# Patient Record
Sex: Female | Born: 1985 | Race: White | Hispanic: No | Marital: Married | Smoking: Current every day smoker
Health system: Southern US, Community
[De-identification: ages and names within clinical notes are randomized; demographics above are authoritative.]

## PROBLEM LIST (undated history)

## (undated) ENCOUNTER — Inpatient Hospital Stay (HOSPITAL_COMMUNITY): Payer: Self-pay

## (undated) DIAGNOSIS — I10 Essential (primary) hypertension: Secondary | ICD-10-CM

## (undated) DIAGNOSIS — E079 Disorder of thyroid, unspecified: Secondary | ICD-10-CM

## (undated) DIAGNOSIS — O10919 Unspecified pre-existing hypertension complicating pregnancy, unspecified trimester: Secondary | ICD-10-CM

## (undated) DIAGNOSIS — B20 Human immunodeficiency virus [HIV] disease: Secondary | ICD-10-CM

## (undated) DIAGNOSIS — E039 Hypothyroidism, unspecified: Secondary | ICD-10-CM

## (undated) DIAGNOSIS — F419 Anxiety disorder, unspecified: Secondary | ICD-10-CM

## (undated) DIAGNOSIS — F1011 Alcohol abuse, in remission: Secondary | ICD-10-CM

## (undated) DIAGNOSIS — Z21 Asymptomatic human immunodeficiency virus [HIV] infection status: Secondary | ICD-10-CM

## (undated) DIAGNOSIS — O10912 Unspecified pre-existing hypertension complicating pregnancy, second trimester: Secondary | ICD-10-CM

## (undated) DIAGNOSIS — O899 Complication of anesthesia during the puerperium, unspecified: Secondary | ICD-10-CM

## (undated) DIAGNOSIS — A539 Syphilis, unspecified: Secondary | ICD-10-CM

## (undated) DIAGNOSIS — R112 Nausea with vomiting, unspecified: Secondary | ICD-10-CM

## (undated) DIAGNOSIS — E669 Obesity, unspecified: Secondary | ICD-10-CM

## (undated) DIAGNOSIS — O24419 Gestational diabetes mellitus in pregnancy, unspecified control: Secondary | ICD-10-CM

## (undated) DIAGNOSIS — T4145XA Adverse effect of unspecified anesthetic, initial encounter: Secondary | ICD-10-CM

## (undated) DIAGNOSIS — T8859XA Other complications of anesthesia, initial encounter: Secondary | ICD-10-CM

## (undated) DIAGNOSIS — A6000 Herpesviral infection of urogenital system, unspecified: Secondary | ICD-10-CM

## (undated) DIAGNOSIS — Z9889 Other specified postprocedural states: Secondary | ICD-10-CM

## (undated) HISTORY — DX: Anxiety disorder, unspecified: F41.9

## (undated) HISTORY — DX: Herpesviral infection of urogenital system, unspecified: A60.00

## (undated) HISTORY — DX: Essential (primary) hypertension: I10

## (undated) HISTORY — DX: Gestational diabetes mellitus in pregnancy, unspecified control: O24.419

## (undated) HISTORY — PX: TUBAL LIGATION: SHX77

## (undated) HISTORY — DX: Alcohol abuse, in remission: F10.11

## (undated) HISTORY — DX: Obesity, unspecified: E66.9

## (undated) HISTORY — DX: Human immunodeficiency virus (HIV) disease: B20

## (undated) HISTORY — DX: Unspecified pre-existing hypertension complicating pregnancy, unspecified trimester: O10.919

## (undated) HISTORY — DX: Disorder of thyroid, unspecified: E07.9

## (undated) HISTORY — DX: Unspecified pre-existing hypertension complicating pregnancy, second trimester: O10.912

## (undated) HISTORY — DX: Asymptomatic human immunodeficiency virus (hiv) infection status: Z21

## (undated) HISTORY — DX: Syphilis, unspecified: A53.9

## (undated) HISTORY — PX: WISDOM TOOTH EXTRACTION: SHX21

---
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
## 2005-04-06 ENCOUNTER — Emergency Department (HOSPITAL_COMMUNITY): Admission: EM | Admit: 2005-04-06 | Discharge: 2005-04-07 | Payer: Self-pay | Admitting: Emergency Medicine

## 2005-10-22 ENCOUNTER — Encounter (INDEPENDENT_AMBULATORY_CARE_PROVIDER_SITE_OTHER): Payer: Self-pay | Admitting: *Deleted

## 2005-10-22 LAB — CONVERTED CEMR LAB

## 2005-10-23 ENCOUNTER — Inpatient Hospital Stay (HOSPITAL_COMMUNITY): Admission: AD | Admit: 2005-10-23 | Discharge: 2005-10-23 | Payer: Self-pay | Admitting: *Deleted

## 2005-11-06 DIAGNOSIS — B20 Human immunodeficiency virus [HIV] disease: Secondary | ICD-10-CM | POA: Insufficient documentation

## 2005-11-07 ENCOUNTER — Ambulatory Visit (HOSPITAL_COMMUNITY): Admission: RE | Admit: 2005-11-07 | Discharge: 2005-11-07 | Payer: Self-pay | Admitting: *Deleted

## 2005-11-23 ENCOUNTER — Ambulatory Visit: Payer: Self-pay | Admitting: Infectious Diseases

## 2005-11-23 ENCOUNTER — Encounter (INDEPENDENT_AMBULATORY_CARE_PROVIDER_SITE_OTHER): Payer: Self-pay | Admitting: *Deleted

## 2005-11-23 LAB — CONVERTED CEMR LAB
CD4 T Cell Abs: 390
HIV 1 RNA Quant: 2010 copies/mL

## 2005-11-29 ENCOUNTER — Ambulatory Visit: Payer: Self-pay | Admitting: Family Medicine

## 2005-11-30 ENCOUNTER — Ambulatory Visit: Payer: Self-pay | Admitting: *Deleted

## 2005-12-05 ENCOUNTER — Ambulatory Visit: Payer: Self-pay | Admitting: Infectious Diseases

## 2005-12-05 ENCOUNTER — Encounter (INDEPENDENT_AMBULATORY_CARE_PROVIDER_SITE_OTHER): Payer: Self-pay | Admitting: *Deleted

## 2005-12-13 ENCOUNTER — Ambulatory Visit: Payer: Self-pay | Admitting: Family Medicine

## 2005-12-18 ENCOUNTER — Ambulatory Visit: Payer: Self-pay | Admitting: *Deleted

## 2005-12-19 ENCOUNTER — Ambulatory Visit: Payer: Self-pay | Admitting: Family Medicine

## 2005-12-19 ENCOUNTER — Ambulatory Visit (HOSPITAL_COMMUNITY): Admission: RE | Admit: 2005-12-19 | Discharge: 2005-12-19 | Payer: Self-pay | Admitting: *Deleted

## 2005-12-27 ENCOUNTER — Ambulatory Visit: Payer: Self-pay | Admitting: Gynecology

## 2006-01-01 ENCOUNTER — Inpatient Hospital Stay (HOSPITAL_COMMUNITY): Admission: AD | Admit: 2006-01-01 | Discharge: 2006-01-01 | Payer: Self-pay | Admitting: Family Medicine

## 2006-01-03 ENCOUNTER — Ambulatory Visit: Payer: Self-pay | Admitting: Family Medicine

## 2006-01-03 ENCOUNTER — Inpatient Hospital Stay (HOSPITAL_COMMUNITY): Admission: AD | Admit: 2006-01-03 | Discharge: 2006-01-09 | Payer: Self-pay | Admitting: *Deleted

## 2006-01-03 ENCOUNTER — Ambulatory Visit: Payer: Self-pay | Admitting: *Deleted

## 2006-01-04 ENCOUNTER — Ambulatory Visit: Payer: Self-pay | Admitting: Infectious Diseases

## 2006-01-14 ENCOUNTER — Ambulatory Visit: Payer: Self-pay | Admitting: Family Medicine

## 2006-01-17 ENCOUNTER — Inpatient Hospital Stay (HOSPITAL_COMMUNITY): Admission: AD | Admit: 2006-01-17 | Discharge: 2006-01-17 | Payer: Self-pay | Admitting: Obstetrics & Gynecology

## 2006-01-17 ENCOUNTER — Ambulatory Visit: Payer: Self-pay | Admitting: Obstetrics and Gynecology

## 2006-01-21 ENCOUNTER — Ambulatory Visit (HOSPITAL_COMMUNITY): Admission: RE | Admit: 2006-01-21 | Discharge: 2006-01-21 | Payer: Self-pay | Admitting: *Deleted

## 2006-01-21 ENCOUNTER — Ambulatory Visit: Payer: Self-pay | Admitting: Family Medicine

## 2006-01-24 ENCOUNTER — Ambulatory Visit: Payer: Self-pay | Admitting: Family Medicine

## 2006-01-28 ENCOUNTER — Ambulatory Visit: Payer: Self-pay | Admitting: Family Medicine

## 2006-01-28 ENCOUNTER — Ambulatory Visit (HOSPITAL_COMMUNITY): Admission: RE | Admit: 2006-01-28 | Discharge: 2006-01-28 | Payer: Self-pay | Admitting: Obstetrics & Gynecology

## 2006-01-31 ENCOUNTER — Ambulatory Visit: Payer: Self-pay | Admitting: Obstetrics & Gynecology

## 2006-02-04 ENCOUNTER — Ambulatory Visit: Payer: Self-pay | Admitting: Obstetrics & Gynecology

## 2006-02-04 ENCOUNTER — Ambulatory Visit (HOSPITAL_COMMUNITY): Admission: RE | Admit: 2006-02-04 | Discharge: 2006-02-04 | Payer: Self-pay | Admitting: *Deleted

## 2006-02-05 ENCOUNTER — Ambulatory Visit: Payer: Self-pay | Admitting: Obstetrics and Gynecology

## 2006-02-07 ENCOUNTER — Ambulatory Visit: Payer: Self-pay | Admitting: Obstetrics & Gynecology

## 2006-02-14 ENCOUNTER — Ambulatory Visit: Payer: Self-pay | Admitting: Obstetrics & Gynecology

## 2006-02-18 ENCOUNTER — Inpatient Hospital Stay (HOSPITAL_COMMUNITY): Admission: AD | Admit: 2006-02-18 | Discharge: 2006-02-21 | Payer: Self-pay | Admitting: Obstetrics & Gynecology

## 2006-02-18 ENCOUNTER — Ambulatory Visit: Payer: Self-pay | Admitting: Gynecology

## 2006-02-19 ENCOUNTER — Encounter (INDEPENDENT_AMBULATORY_CARE_PROVIDER_SITE_OTHER): Payer: Self-pay | Admitting: Specialist

## 2006-02-25 ENCOUNTER — Ambulatory Visit: Payer: Self-pay | Admitting: Family Medicine

## 2006-02-28 ENCOUNTER — Ambulatory Visit: Payer: Self-pay | Admitting: Family Medicine

## 2006-03-03 ENCOUNTER — Inpatient Hospital Stay (HOSPITAL_COMMUNITY): Admission: AD | Admit: 2006-03-03 | Discharge: 2006-03-03 | Payer: Self-pay | Admitting: Obstetrics and Gynecology

## 2006-04-01 ENCOUNTER — Ambulatory Visit: Payer: Self-pay | Admitting: Family Medicine

## 2006-05-15 ENCOUNTER — Ambulatory Visit: Payer: Self-pay | Admitting: Sports Medicine

## 2006-05-22 ENCOUNTER — Ambulatory Visit: Payer: Self-pay | Admitting: Family Medicine

## 2006-06-05 ENCOUNTER — Ambulatory Visit: Payer: Self-pay | Admitting: Infectious Diseases

## 2006-06-05 ENCOUNTER — Encounter (INDEPENDENT_AMBULATORY_CARE_PROVIDER_SITE_OTHER): Payer: Self-pay | Admitting: *Deleted

## 2006-06-05 ENCOUNTER — Encounter: Admission: RE | Admit: 2006-06-05 | Discharge: 2006-06-05 | Payer: Self-pay | Admitting: Infectious Diseases

## 2006-11-12 DIAGNOSIS — O98519 Other viral diseases complicating pregnancy, unspecified trimester: Secondary | ICD-10-CM

## 2006-11-12 DIAGNOSIS — R87615 Unsatisfactory cytologic smear of cervix: Secondary | ICD-10-CM

## 2006-11-12 DIAGNOSIS — J329 Chronic sinusitis, unspecified: Secondary | ICD-10-CM | POA: Insufficient documentation

## 2006-11-21 ENCOUNTER — Ambulatory Visit: Payer: Self-pay | Admitting: Family Medicine

## 2006-12-06 DIAGNOSIS — I10 Essential (primary) hypertension: Secondary | ICD-10-CM

## 2006-12-06 DIAGNOSIS — E039 Hypothyroidism, unspecified: Secondary | ICD-10-CM | POA: Insufficient documentation

## 2006-12-16 ENCOUNTER — Encounter (INDEPENDENT_AMBULATORY_CARE_PROVIDER_SITE_OTHER): Payer: Self-pay | Admitting: *Deleted

## 2006-12-16 LAB — CONVERTED CEMR LAB

## 2006-12-20 ENCOUNTER — Encounter (INDEPENDENT_AMBULATORY_CARE_PROVIDER_SITE_OTHER): Payer: Self-pay | Admitting: *Deleted

## 2006-12-29 ENCOUNTER — Encounter (INDEPENDENT_AMBULATORY_CARE_PROVIDER_SITE_OTHER): Payer: Self-pay | Admitting: *Deleted

## 2006-12-30 ENCOUNTER — Telehealth (INDEPENDENT_AMBULATORY_CARE_PROVIDER_SITE_OTHER): Payer: Self-pay | Admitting: Infectious Diseases

## 2007-01-13 ENCOUNTER — Telehealth (INDEPENDENT_AMBULATORY_CARE_PROVIDER_SITE_OTHER): Payer: Self-pay | Admitting: *Deleted

## 2007-03-05 ENCOUNTER — Encounter: Admission: RE | Admit: 2007-03-05 | Discharge: 2007-03-05 | Payer: Self-pay | Admitting: Infectious Diseases

## 2007-03-05 ENCOUNTER — Ambulatory Visit: Payer: Self-pay | Admitting: Infectious Diseases

## 2007-03-05 LAB — CONVERTED CEMR LAB
ALT: 19 units/L (ref 0–35)
Alkaline Phosphatase: 68 units/L (ref 39–117)
BUN: 8 mg/dL (ref 6–23)
Creatinine, Ser: 0.67 mg/dL (ref 0.40–1.20)
HCT: 42.2 % (ref 36.0–46.0)
HIV 1 RNA Quant: <50 copies/mL (ref <50)
HIV-1 RNA Quant, Log: <1.7 (ref <1.70)
Hemoglobin: 13.5 g/dL (ref 12.0–15.0)
MCHC: 32 g/dL (ref 30.0–36.0)
MCV: 92.7 fL (ref 78.0–100.0)
RBC: 4.55 M/uL (ref 3.87–5.11)
TSH: 1.516 microintl units/mL (ref 0.350–5.50)
Total Protein: 7.5 g/dL (ref 6.0–8.3)
VLDL: 44 mg/dL — ABNORMAL HIGH (ref 0–40)
WBC: 5.9 10*3/uL (ref 4.0–10.5)

## 2007-03-10 ENCOUNTER — Telehealth: Payer: Self-pay | Admitting: *Deleted

## 2007-10-20 ENCOUNTER — Encounter: Payer: Self-pay | Admitting: Infectious Diseases

## 2007-11-24 ENCOUNTER — Encounter (INDEPENDENT_AMBULATORY_CARE_PROVIDER_SITE_OTHER): Payer: Self-pay | Admitting: *Deleted

## 2007-11-25 ENCOUNTER — Encounter: Admission: RE | Admit: 2007-11-25 | Discharge: 2007-11-25 | Payer: Self-pay | Admitting: Infectious Diseases

## 2007-11-25 ENCOUNTER — Ambulatory Visit: Payer: Self-pay | Admitting: Infectious Diseases

## 2007-11-25 LAB — CONVERTED CEMR LAB
Albumin: 4 g/dL (ref 3.5–5.2)
Alkaline Phosphatase: 65 units/L (ref 39–117)
CO2: 24 meq/L (ref 19–32)
Chloride: 105 meq/L (ref 96–112)
Cholesterol: 175 mg/dL (ref 0–200)
Glucose, Bld: 105 mg/dL — ABNORMAL HIGH (ref 70–99)
HDL: 37 mg/dL — ABNORMAL LOW (ref >39)
Hemoglobin: 13.4 g/dL (ref 12.0–15.0)
MCHC: 33.3 g/dL (ref 30.0–36.0)
Platelets: 247 10*3/uL (ref 150–400)
Potassium: 4.1 meq/L (ref 3.5–5.3)
RBC: 4.51 M/uL (ref 3.87–5.11)
RDW: 13.9 % (ref 11.5–15.5)
Total Bilirubin: 0.4 mg/dL (ref 0.3–1.2)
Total CHOL/HDL Ratio: 4.7
VLDL: 60 mg/dL — ABNORMAL HIGH (ref 0–40)
WBC: 5.8 10*3/uL (ref 4.0–10.5)

## 2007-11-26 ENCOUNTER — Encounter (INDEPENDENT_AMBULATORY_CARE_PROVIDER_SITE_OTHER): Payer: Self-pay | Admitting: *Deleted

## 2008-01-16 ENCOUNTER — Encounter: Payer: Self-pay | Admitting: Infectious Diseases

## 2008-01-16 ENCOUNTER — Ambulatory Visit: Payer: Self-pay | Admitting: Infectious Diseases

## 2008-01-23 ENCOUNTER — Encounter: Payer: Self-pay | Admitting: Infectious Diseases

## 2008-01-25 ENCOUNTER — Emergency Department (HOSPITAL_COMMUNITY): Admission: EM | Admit: 2008-01-25 | Discharge: 2008-01-25 | Payer: Self-pay | Admitting: Family Medicine

## 2008-02-04 ENCOUNTER — Ambulatory Visit: Payer: Self-pay | Admitting: Infectious Diseases

## 2008-02-04 ENCOUNTER — Encounter: Admission: RE | Admit: 2008-02-04 | Discharge: 2008-02-04 | Payer: Self-pay | Admitting: Infectious Diseases

## 2008-02-04 LAB — CONVERTED CEMR LAB
ALT: 16 units/L (ref 0–35)
AST: 16 units/L (ref 0–37)
Basophils Absolute: 0 10*3/uL (ref 0.0–0.1)
Basophils Relative: 1 % (ref 0–1)
CO2: 27 meq/L (ref 19–32)
Calcium: 8.9 mg/dL (ref 8.4–10.5)
Chloride: 103 meq/L (ref 96–112)
Creatinine, Ser: 0.67 mg/dL (ref 0.40–1.20)
Eosinophils Absolute: 0.2 10*3/uL (ref 0.0–0.7)
Eosinophils Relative: 3 % (ref 0–5)
HCT: 39.9 % (ref 36.0–46.0)
Hemoglobin: 12.8 g/dL (ref 12.0–15.0)
MCHC: 32.1 g/dL (ref 30.0–36.0)
MCV: 88.3 fL (ref 78.0–100.0)
Potassium: 4.6 meq/L (ref 3.5–5.3)
RBC: 4.52 M/uL (ref 3.87–5.11)
RDW: 14.4 % (ref 11.5–15.5)
Sodium: 143 meq/L (ref 135–145)
Total Protein: 7.3 g/dL (ref 6.0–8.3)
WBC: 6.5 10*3/uL (ref 4.0–10.5)

## 2008-02-24 ENCOUNTER — Encounter: Admission: RE | Admit: 2008-02-24 | Discharge: 2008-02-24 | Payer: Self-pay | Admitting: Infectious Diseases

## 2008-02-24 ENCOUNTER — Ambulatory Visit: Payer: Self-pay | Admitting: Infectious Diseases

## 2008-02-24 LAB — CONVERTED CEMR LAB
Albumin: 4 g/dL (ref 3.5–5.2)
Alkaline Phosphatase: 56 units/L (ref 39–117)
BUN: 10 mg/dL (ref 6–23)
CO2: 26 meq/L (ref 19–32)
Glucose, Bld: 101 mg/dL — ABNORMAL HIGH (ref 70–99)
HIV 1 RNA Quant: 144 copies/mL — ABNORMAL HIGH (ref <50)
HIV-1 antibody: POSITIVE — AB
HIV-2 Ab: UNDETERMINED — AB
HIV: REACTIVE
Hemoglobin: 13.1 g/dL (ref 12.0–15.0)
MCHC: 32.3 g/dL (ref 30.0–36.0)
MCV: 86.9 fL (ref 78.0–100.0)
Potassium: 4.2 meq/L (ref 3.5–5.3)
RBC: 4.67 M/uL (ref 3.87–5.11)
Sodium: 142 meq/L (ref 135–145)
Total Protein: 7.2 g/dL (ref 6.0–8.3)
WBC: 6.1 10*3/uL (ref 4.0–10.5)

## 2008-03-02 ENCOUNTER — Ambulatory Visit: Payer: Self-pay | Admitting: Infectious Diseases

## 2008-03-19 ENCOUNTER — Emergency Department (HOSPITAL_COMMUNITY): Admission: EM | Admit: 2008-03-19 | Discharge: 2008-03-19 | Payer: Self-pay | Admitting: Emergency Medicine

## 2008-04-19 ENCOUNTER — Emergency Department (HOSPITAL_COMMUNITY): Admission: EM | Admit: 2008-04-19 | Discharge: 2008-04-19 | Payer: Self-pay | Admitting: Emergency Medicine

## 2008-06-07 ENCOUNTER — Ambulatory Visit: Payer: Self-pay | Admitting: Infectious Diseases

## 2008-06-07 ENCOUNTER — Encounter: Admission: RE | Admit: 2008-06-07 | Discharge: 2008-06-07 | Payer: Self-pay | Admitting: Infectious Diseases

## 2008-06-07 LAB — CONVERTED CEMR LAB
ALT: 24 units/L (ref 0–35)
AST: 20 units/L (ref 0–37)
Basophils Absolute: 0 10*3/uL (ref 0.0–0.1)
Basophils Relative: 1 % (ref 0–1)
CO2: 25 meq/L (ref 19–32)
Calcium: 8.6 mg/dL (ref 8.4–10.5)
Chloride: 102 meq/L (ref 96–112)
Creatinine, Ser: 0.72 mg/dL (ref 0.40–1.20)
HIV 1 RNA Quant: 1130 copies/mL — ABNORMAL HIGH (ref <50)
Hemoglobin: 13.5 g/dL (ref 12.0–15.0)
Lymphocytes Relative: 38 % (ref 12–46)
MCHC: 33.4 g/dL (ref 30.0–36.0)
Neutro Abs: 3 10*3/uL (ref 1.7–7.7)
Neutrophils Relative %: 51 % (ref 43–77)
Platelets: 185 10*3/uL (ref 150–400)
Potassium: 4.3 meq/L (ref 3.5–5.3)
RDW: 15.5 % (ref 11.5–15.5)
Sodium: 140 meq/L (ref 135–145)
Total Protein: 7.2 g/dL (ref 6.0–8.3)

## 2009-01-19 ENCOUNTER — Encounter: Payer: Self-pay | Admitting: Infectious Diseases

## 2009-03-23 ENCOUNTER — Emergency Department (HOSPITAL_COMMUNITY): Admission: EM | Admit: 2009-03-23 | Discharge: 2009-03-23 | Payer: Self-pay | Admitting: *Deleted

## 2009-04-21 ENCOUNTER — Ambulatory Visit: Payer: Self-pay | Admitting: Infectious Diseases

## 2009-04-21 LAB — CONVERTED CEMR LAB
AST: 22 units/L (ref 0–37)
Albumin: 3.9 g/dL (ref 3.5–5.2)
Alkaline Phosphatase: 50 units/L (ref 39–117)
BUN: 7 mg/dL (ref 6–23)
Calcium: 8.2 mg/dL — ABNORMAL LOW (ref 8.4–10.5)
Creatinine, Ser: 0.66 mg/dL (ref 0.40–1.20)
Eosinophils Relative: 2 % (ref 0–5)
Glucose, Bld: 109 mg/dL — ABNORMAL HIGH (ref 70–99)
HCT: 38.5 % (ref 36.0–46.0)
HIV 1 RNA Quant: 908 copies/mL — ABNORMAL HIGH (ref <48)
HIV-1 RNA Quant, Log: 2.96 — ABNORMAL HIGH (ref <1.68)
Hemoglobin: 12.6 g/dL (ref 12.0–15.0)
Lymphocytes Relative: 27 % (ref 12–46)
MCHC: 32.7 g/dL (ref 30.0–36.0)
Monocytes Absolute: 0.3 10*3/uL (ref 0.1–1.0)
Monocytes Relative: 7 % (ref 3–12)
Neutro Abs: 2.9 10*3/uL (ref 1.7–7.7)
Potassium: 3.9 meq/L (ref 3.5–5.3)
RBC: 4.51 M/uL (ref 3.87–5.11)

## 2010-03-27 ENCOUNTER — Inpatient Hospital Stay (HOSPITAL_COMMUNITY): Admission: AD | Admit: 2010-03-27 | Discharge: 2010-03-27 | Payer: Self-pay | Admitting: Family Medicine

## 2010-03-30 ENCOUNTER — Ambulatory Visit: Payer: Self-pay | Admitting: Infectious Diseases

## 2010-03-30 ENCOUNTER — Encounter (INDEPENDENT_AMBULATORY_CARE_PROVIDER_SITE_OTHER): Payer: Self-pay | Admitting: Licensed Clinical Social Worker

## 2010-04-17 ENCOUNTER — Ambulatory Visit: Payer: Self-pay | Admitting: Infectious Diseases

## 2010-04-17 LAB — CONVERTED CEMR LAB
Nitrite: NEGATIVE
pH: 5

## 2010-04-19 ENCOUNTER — Ambulatory Visit: Payer: Self-pay | Admitting: Obstetrics and Gynecology

## 2010-04-19 ENCOUNTER — Encounter (INDEPENDENT_AMBULATORY_CARE_PROVIDER_SITE_OTHER): Payer: Self-pay | Admitting: *Deleted

## 2010-04-19 ENCOUNTER — Ambulatory Visit: Payer: Self-pay | Admitting: Infectious Diseases

## 2010-04-19 LAB — CONVERTED CEMR LAB
Basophils Absolute: 0 10*3/uL (ref 0.0–0.1)
Basophils Relative: 0 % (ref 0–1)
Clue Cells Wet Prep HPF POC: NONE SEEN
Eosinophils Absolute: 0.1 10*3/uL (ref 0.0–0.7)
Free T4: 1.09 ng/dL (ref 0.80–1.80)
GC Probe Amp, Urine: NEGATIVE
MCHC: 31.7 g/dL (ref 30.0–36.0)
MCV: 88.4 fL (ref 78.0–100.0)
Neutro Abs: 4.2 10*3/uL (ref 1.7–7.7)
Neutrophils Relative %: 68 % (ref 43–77)
Pap Smear: NEGATIVE
Platelets: 194 10*3/uL (ref 150–400)
T3, Free: 2.6 pg/mL (ref 2.3–4.2)
TSH: 2.592 microintl units/mL (ref 0.350–4.500)
Trich, Wet Prep: NONE SEEN
Yeast Wet Prep HPF POC: NONE SEEN

## 2010-04-20 ENCOUNTER — Ambulatory Visit (HOSPITAL_COMMUNITY): Admission: RE | Admit: 2010-04-20 | Discharge: 2010-04-20 | Payer: Self-pay | Admitting: Obstetrics & Gynecology

## 2010-04-26 ENCOUNTER — Encounter (INDEPENDENT_AMBULATORY_CARE_PROVIDER_SITE_OTHER): Payer: Self-pay | Admitting: *Deleted

## 2010-05-04 ENCOUNTER — Ambulatory Visit: Payer: Self-pay | Admitting: Family Medicine

## 2010-05-18 ENCOUNTER — Ambulatory Visit: Payer: Self-pay | Admitting: Obstetrics and Gynecology

## 2010-05-18 ENCOUNTER — Encounter (INDEPENDENT_AMBULATORY_CARE_PROVIDER_SITE_OTHER): Payer: Self-pay | Admitting: Family Medicine

## 2010-05-18 LAB — CONVERTED CEMR LAB
MCV: 87.9 fL (ref 78.0–100.0)
Platelets: 218 10*3/uL (ref 150–400)
WBC: 7.5 10*3/uL (ref 4.0–10.5)

## 2010-05-20 ENCOUNTER — Inpatient Hospital Stay (HOSPITAL_COMMUNITY): Admission: AD | Admit: 2010-05-20 | Discharge: 2010-05-20 | Payer: Self-pay | Admitting: Obstetrics & Gynecology

## 2010-05-20 ENCOUNTER — Ambulatory Visit: Payer: Self-pay | Admitting: Nurse Practitioner

## 2010-05-25 ENCOUNTER — Ambulatory Visit: Payer: Self-pay | Admitting: Obstetrics and Gynecology

## 2010-05-25 ENCOUNTER — Ambulatory Visit (HOSPITAL_COMMUNITY): Admission: RE | Admit: 2010-05-25 | Discharge: 2010-05-25 | Payer: Self-pay | Admitting: Obstetrics & Gynecology

## 2010-05-31 ENCOUNTER — Encounter: Payer: Self-pay | Admitting: Family Medicine

## 2010-05-31 ENCOUNTER — Encounter: Payer: Self-pay | Admitting: Obstetrics and Gynecology

## 2010-05-31 LAB — CONVERTED CEMR LAB
AST: 11 units/L (ref 0–37)
Albumin: 3.2 g/dL — ABNORMAL LOW (ref 3.5–5.2)
BUN: 6 mg/dL (ref 6–23)
Calcium: 8.8 mg/dL (ref 8.4–10.5)
Chloride: 104 meq/L (ref 96–112)
Creatinine, Urine: 104.2 mg/dL
Hemoglobin: 10.9 g/dL — ABNORMAL LOW (ref 12.0–15.0)
Potassium: 3.9 meq/L (ref 3.5–5.3)
RBC: 3.86 M/uL — ABNORMAL LOW (ref 3.87–5.11)
RDW: 15.1 % (ref 11.5–15.5)
WBC: 5.3 10*3/uL (ref 4.0–10.5)

## 2010-06-08 ENCOUNTER — Ambulatory Visit: Payer: Self-pay | Admitting: Obstetrics and Gynecology

## 2010-06-09 ENCOUNTER — Telehealth (INDEPENDENT_AMBULATORY_CARE_PROVIDER_SITE_OTHER): Payer: Self-pay | Admitting: *Deleted

## 2010-06-12 ENCOUNTER — Encounter: Payer: Self-pay | Admitting: Infectious Diseases

## 2010-06-16 ENCOUNTER — Ambulatory Visit (HOSPITAL_COMMUNITY): Admission: RE | Admit: 2010-06-16 | Discharge: 2010-06-16 | Payer: Self-pay | Admitting: Family Medicine

## 2010-07-31 ENCOUNTER — Ambulatory Visit: Payer: Self-pay | Admitting: Obstetrics & Gynecology

## 2010-07-31 ENCOUNTER — Inpatient Hospital Stay (HOSPITAL_COMMUNITY): Admission: RE | Admit: 2010-07-31 | Discharge: 2010-08-03 | Payer: Self-pay | Admitting: Obstetrics & Gynecology

## 2010-10-04 ENCOUNTER — Encounter: Payer: Self-pay | Admitting: Family Medicine

## 2010-10-04 ENCOUNTER — Ambulatory Visit: Payer: Self-pay | Admitting: Obstetrics and Gynecology

## 2010-10-04 LAB — CONVERTED CEMR LAB
HCT: 37.1 % (ref 36.0–46.0)
Hemoglobin: 12 g/dL (ref 12.0–15.0)
MCHC: 32.3 g/dL (ref 30.0–36.0)
MCV: 84.5 fL (ref 78.0–100.0)
RBC: 4.39 M/uL (ref 3.87–5.11)
RDW: 15.1 % (ref 11.5–15.5)

## 2010-10-18 ENCOUNTER — Ambulatory Visit: Payer: Self-pay | Admitting: Obstetrics & Gynecology

## 2010-11-12 ENCOUNTER — Encounter: Payer: Self-pay | Admitting: *Deleted

## 2010-11-13 ENCOUNTER — Encounter (INDEPENDENT_AMBULATORY_CARE_PROVIDER_SITE_OTHER): Payer: Self-pay | Admitting: *Deleted

## 2010-11-17 ENCOUNTER — Encounter (INDEPENDENT_AMBULATORY_CARE_PROVIDER_SITE_OTHER): Payer: Self-pay | Admitting: *Deleted

## 2010-11-19 LAB — CONVERTED CEMR LAB
Albumin: 3.3 g/dL — ABNORMAL LOW (ref 3.5–5.2)
Alkaline Phosphatase: 45 units/L (ref 39–117)
CO2: 20 meq/L (ref 19–32)
Calcium: 8.5 mg/dL (ref 8.4–10.5)
Chloride: 104 meq/L (ref 96–112)
Glucose, Bld: 86 mg/dL (ref 70–99)
HCT: 34.9 % — ABNORMAL LOW (ref 36.0–46.0)
HIV 1 RNA Quant: 881 copies/mL — ABNORMAL HIGH (ref <48)
Hemoglobin: 12 g/dL (ref 12.0–15.0)
Lymphocytes Relative: 22 % (ref 12–46)
Lymphs Abs: 1.3 10*3/uL (ref 0.7–4.0)
Monocytes Absolute: 0.4 10*3/uL (ref 0.1–1.0)
Monocytes Relative: 7 % (ref 3–12)
Neutro Abs: 4.2 10*3/uL (ref 1.7–7.7)
Potassium: 4.3 meq/L (ref 3.5–5.3)
RBC: 4.09 M/uL (ref 3.87–5.11)
Sodium: 135 meq/L (ref 135–145)
Total Protein: 6.7 g/dL (ref 6.0–8.3)

## 2010-11-23 NOTE — Assessment & Plan Note (Signed)
Summary: 125  mg Rochepin injection/dde   Vitals Entered By: Jennet Maduro RN (April 19, 2010 3:46 PM)  Medical History Prior Medications: PRENATAL VITAMINS 0.8 MG TABS (PRENATAL MULTIVIT-MIN-FE-FA) Take 1 tablet by mouth once a day KALETRA 200-50 MG TABS (LOPINAVIR-RITONAVIR) 2 tab by mouth two times a day COMBIVIR 150-300 MG TABS (LAMIVUDINE-ZIDOVUDINE) Take 1 tablet by mouth two times a day Current Allergies: ! STEROIDS ! SULFA Medication Administration  Injection # 1:    Medication: Rocephin  250mg     Diagnosis: CONTACT WITH OR EXPOSURE TO VENEREAL DISEASES (ICD-V01.6)    Route: IM    Site: RUOQ gluteus    Exp Date: 09/22/2011    Lot #: [id number]    Mfr: sandoz    Comments: 125 mg of Rocephin per Dr. Ninetta Lights    Patient tolerated injection without complications    Given by: Jennet Maduro RN (April 19, 2010 3:49 PM)  Orders Added: 1)  Rocephin  250mg  [J0696] 2)  Admin of Therapeutic Inj  intramuscular or subcutaneous [96372]   Medication Administration  Injection # 1:    Medication: Rocephin  250mg     Diagnosis: CONTACT WITH OR EXPOSURE TO VENEREAL DISEASES (ICD-V01.6)    Route: IM    Site: RUOQ gluteus    Exp Date: 09/22/2011    Lot #: [id number]    Mfr: sandoz    Comments: 125 mg of Rocephin per Dr. Ninetta Lights    Patient tolerated injection without complications    Given by: Jennet Maduro RN (April 19, 2010 3:49 PM)  Orders Added: 1)  Rocephin  250mg  [J0696] 2)  Admin of Therapeutic Inj  intramuscular or subcutaneous [[id number]]

## 2010-11-23 NOTE — Miscellaneous (Signed)
  Clinical Lists Changes  Observations: Added new observation of #CHILDDEL: 1  (11/13/2010 14:59) Added new observation of DELIV TYPE: Cesarean  (11/13/2010 14:59)

## 2010-11-23 NOTE — Miscellaneous (Signed)
  Clinical Lists Changes 

## 2010-11-23 NOTE — Miscellaneous (Signed)
Summary: PAP Smear results 04/19/2010 [facility]  Clinical Lists Changes  Observations: Added new observation of PAP SMEAR: NEGATIVE (04/19/2010 9:44) Added new observation of LAST PAP DAT: 04/19/2010 (04/19/2010 9:44)

## 2010-11-23 NOTE — Miscellaneous (Signed)
Summary: Orders Update  Clinical Lists Changes  Orders: Added new Test order of T-CBC w/Diff [phone]) - Signed Added new Test order of T-CD4SP [facility]) (CD4SP) - Signed Added new Test order of T-Comprehensive Metabolic Panel [phone]) - Signed Added new Test order of T-HIV Viral Load [phone]) - Signed Added new Test order of T-Urinalysis ([id number]) - Signed Added new Test order of T-RPR (Syphilis) [phone]) - Signed Added new Test order of T-Chlamydia  Probe, urine [phone]) - Signed Added new Test order of T-GC Probe, urine [phone]) - Signed

## 2010-11-23 NOTE — Miscellaneous (Signed)
Summary: [facility]  [facility]   Imported By: Florinda Marker 06/12/2010 14:49:15  _____________________________________________________________________  External Attachment:    Type:   Image     Comment:   External Document

## 2010-11-23 NOTE — Progress Notes (Signed)
Summary: [facility] requesting OV notes and labs throughout Preg.  Phone Note From Other Clinic   Caller: [facility] Reason for Call: Need Referral Information Summary of Call: Requesting OV notes and Labs during pt. pregnancy.  Previous OV notes and Labs faxed.  To continue throughout pregancy.  Fax # [phone].  Jennet Maduro RN  June 09, 2010 9:00 AM

## 2010-11-23 NOTE — Assessment & Plan Note (Signed)
Summary: F/U [MKJ]   CC:  follow-up visit.  History of Present Illness: 25 yo F with morbid obesity and HIV+. She was dx 1-07 when she was pregnant. She delivered a HIV- infant. Was treated with combivir and kaletra around delivery, since was d/c 2-09.  She has been out of care for several years.   Labs done 02-04-08  CD4 620 VL 76 Current labs CD4 330 and VL 881 (03-30-10). Today states she is pregnant. SHe believes she is 24-25 weeks. feeling ok, lost job 2 weeks ago. has been of ART for a long time. wt down 25# since last visit. has GYN apt 04-19-10. her current sex partner had syphillis. she has gotten IM PEN. She is planning on getting married to him.   Preventive Screening-Counseling & Management  Alcohol-Tobacco     Alcohol drinks/day: 0     Smoking Status: current     Packs/Day: <0.25     Year Started: 2004  Caffeine-Diet-Exercise     Caffeine use/day: sodas occassionally     Does Patient Exercise: no     Type of exercise: walking/active around house  Safety-Violence-Falls     Seat Belt Use: yes   Updated Prior Medication List: No Medications Current Allergies (reviewed today): ! STEROIDS ! SULFA Past History:  Past Medical History: HIV+ gestational diabetes syphillis exposure 2011 hypertension  Social History: Lives with son Eileen [name] (born in '07)  and mother (Else plans on moving out soon).  Smokes 5 cigs/day.  Does not exercise or follow healthy diet. has quit drinking.   Vital Signs:  Patient profile:   25 year old female Height:      70 inches (177.80 cm) Weight:      432.12 pounds (196.42 kg) BMI:     62.23 Temp:     98.3 degrees F (36.83 degrees C) oral Pulse rate:   96 / minute BP sitting:   160 / 97  (right arm)  Vitals Entered By: Baxter Hire) (April 17, 2010 2:45 PM) CC: follow-up visit Pain Assessment Patient in pain? no      Nutritional Status BMI of > 30 = obese Nutritional Status Detail appetite is okay per patient  Does  patient need assistance? Functional Status Self care Ambulation Normal   Physical Exam  General:  overweight-appearing.  grossly obese Eyes:  pupils equal, pupils round, and pupils reactive to light.   Mouth:  pharynx pink and moist and no exudates.   Neck:  no masses.   Lungs:  normal respiratory effort and normal breath sounds.   Heart:  normal rate, regular rhythm, and no murmur.   Abdomen:  soft, non-tender, and normal bowel sounds.          Medication Adherence: 04/17/2010   Adherence to medications reviewed with patient. Counseling to provide adequate adherence provided   Prevention For Positives: 04/17/2010   Safe sex practices discussed with patient. Condoms offered.                             Impression & Recommendations:  Problem # 1:  HIV INFECTION (ICD-042)  will restart her on ART. she has progressed to the point that she will need to be on ART even when she is not pregnant. she is offered condoms. await her GC/chlamydia test. check genotype today. return to clinic 14month  Orders: T-HIV Genotype ([id number]) Est. Patient Level V (78469)  Problem # 2:  MORBID OBESITY (ICD-278.01) this  may be her most damaging illness. will work on wt loss strategies when she is not pregnant. encourage her to exercise when pregnant.   Problem # 3:  HYPERTENSION (ICD-401.9) she is off rx. will ask high risk OB to start meds. she is NOT on ACE-I The following medications were removed from the medication list:    Zestril 10 Mg Tabs (Lisinopril) .Marland Kitchen... Take 1 tablet by mouth once a day  Problem # 4:  HYPOTHYROIDISM (ICD-244.9) she is currently off meds.  The following medications were removed from the medication list:    Synthroid 50 Mcg Tabs (Levothyroxine sodium) .Marland Kitchen... Take 1 tablet by mouth once a day  Problem # 5:  INTRAUTERINE PREGNANCY (ICD-V22.2) she will be seen in high risk ob on 04-19-10.   Problem # 6:  ABFND PAP SMEAR, UNSATISFACTORY (ICD-795.08) will have  f/u PAP at high risk.  Medications Added to Medication List This Visit: 1)  Prenatal Vitamins 0.8 Mg Tabs (Prenatal multivit-min-fe-fa) .... Take 1 tablet by mouth once a day 2)  Kaletra 200-50 Mg Tabs (Lopinavir-ritonavir) .... 2 tab by mouth two times a day 3)  Combivir 150-300 Mg Tabs (Lamivudine-zidovudine) .... Take 1 tablet by mouth two times a day Prescriptions: KALETRA 200-50 MG TABS (LOPINAVIR-RITONAVIR) 2 tab by mouth two times a day  #120 x 3   Entered and Authorized by:   Johny Sax MD   Signed by:   Johny Sax MD on 04/17/2010   Method used:   Print then Give to Patient   RxID:   [id number] COMBIVIR 150-300 MG TABS (LAMIVUDINE-ZIDOVUDINE) Take 1 tablet by mouth two times a day  #60 x 3   Entered and Authorized by:   Johny Sax MD   Signed by:   Johny Sax MD on 04/17/2010   Method used:   Print then Give to Patient   RxID:   [id number] PRENATAL VITAMINS 0.8 MG TABS (PRENATAL MULTIVIT-MIN-FE-FA) Take 1 tablet by mouth once a day  #30 x 3   Entered and Authorized by:   Johny Sax MD   Signed by:   Johny Sax MD on 04/17/2010   Method used:   Print then Give to Patient   RxID:   [id number]

## 2010-11-23 NOTE — Miscellaneous (Signed)
Summary: Orders Update  Clinical Lists Changes  Orders: Added new Test order of T-Urinalysis Dipstick only (13086VH) - Signed Observations: Added new observation of WBC DIPSTK U: trace (04/17/2010 14:57) Added new observation of UROBILINOGEN: 0.2  (04/17/2010 14:57) Added new observation of PH URINE: 5.0  (04/17/2010 14:57) Added new observation of SPEC GR URIN: >=1.030  (04/17/2010 14:57) Added new observation of BILIRUBIN UR: small  (04/17/2010 14:57) Added new observation of APPEARANCE U: Cloudy  (04/17/2010 14:57) Added new observation of NITRITE URN: negative  (04/17/2010 14:57) Added new observation of PROTEIN, URN: 30  (04/17/2010 14:57) Added new observation of BLOOD UR DIP: negative  (04/17/2010 14:57) Added new observation of KETONES URN: trace (5)  (04/17/2010 14:57) Added new observation of GLUCOSE, URN: negative  (04/17/2010 14:57) Added new observation of UA COLOR: orange  (04/17/2010 14:57)      Laboratory Results   Urine Tests  Date/Time Received: Mariea Clonts  April 17, 2010 3:19 PM   Date/Time Reported: Mariea Clonts  April 17, 2010 3:19 PM   Routine Urinalysis   Color: orange Appearance: Cloudy Glucose: negative   (Normal Range: Negative) Bilirubin: small   (Normal Range: Negative) Ketone: trace (5)   (Normal Range: Negative) Spec. Gravity: >=1.030   (Normal Range: 1.003-1.035) Blood: negative   (Normal Range: Negative) pH: 5.0   (Normal Range: 5.0-8.0) Protein: 30   (Normal Range: Negative) Urobilinogen: 0.2   (Normal Range: 0-1) Nitrite: negative   (Normal Range: Negative) Leukocyte Esterace: trace   (Normal Range: Negative)

## 2010-12-12 ENCOUNTER — Encounter (INDEPENDENT_AMBULATORY_CARE_PROVIDER_SITE_OTHER): Payer: Self-pay | Admitting: Licensed Clinical Social Worker

## 2010-12-19 NOTE — Miscellaneous (Signed)
  Clinical Lists Changes  Observations: Added new observation of FLU VAX: Historical (06/22/2010 10:20)        Immunization History:  Influenza Immunization History:    Influenza:  historical (06/22/2010)

## 2011-01-01 ENCOUNTER — Other Ambulatory Visit: Payer: Self-pay

## 2011-01-02 ENCOUNTER — Other Ambulatory Visit: Payer: Self-pay

## 2011-01-02 ENCOUNTER — Encounter: Payer: Self-pay | Admitting: Infectious Diseases

## 2011-01-04 LAB — CBC
HCT: 33.2 % — ABNORMAL LOW (ref 36.0–46.0)
Hemoglobin: 11.4 g/dL — ABNORMAL LOW (ref 12.0–15.0)
Hemoglobin: 9.6 g/dL — ABNORMAL LOW (ref 12.0–15.0)
MCH: 29.9 pg (ref 26.0–34.0)
MCH: 30 pg (ref 26.0–34.0)
MCHC: 34.5 g/dL (ref 30.0–36.0)
MCV: 86.7 fL (ref 78.0–100.0)
MCV: 86.8 fL (ref 78.0–100.0)
Platelets: 168 10*3/uL (ref 150–400)
RBC: 3.21 MIL/uL — ABNORMAL LOW (ref 3.87–5.11)
RDW: 16.9 % — ABNORMAL HIGH (ref 11.5–15.5)

## 2011-01-04 LAB — SURGICAL PCR SCREEN: MRSA, PCR: NEGATIVE

## 2011-01-05 LAB — POCT URINALYSIS DIPSTICK
Hgb urine dipstick: NEGATIVE
Hgb urine dipstick: NEGATIVE
Nitrite: NEGATIVE
Protein, ur: NEGATIVE mg/dL
Specific Gravity, Urine: 1.015 (ref 1.005–1.030)
Urobilinogen, UA: 0.2 mg/dL (ref 0.0–1.0)
Urobilinogen, UA: 0.2 mg/dL (ref 0.0–1.0)
pH: 5.5 (ref 5.0–8.0)

## 2011-01-06 LAB — POCT URINALYSIS DIP (DEVICE)
Nitrite: NEGATIVE
Protein, ur: 30 mg/dL — AB
pH: 5.5 (ref 5.0–8.0)

## 2011-01-06 LAB — WET PREP, GENITAL

## 2011-01-06 LAB — GC/CHLAMYDIA PROBE AMP, GENITAL
Chlamydia, DNA Probe: NEGATIVE
GC Probe Amp, Genital: NEGATIVE

## 2011-01-06 LAB — CBC
MCHC: 34.3 g/dL (ref 30.0–36.0)
RDW: 14.4 % (ref 11.5–15.5)

## 2011-01-07 LAB — POCT URINALYSIS DIP (DEVICE)
Glucose, UA: NEGATIVE mg/dL
Hgb urine dipstick: NEGATIVE
Ketones, ur: NEGATIVE mg/dL
Protein, ur: NEGATIVE mg/dL
Specific Gravity, Urine: >=1.03 (ref 1.005–1.030)
Specific Gravity, Urine: >=1.03 (ref 1.005–1.030)
Urobilinogen, UA: 0.2 mg/dL (ref 0.0–1.0)
pH: 5.5 (ref 5.0–8.0)

## 2011-01-08 LAB — T-HELPER CELL (CD4) - (RCID CLINIC ONLY): CD4 % Helper T Cell: 28 % — ABNORMAL LOW (ref 33–55)

## 2011-01-15 ENCOUNTER — Ambulatory Visit: Payer: Self-pay | Admitting: Infectious Diseases

## 2011-01-16 ENCOUNTER — Ambulatory Visit: Payer: Self-pay | Admitting: Infectious Diseases

## 2011-01-28 LAB — T-HELPER CELL (CD4) - (RCID CLINIC ONLY)
CD4 % Helper T Cell: 31 % — ABNORMAL LOW (ref 33–55)
CD4 T Cell Abs: 390 uL — ABNORMAL LOW (ref 400–2700)

## 2011-01-29 LAB — POCT PREGNANCY, URINE: Preg Test, Ur: NEGATIVE

## 2011-03-09 NOTE — Op Note (Signed)
Eileen [name], Eileen [name] NO.:  [id number]   MEDICAL RECORD NO.:  [id number]          PATIENT TYPE:  INP   LOCATION:  9374                          FACILITY:  [facility]   PHYSICIAN:  Ginger Carne, MD  DATE OF BIRTH:  1986-09-29   DATE OF PROCEDURE:  02/18/2006  DATE OF DISCHARGE:                                 OPERATIVE REPORT   PREOPERATIVE DIAGNOSIS:  Failure to progress, first stage of labor.   POSTOPERATIVE DIAGNOSES:  1.  Failure to progress, first stage of labor.  2.  Term viable delivery of female infant.   PROCEDURE:  Primary low transverse cesarean section.   SURGEON:  Ginger Carne, M.D.   ASSISTANT:  Alanson Puls, M.D.   ESTIMATED BLOOD LOSS:  500 mL.   COMPLICATIONS:  None immediate.   ANESTHESIA:  Epidural top-off.   SPECIMEN:  Cord blood, cord pH.   DRAINS:  Jackson-Pratt subcutaneously,   OPERATIVE FINDINGS:  Term infant female delivered on February 18, 2006, Apgar per  delivery room record, weight 8 pounds 11 ounces, cord pH 7.15.  The uterus,  tubes and ovaries showed normal decidual changes of pregnancy.  Amniotic  fluid was clear.  Three-vessel cord, central insertion, complete placenta.  The fetus was in the posterior deflexed vertex presentation with significant  molding.   OPERATIVE PROCEDURE:  The patient prepped and draped in the usual fashion  and placed in the supine position on a Berry bed.  After adequate epidural  analgesia and a Foley catheter placed prior to the procedure, a vertical  infraumbilical incision was made and the abdomen opened.  The lower uterine  segment incised transversely after developing the bladder flap.  Baby  delivered, cord clamped and cut and infant given to the pediatrics staff  after bulb suctioning.  Placenta removed manually.  Uterus inspected.  Closure of the uterine musculature in one layers of 0 Vicryl running  interlocking suture.  Bleeding points hemostatically checked.  Blood clots  removed.   Closure of the parietal peritoneum and fascia with #1 PDS suture  from either end to midline.  A Jackson-Pratt drain was then  placed subcutaneously and the 0 Prolene was used with shods to approximate  the subcutaneous layer and skin and skin staples for the skin.  Instrument  and sponge count and pad count correct.  The patient tolerated the procedure  well, returned to the post anesthesia recovery room in excellent condition.      Ginger Carne, MD  Electronically Signed     SHB/MEDQ  D:  02/19/2006  T:  02/19/2006  Job:  [id number]

## 2011-03-09 NOTE — Discharge Summary (Signed)
Eileen [name], Eileen [name]              ACCOUNT NO.:  [id number]   MEDICAL RECORD NO.:  [id number]          PATIENT TYPE:  INP   LOCATION:  9155                          [facility]   PHYSICIAN:  Angie B. Merlene Morse, MD  DATE OF BIRTH:  [birth year]   DATE OF ADMISSION:  01/03/2006  DATE OF DISCHARGE:  01/09/2006                                 DISCHARGE SUMMARY   ADMITTING ATTENDING:  Dr. [name].   DISCHARGING ATTENDING:  Dr. [name].   ADMITTING DIAGNOSES:  1.  A 32-week intrauterine pregnancy.  2.  Cellulitis.  3.  Human immunodeficiency virus.  4.  Gestational diabetes, diet controlled.  5.  Chronic hypertension.   DISCHARGE DIAGNOSES:  1.  A 32-week 6-day intrauterine pregnancy.  2.  Cellulitis.  3.  Abscess on mons pubis.  4.  Human immunodeficiency virus.  5.  Gestational diabetes A2.  6.  Low free T4.  7.  Chronic hypertension.   CONSULTS:  ID.   ADMITTING HISTORY AND PHYSICAL:  The patient is a 25 year old G1 at 26  weeks, who was seen in the clinic on the day of admission with a boil on her  mons pubis, which was surrounding cellulitis that was worsening despite  being on Keflex.   OBSTETRIC HISTORY:  She is a G1 diet controlled diabetic.   GYNECOLOGIC HISTORY:  ASCUS.   MEDICAL HISTORY:  1.  Obesity.  2.  HIV diagnosed this pregnancy.   SURGICAL HISTORY:  None.   SOCIAL HISTORY:  Smoking, cocaine use x1 early in pregnancy.   PRENATAL LABORATORY:  O positive, antibody negative, hemoglobin 11.8,  platelets 244, rubella immune, hepatitis B negative, syphilis nonreactive,  HIV positive, GC and Chlamydia negative.   PHYSICAL EXAMINATION:  VITAL SIGNS:  Temp 97.8, pulse 94 and blood pressure  141/84.  GENERAL:  A well-developed, obese female in no acute distress.  HEART:  Normal S1 and S2.  No murmurs, gallops or rubs.  CHEST:  Clear to auscultation bilaterally.  ABDOMEN:  Obese.  PELVIC:  She had a warm, erythematous, indurated mass on her area on her  mons pubis.   HOSPITAL COURSE:  The patient was admitted and she was started on IV  vancomycin for what was felt to be a cellulitis.  Infectious Disease was  consulted.  She was restarted on her home HIV medications.  HIV medicines  were restarted by ID.  Abscess spontaneously opened on March 17.  A culture  was done that grew strep viridans cornea bacteria and coag negative  Staphylococcus.  As the preliminary Gram stain showed gram-negative rods,  Zosyn was added to her antibiotic coverage.  The patient remained stable.  Wound care saw her and her wound and was packed 3 times a day with Iodoform  gauze.  The erythema and induration were decreasing at the time of discharge  and the patient's pain was much more tolerable.  The patient was  transitioned to p.o. antibiotic of Augmentin.  The patient did have what  appeared to be red man syndrome with vancomycin and was premedicated with  Benadryl prior to her remaining  doses of vancomycin, and the vancomycin was  given at a slower rate.   For her gestational diabetes, the patient had some elevated blood sugars and  was started on glyburide during this admission.  She was increased to a dose  of 5 mg twice a day.  As the patient still had elevated blood sugars, it was  felt that she would benefit from NPH.  The patient was instructed on how to  give herself NPH and is to use 10 units of NPH every evening and to continue  recording her blood sugars and follow her diet.  She will see the diabetic  educator on Monday at her high risk clinic appointment.  The patient was  found to have a low free T4 and for this reason was started on 50 mcg of  Synthroid.   Chronic hypertension.  The patient's blood pressure was elevated throughout  her stay.  Her blood pressures usually were in the 140s/90s range.   DISCHARGE INSTRUCTIONS:  The patient was discharged to home.  She was to  follow up in high risk clinic on Monday at 10:30, where she will see  the  diabetic educator.  Home health was arranged to see the patient on a daily  basis for wound check and packing.  The patient's mother was taught how to  change the packing as well, to do it an additional 2 times a day.  The  patient was to follow up with ID as previously scheduled.   MEDICATIONS:  1.  Augmentin 875, one p.o. b.i.d., #13.  2.  Percocet 5, one p.o. q.4-6h. p.r.n., #12.  3.  Synthroid 50 mcg, 1 p.o. daily.  4.  Glyburide 5 mg, 1 p.o. b.i.d.  5.  NPH 10 units q.h.s. subcutaneous.           ______________________________  Eileen [name] Merlene Morse, MD    ABC/MEDQ  D:  01/09/2006  T:  01/10/2006  Job:  [id number]   cc:   [facility]  [place], [place]

## 2011-03-09 NOTE — Discharge Summary (Signed)
NAMEADELITA, HONE NO.:  [id number]   MEDICAL RECORD NO.:  [id number]          PATIENT TYPE:  INP   LOCATION:  9303                          [facility]ILITY:  [facility]   PHYSICIAN:  Lesly Dukes, M.D. DATE OF BIRTH:  1986-03-17   DATE OF ADMISSION:  02/18/2006  DATE OF DISCHARGE:  02/21/2006                                 DISCHARGE SUMMARY   DISCHARGE DIAGNOSES:  1.  Primary lower transverse cesarean section secondary to failure to      progress.  2.  Gestational diabetes.  3.  Human immunodeficiency virus positive.  4.  Hypertension.  5.  Hypothyroidism.   DISCHARGE MEDICATIONS:  1.  Glyburide 5 mg p.o. b.i.d.  2.  Hydrochlorothiazide 25 mg p.o. daily.  3.  Synthroid 50 mcg p.o. daily.  4.  Combivir 150/300 1 tablet p.o. b.i.d.  5.  Kaletra 200/50 2 tablets p.o. q.8h.   LABORATORY DATA:  Hemoglobin 9.9, hematocrit 28.8, blood type O positive.  RPR nonreactive.  Rubella immuned.  GBS unknown.   HOSPITAL COURSE:  Ms. Gwenda is an obese 480 pound female who is HIV  positive and had a viral load less than 400 and was delivered by cesarean  section due to failure to progress pass the first stage of labor.  No  complication from the cesarean section and she delivered an 8 pound 11 ounce  female.  Mother and infant were stable.  Mom was monitored in the ICU after  delivery secondary to respiratory distress and oxygen desaturation  postoperatively.  High saturations were maintained with nonrebreather.  Eventually, the patient was transitioned to three liters of oxygen through  nasal cannula and was continued for about 48 hours and she was easily weaned  off oxygen. The patient did have high blood pressures throughout antenatal  and postnatal course.  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  The pressures were consistently in the 140s to  150s over 80s to 90s.  Her  blood sugars were well controlled initially on  Novolin and the sliding scale insulin.  We stopped the insulin on  postoperative day two and kept her on glyburide.  She will continue this at  discharge.  Her blood sugars remained under good control on this oral  medication alone.   The patient was maintained on the HIV medication the Kaletra and Combivir  that she was on during her pregnancy.  The plan was confirmed by her ID  doctor who will see her in two months for follow up.  For her  hypothyroidism, she was continued on Synthroid 50 mcg daily. The mom desired  oral contraceptives at the time of discharge and was bottle feeding her  baby.   On the day of discharge, the patient was ambulating without difficulty.  Her  pain was well controlled and she was stable on room air.   DISCHARGE INSTRUCTIONS:  1.  The patient is to take all medications as prescribed.  2.  She is to follow up at the  ID Clinic with Dr. Tinnie Gens C. Hatcher in two      months.  3.  She is to follow up at the [facility] on Monday, Feb 25, 2006      for removal of her staples and JP drain.  4.  Follow up will be arranged at the [facility] for the      patient.      Altamese Cabal, M.D.    ______________________________  Lesly Dukes, M.D.    KS/MEDQ  D:  02/21/2006  T:  02/22/2006  Job:  [id number]

## 2011-04-24 ENCOUNTER — Inpatient Hospital Stay (INDEPENDENT_AMBULATORY_CARE_PROVIDER_SITE_OTHER)
Admission: RE | Admit: 2011-04-24 | Discharge: 2011-04-24 | Disposition: A | Discharge status: Home / Self Care | Payer: Self-pay | Source: Ambulatory Visit

## 2011-04-24 ENCOUNTER — Ambulatory Visit (INDEPENDENT_AMBULATORY_CARE_PROVIDER_SITE_OTHER): Payer: Self-pay

## 2011-04-24 ENCOUNTER — Other Ambulatory Visit: Payer: Self-pay | Admitting: Licensed Clinical Social Worker

## 2011-04-24 ENCOUNTER — Other Ambulatory Visit (INDEPENDENT_AMBULATORY_CARE_PROVIDER_SITE_OTHER): Payer: Medicaid Other

## 2011-04-24 ENCOUNTER — Observation Stay (HOSPITAL_COMMUNITY)
Admission: EM | Admit: 2011-04-24 | Discharge: 2011-04-25 | Disposition: A | Discharge status: Home / Self Care | Payer: Medicaid Other | Attending: Emergency Medicine | Admitting: Emergency Medicine

## 2011-04-24 DIAGNOSIS — B20 Human immunodeficiency virus [HIV] disease: Secondary | ICD-10-CM

## 2011-04-24 DIAGNOSIS — T148XXA Other injury of unspecified body region, initial encounter: Secondary | ICD-10-CM

## 2011-04-24 DIAGNOSIS — Y998 Other external cause status: Secondary | ICD-10-CM | POA: Insufficient documentation

## 2011-04-24 DIAGNOSIS — S92919A Unspecified fracture of unspecified toe(s), initial encounter for closed fracture: Secondary | ICD-10-CM

## 2011-04-24 DIAGNOSIS — Y92009 Unspecified place in unspecified non-institutional (private) residence as the place of occurrence of the external cause: Secondary | ICD-10-CM | POA: Insufficient documentation

## 2011-04-24 DIAGNOSIS — IMO0002 Reserved for concepts with insufficient information to code with codable children: Secondary | ICD-10-CM | POA: Insufficient documentation

## 2011-04-24 DIAGNOSIS — S92919B Unspecified fracture of unspecified toe(s), initial encounter for open fracture: Principal | ICD-10-CM | POA: Insufficient documentation

## 2011-04-24 LAB — CBC
HCT: 36.1 % (ref 36.0–46.0)
Hemoglobin: 12.1 g/dL (ref 12.0–15.0)
MCV: 82.4 fL (ref 78.0–100.0)
RDW: 14 % (ref 11.5–15.5)
WBC: 4.9 10*3/uL (ref 4.0–10.5)

## 2011-04-24 LAB — DIFFERENTIAL
Basophils Absolute: 0 10*3/uL (ref 0.0–0.1)
Eosinophils Relative: 2 % (ref 0–5)
Lymphocytes Relative: 35 % (ref 12–46)
Lymphs Abs: 1.7 10*3/uL (ref 0.7–4.0)
Monocytes Absolute: 0.3 10*3/uL (ref 0.1–1.0)
Neutro Abs: 2.8 10*3/uL (ref 1.7–7.7)

## 2011-04-24 LAB — POCT I-STAT, CHEM 8
Chloride: 106 mEq/L (ref 96–112)
Creatinine, Ser: 0.9 mg/dL (ref 0.50–1.10)
Glucose, Bld: 93 mg/dL (ref 70–99)
HCT: 38 % (ref 36.0–46.0)
Potassium: 4.1 mEq/L (ref 3.5–5.1)
Sodium: 141 mEq/L (ref 135–145)

## 2011-04-25 LAB — CBC WITH DIFFERENTIAL/PLATELET
Basophils Absolute: 0 10*3/uL (ref 0.0–0.1)
Basophils Relative: 1 % (ref 0–1)
Hemoglobin: 12.6 g/dL (ref 12.0–15.0)
MCHC: 33.7 g/dL (ref 30.0–36.0)
Neutro Abs: 2.6 10*3/uL (ref 1.7–7.7)
Neutrophils Relative %: 62 % (ref 43–77)
Platelets: 168 10*3/uL (ref 150–400)
RDW: 14.4 % (ref 11.5–15.5)

## 2011-04-25 LAB — COMPREHENSIVE METABOLIC PANEL
AST: 18 U/L (ref 0–37)
Albumin: 3.9 g/dL (ref 3.5–5.2)
Alkaline Phosphatase: 47 U/L (ref 39–117)
Potassium: 3.8 mEq/L (ref 3.5–5.3)
Sodium: 138 mEq/L (ref 135–145)
Total Bilirubin: 0.4 mg/dL (ref 0.3–1.2)
Total Protein: 7.2 g/dL (ref 6.0–8.3)

## 2011-05-05 NOTE — H&P (Signed)
Eileen [name], Eileen NO.:  [id number]  MEDICAL RECORD NO.:  [id number]  LOCATION:                               FACILITY:  [facility]  PHYSICIAN:  Alvy Beal, MD    DATE OF BIRTH:  [birth year]  DATE OF ADMISSION:  04/24/2011 DATE OF DISCHARGE:                             HISTORY & PHYSICAL   DIAGNOSIS:  Open right fifth toe fracture.  HISTORY:  This is a very pleasant 25 year old woman who accidentally struck her toe on her child's toy at approximately 6:00 p.m. and noticed a lot of bleeding and pain.  She ultimately presented to the urgent care center and then was sent to the ER for further evaluation and treatment.  In the ER, she was noted to have the laceration in the fifth web space and a comminuted fracture of the fifth toe with a small intra-articular component to it.  As a result of the laceration, I was contacted to further evaluate and treat the patient.  PAST MEDICAL HISTORY: 1. Morbid obesity. 2. HIV positive without any active infection.  She is allergic to PREDNISONE as well as SULFA drugs.  She is on no current medications.  No significant surgical history.  No history of hypertension, diabetes, cardiac or pulmonary disease.  CLINICAL EXAMINATION:  The patient is a pleasant woman, appears her stated age in no acute distress.  She is alert and oriented x3.  She has no shortness of breath or chest pain.  The abdomen is soft and nontender.  She has no hip, knee, or ankle pain with joint range of motion.  The compartments are soft and nontender.  A 2+ dorsalis pedis and posterior tibialis pulses.  Capillary refill in the digits are less than 2 seconds.  She has a laceration in the fifth web space, no exposed bone, it is somewhat superficial, it does not probe to bone.  At this point in time, I provided a digital block with lidocaine and under sterile conditions irrigated the wound with 1 liter of normal saline.  At that point, I explored  the wound, I did not probe deep to bone.  At this point, I elected to close the wound with four 4-0 Prolene sutures.  I then placed a Xeroform dressing and buddy taped the fifth and fourth toe together and then wrapped the foot.  I then placed her in a Cam boot for protection.  The patient was given an IV dose of Ancef which will be repeated in 8 hours.  PLAN:  The patient after receiving the second IV dose of Ancef can be discharged home with oral Keflex 500 mg p.o. q.i.d.  Weight bearing as tolerated with a Cam boot with or without crutches if needed and will follow up with me in the office as an outpatient this Friday for reevaluation of the wound.  If she develops fevers, chills, drainage, or significant bleeding, she knows to return earlier for reevaluation.  If she develops evidence of infection, then we will address that.  I will also discuss the fracture care itself with my partner Dr. Lestine Box to determine whether or not any further fixation is required.  Otherwise, we will  treat the fracture conservatively with Cam boot.  All this was explained to the patient, she is in agreement with the plan.  She will be admitted to the CDU here in the ER for overnight observation and receive her second dose in the morning and will be discharged to home.     Alvy Beal, MD   ______________________________ Alvy Beal, MD    DDB/MEDQ  D:  04/24/2011  T:  04/25/2011  Job:  [id number]  Electronically Signed by Venita Lick MD on 05/05/2011 08:14:28 AM

## 2011-05-09 ENCOUNTER — Encounter: Payer: Self-pay | Admitting: Infectious Diseases

## 2011-05-09 ENCOUNTER — Telehealth: Payer: Self-pay | Admitting: *Deleted

## 2011-05-09 ENCOUNTER — Ambulatory Visit (INDEPENDENT_AMBULATORY_CARE_PROVIDER_SITE_OTHER): Payer: Self-pay | Admitting: Infectious Diseases

## 2011-05-09 DIAGNOSIS — B20 Human immunodeficiency virus [HIV] disease: Secondary | ICD-10-CM

## 2011-05-09 DIAGNOSIS — R87615 Unsatisfactory cytologic smear of cervix: Secondary | ICD-10-CM

## 2011-05-09 DIAGNOSIS — Z113 Encounter for screening for infections with a predominantly sexual mode of transmission: Secondary | ICD-10-CM

## 2011-05-09 DIAGNOSIS — Z79899 Other long term (current) drug therapy: Secondary | ICD-10-CM

## 2011-05-09 MED ORDER — EMTRICITAB-RILPIVIR-TENOFOV DF 200-25-300 MG PO TABS: 1.0000 | ORAL_TABLET | ORAL | Status: DC

## 2011-05-09 NOTE — Telephone Encounter (Signed)
She has an appt at [facility] on 06/14/11 at 4pm. I LM for her to call back so I can tell her this. To be there 15 minutes early, bring co-pay, insurance card & picture ID. Bring all meds she is taking. Will tell her this when she calls back

## 2011-05-09 NOTE — Assessment & Plan Note (Signed)
Encouraged to watch diet and exercise.  

## 2011-05-09 NOTE — Assessment & Plan Note (Signed)
Will refer her for repeat PAP. She also would like injectable contraception (not depo- made her bleed daily for 6 months).

## 2011-05-09 NOTE — Assessment & Plan Note (Signed)
Would like to start a qd medication. WIll give her rx for complera. Will see her back in 6 weeks to assess her tolerance/adherence.

## 2011-05-09 NOTE — Progress Notes (Signed)
  Subjective:    Patient ID: Eileen [name], female    DOB: 1985-12-05, 24 y.o.   MRN: [id number]  HPI 25 yo F with morbid obesity and HIV+. She was dx 1-07 when she was pregnant. She delivered a HIV- infant. Was treated with combivir and kaletra. She was again pregnant 2011 and had c-section 08-03-10. All tests negative.  She was seen in ED 04-24-11 with fracture of right 5th toe and laceration there as well. She underwent suturing, kept overnight. She has been wearing her boot sparingly (hurts worse with wearing).  CD4 360 VL 2750 (04-24-11).   ,Review of Systems  Respiratory: Negative for cough and shortness of breath.   Cardiovascular: Negative for chest pain.  Gastrointestinal: Negative for diarrhea and constipation.  Genitourinary: Negative for dysuria.  Neurological: Negative for headaches.       Objective:   Physical Exam  Constitutional: She appears well-developed and well-nourished.    Eyes: EOM are normal. Pupils are equal, round, and reactive to light.  Cardiovascular: Normal rate, regular rhythm and normal heart sounds.   Pulmonary/Chest: Effort normal and breath sounds normal.  Abdominal: Soft. Bowel sounds are normal. There is no tenderness.  Lymphadenopathy:    She has no cervical adenopathy.          Assessment & Plan:

## 2011-06-14 ENCOUNTER — Ambulatory Visit: Payer: Self-pay | Admitting: Physician Assistant

## 2011-06-14 NOTE — Discharge Summary (Signed)
  NAMECRISTALLE, ROHM NO.:  [id number]  MEDICAL RECORD NO.:  [id number]  LOCATION:                                 FACILITY:  PHYSICIAN:  Alvy Beal, MD    DATE OF BIRTH:  [birth year]  DATE OF ADMISSION:  04/24/2011 DATE OF DISCHARGE:  04/25/2011                              DISCHARGE SUMMARY   DIAGNOSIS:  Open right fifth toe fracture.  BRIEF HISTORY:  Eileen [name] is a very pleasant 25 year old woman who accidentally struck her toe on her child's toy and noticed a lot of bleeding and pain.  She presented to an urgent care center and was sent to the emergency room for further evaluation and treatment.  In the [facility] emergency room, she was noted to have a laceration of the fifth webspace and a comminuted fracture of the fifth toe with a small interarticular component to it.  As a result of the laceration, Dr. Shon Baton was contacted for further evaluation and treatment for the patient. ALLERGIES:  Include, PREDNISONE and SULFA.  CURRENT MEDICATIONS:  None.  PAST MEDICAL HISTORY:  Includes, morbid obesity, HIV positive without any active infection.  There is no history of hypertension, diabetes, heart or lung disease.  PROCEDURE:  I and D of the wound in the emergency room.  Irrigated with 12 mL of normal saline.  The wound was closed with 4-0 Prolene.  Following the procedure, the patient was given 1 gram of IV Ancef which was repeated in 8 hours and Percocet for pain.  DISCHARGE PLAN:  She was discharged to home with instructions to be weightbearing as tolerated with a Cam boot and to follow up in our office as an outpatient this Friday for reevaluation of the wound.  She was instructed that if she developed fever, chills, drainage, or significant bleeding, she would return earlier for reevaluation.  DISCHARGE MEDICATIONS: 1. Keflex 500 mg one p.o. q.i.d. x10 days. 2. Percocet p.r.n. for pain.  CONDITION AT THE TIME OF DISCHARGE:  Considered  stable.  All the patient's questions were encouraged, addressed, and answered. She understands the instructions at this time.  She will follow up in our office on Friday for further wound check and subsequent discussion of her expected recovery course.    ______________________________ Norval Gable, PA   ______________________________ Alvy Beal, MD    DK/MEDQ  D:  06/06/2011  T:  06/06/2011  Job:  [id number]  Electronically Signed by Norval Gable PA on 06/11/2011 12:48:42 PM Electronically Signed by Venita Lick MD on 06/14/2011 07:28:56 AM

## 2011-06-20 ENCOUNTER — Ambulatory Visit: Payer: Self-pay | Admitting: Infectious Diseases

## 2011-07-11 ENCOUNTER — Other Ambulatory Visit: Payer: Self-pay | Admitting: *Deleted

## 2011-07-11 DIAGNOSIS — B20 Human immunodeficiency virus [HIV] disease: Secondary | ICD-10-CM

## 2011-07-11 MED ORDER — EMTRICITAB-RILPIVIR-TENOFOV DF 200-25-300 MG PO TABS: 1.0000 | ORAL_TABLET | ORAL | Status: DC

## 2011-07-13 LAB — T-HELPER CELL (CD4) - (RCID CLINIC ONLY): CD4 % Helper T Cell: 30 — ABNORMAL LOW

## 2011-07-17 LAB — POCT RAPID STREP A: Streptococcus, Group A Screen (Direct): NEGATIVE

## 2011-07-18 LAB — T-HELPER CELL (CD4) - (RCID CLINIC ONLY): CD4 T Cell Abs: 630

## 2011-08-08 ENCOUNTER — Other Ambulatory Visit: Payer: Self-pay

## 2011-08-08 ENCOUNTER — Other Ambulatory Visit: Payer: Medicaid Other

## 2011-08-08 DIAGNOSIS — B20 Human immunodeficiency virus [HIV] disease: Secondary | ICD-10-CM

## 2011-08-16 ENCOUNTER — Other Ambulatory Visit: Payer: Self-pay | Admitting: Infectious Diseases

## 2011-08-16 ENCOUNTER — Other Ambulatory Visit (INDEPENDENT_AMBULATORY_CARE_PROVIDER_SITE_OTHER): Payer: Medicaid Other

## 2011-08-16 DIAGNOSIS — B20 Human immunodeficiency virus [HIV] disease: Secondary | ICD-10-CM

## 2011-08-17 LAB — CBC WITH DIFFERENTIAL/PLATELET
HCT: 38.5 % (ref 36.0–46.0)
Hemoglobin: 12.4 g/dL (ref 12.0–15.0)
Lymphs Abs: 1.4 10*3/uL (ref 0.7–4.0)
MCH: 27.1 pg (ref 26.0–34.0)
MCHC: 32.2 g/dL (ref 30.0–36.0)
Monocytes Absolute: 0.3 10*3/uL (ref 0.1–1.0)
Monocytes Relative: 5 % (ref 3–12)
Neutro Abs: 4 10*3/uL (ref 1.7–7.7)
Neutrophils Relative %: 69 % (ref 43–77)
RBC: 4.58 MIL/uL (ref 3.87–5.11)

## 2011-08-17 LAB — COMPLETE METABOLIC PANEL WITH GFR
Albumin: 3.7 g/dL (ref 3.5–5.2)
BUN: 11 mg/dL (ref 6–23)
CO2: 24 mEq/L (ref 19–32)
Calcium: 8.9 mg/dL (ref 8.4–10.5)
GFR, Est African American: >90 mL/min (ref >90)
GFR, Est Non African American: 89 mL/min — ABNORMAL LOW (ref >90)
Glucose, Bld: 97 mg/dL (ref 70–99)
Potassium: 4.3 mEq/L (ref 3.5–5.3)
Sodium: 139 mEq/L (ref 135–145)
Total Protein: 7.2 g/dL (ref 6.0–8.3)

## 2011-08-20 LAB — HIV-1 RNA QUANT-NO REFLEX-BLD

## 2011-08-22 ENCOUNTER — Ambulatory Visit: Payer: Medicaid Other | Admitting: Infectious Diseases

## 2011-09-12 ENCOUNTER — Ambulatory Visit: Payer: Medicaid Other | Admitting: Infectious Diseases

## 2011-10-25 ENCOUNTER — Ambulatory Visit (INDEPENDENT_AMBULATORY_CARE_PROVIDER_SITE_OTHER): Payer: Medicaid Other | Admitting: Infectious Diseases

## 2011-10-25 ENCOUNTER — Encounter: Payer: Self-pay | Admitting: Infectious Diseases

## 2011-10-25 DIAGNOSIS — R87615 Unsatisfactory cytologic smear of cervix: Secondary | ICD-10-CM

## 2011-10-25 DIAGNOSIS — Z79899 Other long term (current) drug therapy: Secondary | ICD-10-CM

## 2011-10-25 DIAGNOSIS — Z23 Encounter for immunization: Secondary | ICD-10-CM

## 2011-10-25 DIAGNOSIS — Z113 Encounter for screening for infections with a predominantly sexual mode of transmission: Secondary | ICD-10-CM

## 2011-10-25 DIAGNOSIS — B20 Human immunodeficiency virus [HIV] disease: Secondary | ICD-10-CM

## 2011-10-25 DIAGNOSIS — I10 Essential (primary) hypertension: Secondary | ICD-10-CM

## 2011-10-25 NOTE — Assessment & Plan Note (Addendum)
Gets flu and PNVX, she will arrange PAP herself. She appears to be taking. Her ART. Will check her labs today. See her back in 4-5 months.

## 2011-10-25 NOTE — Assessment & Plan Note (Signed)
Counseled to watch diet, exercise.

## 2011-10-25 NOTE — Assessment & Plan Note (Signed)
Hopefully can improve with diet and exercise. Most likely need rx at next visit.

## 2011-10-25 NOTE — Progress Notes (Signed)
Addended by: Wendall Mola A on: 10/25/2011 02:28 PM   Modules accepted: Orders

## 2011-10-25 NOTE — Progress Notes (Signed)
  Subjective:    Patient ID: Eileen [name], female    DOB: [birth year], 26 y.o.   MRN: [id number]  HPI 26 yo F with morbid obesity and HIV+. She was dx 1-07 when she was pregnant. She delivered a HIV- infant. Was treated with combivir and kaletra. She was again pregnant 2011 and had c-section 08-03-10. All tests negative.  She was seen in f/u July 2012 and wanted to start QDay regimen. Was put on complera.   CD4 500 VL ? (08-16-11). No problems with ART. Thinks she needs refill.  Has been having back with pain with prolonged sitting. Mostly muscle cramping.  Gained 18# since last visit.     Review of Systems  Constitutional: Negative for appetite change and unexpected weight change.  Respiratory: Negative for cough and shortness of breath.   Gastrointestinal: Negative for diarrhea and constipation.  Genitourinary: Negative for dysuria.  Musculoskeletal: Positive for back pain.       Objective:   Physical Exam  Constitutional: She appears well-developed and well-nourished.  HENT:  Mouth/Throat: No oropharyngeal exudate.  Eyes: EOM are normal. Pupils are equal, round, and reactive to light.  Neck: Neck supple.  Cardiovascular: Normal rate, regular rhythm and normal heart sounds.   Pulmonary/Chest: Effort normal and breath sounds normal. No respiratory distress. She has no wheezes.  Abdominal: Soft. Bowel sounds are normal. There is no tenderness. There is no rebound.  Lymphadenopathy:    She has no cervical adenopathy.  Psychiatric: She has a normal mood and affect.          Assessment & Plan:

## 2011-10-25 NOTE — Assessment & Plan Note (Signed)
Last pap 2011. She will reschedule.

## 2011-10-26 LAB — CBC
MCH: 27.5 pg (ref 26.0–34.0)
MCHC: 32.3 g/dL (ref 30.0–36.0)
MCV: 85.1 fL (ref 78.0–100.0)
Platelets: 154 10*3/uL (ref 150–400)
RBC: 4.29 MIL/uL (ref 3.87–5.11)

## 2011-10-26 LAB — COMPREHENSIVE METABOLIC PANEL
ALT: 36 U/L — ABNORMAL HIGH (ref 0–35)
BUN: 5 mg/dL — ABNORMAL LOW (ref 6–23)
CO2: 25 mEq/L (ref 19–32)
Calcium: 8.1 mg/dL — ABNORMAL LOW (ref 8.4–10.5)
Chloride: 105 mEq/L (ref 96–112)
Creat: 0.58 mg/dL (ref 0.50–1.10)
Glucose, Bld: 79 mg/dL (ref 70–99)

## 2011-10-26 LAB — T-HELPER CELL (CD4) - (RCID CLINIC ONLY): CD4 T Cell Abs: 380 uL — ABNORMAL LOW (ref 400–2700)

## 2011-10-26 LAB — LIPID PANEL
Cholesterol: 128 mg/dL (ref 0–200)
HDL: 27 mg/dL — ABNORMAL LOW (ref >39)
Total CHOL/HDL Ratio: 4.7 Ratio
Triglycerides: 219 mg/dL — ABNORMAL HIGH (ref <150)

## 2011-11-02 LAB — HIV-1 GENOTYPR PLUS

## 2011-12-15 ENCOUNTER — Inpatient Hospital Stay (HOSPITAL_COMMUNITY)
Admission: AD | Admit: 2011-12-15 | Discharge: 2011-12-15 | Disposition: A | Discharge status: Home / Self Care | Payer: Medicaid Other | Source: Ambulatory Visit | Attending: Obstetrics and Gynecology | Admitting: Obstetrics and Gynecology

## 2011-12-15 ENCOUNTER — Encounter (HOSPITAL_COMMUNITY): Payer: Self-pay

## 2011-12-15 ENCOUNTER — Inpatient Hospital Stay (HOSPITAL_COMMUNITY): Payer: Medicaid Other

## 2011-12-15 DIAGNOSIS — O034 Incomplete spontaneous abortion without complication: Secondary | ICD-10-CM

## 2011-12-15 DIAGNOSIS — R109 Unspecified abdominal pain: Secondary | ICD-10-CM | POA: Insufficient documentation

## 2011-12-15 DIAGNOSIS — O209 Hemorrhage in early pregnancy, unspecified: Secondary | ICD-10-CM | POA: Insufficient documentation

## 2011-12-15 DIAGNOSIS — IMO0002 Reserved for concepts with insufficient information to code with codable children: Secondary | ICD-10-CM

## 2011-12-15 HISTORY — DX: Other complications of anesthesia, initial encounter: T88.59XA

## 2011-12-15 HISTORY — DX: Asymptomatic human immunodeficiency virus (hiv) infection status: Z21

## 2011-12-15 HISTORY — DX: Complication of anesthesia during the puerperium, unspecified: O89.9

## 2011-12-15 HISTORY — DX: Human immunodeficiency virus (HIV) disease: B20

## 2011-12-15 HISTORY — DX: Adverse effect of unspecified anesthetic, initial encounter: T41.45XA

## 2011-12-15 LAB — URINALYSIS, ROUTINE W REFLEX MICROSCOPIC
Bilirubin Urine: NEGATIVE
Ketones, ur: NEGATIVE mg/dL
Nitrite: NEGATIVE
Specific Gravity, Urine: 1.02 (ref 1.005–1.030)
Urobilinogen, UA: 0.2 mg/dL (ref 0.0–1.0)

## 2011-12-15 LAB — DIFFERENTIAL
Basophils Absolute: 0 10*3/uL (ref 0.0–0.1)
Basophils Relative: 1 % (ref 0–1)
Eosinophils Absolute: 0.1 10*3/uL (ref 0.0–0.7)
Eosinophils Relative: 2 % (ref 0–5)

## 2011-12-15 LAB — CBC
MCH: 27.6 pg (ref 26.0–34.0)
MCV: 84.4 fL (ref 78.0–100.0)
Platelets: 138 10*3/uL — ABNORMAL LOW (ref 150–400)
RDW: 14.5 % (ref 11.5–15.5)
WBC: 4.4 10*3/uL (ref 4.0–10.5)

## 2011-12-15 LAB — ABO/RH: ABO/RH(D): O POS

## 2011-12-15 NOTE — Discharge Instructions (Signed)
KEEP YOUR APPOINTMENT WITH [facility]. RETURN HERE AS NEEDED.

## 2011-12-15 NOTE — ED Notes (Signed)
Patient was given comfort care packet with comfort pillow, patient is planning to follow up with [facility] on 12/25/11.

## 2011-12-15 NOTE — ED Provider Notes (Signed)
History     CSN: [id number]  Arrival date & time 12/15/11  1319   None     Chief Complaint  Patient presents with  . Abdominal Cramping   HPI Eileen [name] is a 26 y.o. female @ [redacted]w[redacted]d gestation who presents to [facility] for vaginal bleeding. The bleeding started a few days ago and then stopped. Yesterday increased and today bleeding and cramping is worse. Has not started prenatal care. The history was provided by the patient.   Past Medical History  Diagnosis Date  . HIV infection   . Hypertension   . Thyroid disease   . Genital HSV   . Syphilis     was treated  . Obesity   . HIV (human immunodeficiency virus infection)   . Anesthesia complication-postpartum     stopped breathing after spinal with c/s, in ICU  . Complication of anesthesia     Past Surgical History  Procedure Date  . Cesarean section   . Wisdom tooth extraction     Family History  Problem Relation Age of Onset  . Anesthesia problems Neg Hx   . Hypotension Neg Hx   . Malignant hyperthermia Neg Hx   . Pseudochol deficiency Neg Hx     History  Substance Use Topics  . Smoking status: Current Everyday Smoker -- 0.2 packs/day    Types: Cigarettes  . Smokeless tobacco: Never Used  . Alcohol Use: No    OB History    Grav Para Term Preterm Abortions TAB SAB Ect Mult Living   3 2 2       2       Review of Systems  Constitutional: Negative for fever, chills and activity change.  HENT: Negative.   Eyes: Negative.   Respiratory: Negative for cough and wheezing.   Cardiovascular: Negative.   Gastrointestinal: Positive for nausea and abdominal pain. Negative for diarrhea and constipation.  Genitourinary: Positive for frequency and vaginal bleeding. Negative for dysuria, urgency, vaginal discharge, vaginal pain and dyspareunia.  Neurological: Negative for dizziness and headaches.  Psychiatric/Behavioral: Positive for agitation. Negative for confusion. The patient is not nervous/anxious.     Allergies    Sulfonamide derivatives and Other  Home Medications  No current outpatient prescriptions on file.  BP 136/77  Pulse 100  Temp(Src) 97.5 F (36.4 C) (Oral)  Ht 5\' 10"  (1.778 m)  Wt 404 lb 3.2 oz (183.344 kg)  BMI 58.00 kg/m2  LMP 09/12/2011  Physical Exam  Nursing note and vitals reviewed. Constitutional: She is oriented to person, place, and time. No distress.       Extremely obese female.  HENT:  Head: Normocephalic.  Eyes: EOM are normal.  Neck: Neck supple.  Cardiovascular: Normal rate.   Pulmonary/Chest: Effort normal.  Abdominal: Soft. There is no tenderness.  Genitourinary:       External genitalia without lesions. Small amount of blood in the vaginal vault. Cervix closed, no CMT, no adnexal tenderness. Unable to palpate uterus due to patient habitus.  Musculoskeletal: Normal range of motion.  Neurological: She is alert and oriented to person, place, and time. No cranial nerve deficit.  Skin: Skin is warm and dry.  Psychiatric: She has a normal mood and affect. Her behavior is normal. Judgment and thought content normal.   Results for orders placed during the hospital encounter of 12/15/11 (from the past 24 hour(s))  URINALYSIS, ROUTINE W REFLEX MICROSCOPIC     Status: Abnormal   Collection Time   12/15/11  1:30 PM  Component Value Range   Color, Urine YELLOW  YELLOW    APPearance HAZY (*) CLEAR    Specific Gravity, Urine 1.020  1.005 - 1.030    pH 5.5  5.0 - 8.0    Glucose, UA NEGATIVE  NEGATIVE (mg/dL)   Hgb urine dipstick LARGE (*) NEGATIVE    Bilirubin Urine NEGATIVE  NEGATIVE    Ketones, ur NEGATIVE  NEGATIVE (mg/dL)   Protein, ur NEGATIVE  NEGATIVE (mg/dL)   Urobilinogen, UA 0.2  0.0 - 1.0 (mg/dL)   Nitrite NEGATIVE  NEGATIVE    Leukocytes, UA NEGATIVE  NEGATIVE   URINE MICROSCOPIC-ADD ON     Status: Abnormal   Collection Time   12/15/11  1:30 PM      Component Value Range   Squamous Epithelial / LPF FEW (*) RARE    WBC, UA 0-2  <3 (WBC/hpf)    RBC / HPF 7-10  <3 (RBC/hpf)   Bacteria, UA FEW (*) RARE    Urine-Other RARE YEAST    POCT PREGNANCY, URINE     Status: Abnormal   Collection Time   12/15/11  1:42 PM      Component Value Range   Preg Test, Ur POSITIVE (*) NEGATIVE   CBC     Status: Abnormal   Collection Time   12/15/11  2:05 PM      Component Value Range   WBC 4.4  4.0 - 10.5 (K/uL)   RBC 4.17  3.87 - 5.11 (MIL/uL)   Hemoglobin 11.5 (*) 12.0 - 15.0 (g/dL)   HCT 27.0 (*) 62.3 - 46.0 (%)   MCV 84.4  78.0 - 100.0 (fL)   MCH 27.6  26.0 - 34.0 (pg)   MCHC 32.7  30.0 - 36.0 (g/dL)   RDW 76.2  83.1 - 51.7 (%)   Platelets 138 (*) 150 - 400 (K/uL)  DIFFERENTIAL     Status: Normal   Collection Time   12/15/11  2:05 PM      Component Value Range   Neutrophils Relative 62  43 - 77 (%)   Neutro Abs 2.7  1.7 - 7.7 (K/uL)   Lymphocytes Relative 28  12 - 46 (%)   Lymphs Abs 1.2  0.7 - 4.0 (K/uL)   Monocytes Relative 8  3 - 12 (%)   Monocytes Absolute 0.4  0.1 - 1.0 (K/uL)   Eosinophils Relative 2  0 - 5 (%)   Eosinophils Absolute 0.1  0.0 - 0.7 (K/uL)   Basophils Relative 1  0 - 1 (%)   Basophils Absolute 0.0  0.0 - 0.1 (K/uL)  ABO/RH     Status: Normal   Collection Time   12/15/11  2:05 PM      Component Value Range   ABO/RH(D) O POS     US Ob Comp Less 14 Wks  12/15/2011  *RADIOLOGY REPORT*  Clinical Data: 27 year old pregnant female with bleeding and pelvic pain.  OBSTETRIC <14 WK [place] AND TRANSVAGINAL OB US  Technique:  Both transabdominal and transvaginal ultrasound examinations were performed for complete evaluation of the gestation as well as the maternal uterus, adnexal regions, and pelvic cul-de-sac.  Transvaginal technique was performed to assess early pregnancy.  Comparison:  None  Intrauterine gestational sac:  An elongated gestational sac is identified extending into the lower uterine segment. Yolk sac: Not visualized Embryo: Visualized Cardiac Activity: None identified  CRL: 18   mm  8   w  3   d  [place] EDC:  07/25/2012  Maternal uterus/adnexae: The right ovary is not visualized. The left ovary is unremarkable. There is no evidence of solid adnexal mass or free fluid.  IMPRESSION: Irregular intrauterine gestational sac with fetal pole - extending into the lower uterine segment.  Crown-rump length corresponds to a gestational age of [redacted] weeks 3 days, but no fetal cardiac activity is identified, compatible with fetal demise.  Original Report Authenticated By: Rosendo Gros, M.D.   US Ob Transvaginal  12/15/2011  *RADIOLOGY REPORT*  Clinical Data: 26 year old pregnant female with bleeding and pelvic pain.  OBSTETRIC <14 WK [place] AND TRANSVAGINAL OB US  Technique:  Both transabdominal and transvaginal ultrasound examinations were performed for complete evaluation of the gestation as well as the maternal uterus, adnexal regions, and pelvic cul-de-sac.  Transvaginal technique was performed to assess early pregnancy.  Comparison:  None  Intrauterine gestational sac:  An elongated gestational sac is identified extending into the lower uterine segment. Yolk sac: Not visualized Embryo: Visualized Cardiac Activity: None identified  CRL: 18   mm  8   w  3   d          [place] EDC: 07/25/2012  Maternal uterus/adnexae: The right ovary is not visualized. The left ovary is unremarkable. There is no evidence of solid adnexal mass or free fluid.  IMPRESSION: Irregular intrauterine gestational sac with fetal pole - extending into the lower uterine segment.  Crown-rump length corresponds to a gestational age of [redacted] weeks 3 days, but no fetal cardiac activity is identified, compatible with fetal demise.  Original Report Authenticated By: Rosendo Gros, M.D.    ED Course: discussed with Dr. Jolayne Panther and will offer patient expectant management or Cytotec.  Procedures  Patient request expectant management.  Assessment: IUFD @ 8.[redacted] weeks gestation  Plan:  Patient has appointment with [facility] 12/25/11 and will keep that appointment   Return here as  needed   Comfort pack given to patient MDM          Kerrie Buffalo, NP 12/15/11 1540

## 2011-12-15 NOTE — Progress Notes (Signed)
Thursday night had bleeding like a period dark brown in color, stopped then yesterday bleeding more (3 pads), today having cramping like contraction pain G3P2 LMP 09/13/11, less bleeding today

## 2011-12-17 NOTE — ED Provider Notes (Signed)
Agree with above note.  Eileen [name] 12/17/2011 12:00 PM

## 2012-01-15 ENCOUNTER — Other Ambulatory Visit: Payer: Self-pay | Admitting: *Deleted

## 2012-01-15 DIAGNOSIS — B20 Human immunodeficiency virus [HIV] disease: Secondary | ICD-10-CM

## 2012-02-27 ENCOUNTER — Encounter: Payer: Self-pay | Admitting: *Deleted

## 2012-02-27 ENCOUNTER — Other Ambulatory Visit: Payer: Medicaid Other

## 2012-02-27 DIAGNOSIS — B20 Human immunodeficiency virus [HIV] disease: Secondary | ICD-10-CM

## 2012-02-27 NOTE — Patient Instructions (Signed)
RN spoke w/ pt at Center today.  Pregnancy ended in fetal demise 12/15/11.  Pt has attempted to f/u post-hospitalization but appts have been delayed by OB office, [facility].  RN encouraged pt to try again for f/u appt.  Pt stated that she would try.

## 2012-02-28 LAB — CBC WITH DIFFERENTIAL/PLATELET
Basophils Absolute: 0 10*3/uL (ref 0.0–0.1)
Basophils Relative: 0 % (ref 0–1)
Hemoglobin: 10.9 g/dL — ABNORMAL LOW (ref 12.0–15.0)
Lymphocytes Relative: 28 % (ref 12–46)
MCHC: 30.6 g/dL (ref 30.0–36.0)
Neutro Abs: 2.3 10*3/uL (ref 1.7–7.7)
Neutrophils Relative %: 62 % (ref 43–77)
RDW: 15.4 % (ref 11.5–15.5)
WBC: 3.8 10*3/uL — ABNORMAL LOW (ref 4.0–10.5)

## 2012-02-28 LAB — COMPLETE METABOLIC PANEL WITH GFR
Calcium: 8.5 mg/dL (ref 8.4–10.5)
GFR, Est African American: >89 mL/min
GFR, Est Non African American: >89 mL/min
Potassium: 4 mEq/L (ref 3.5–5.3)
Sodium: 141 mEq/L (ref 135–145)

## 2012-02-28 LAB — T-HELPER CELL (CD4) - (RCID CLINIC ONLY)
CD4 % Helper T Cell: 34 % (ref 33–55)
CD4 T Cell Abs: 350 uL — ABNORMAL LOW (ref 400–2700)

## 2012-02-29 LAB — HIV-1 RNA QUANT-NO REFLEX-BLD
HIV 1 RNA Quant: <20 copies/mL (ref <20)
HIV-1 RNA Quant, Log: <1.3 {Log} (ref <1.30)

## 2012-03-11 ENCOUNTER — Telehealth: Payer: Self-pay | Admitting: *Deleted

## 2012-03-11 NOTE — Telephone Encounter (Signed)
Called patient and reminded him of appt tomorrow with Dr. Hatcher. Eileen [name]  

## 2012-03-12 ENCOUNTER — Ambulatory Visit: Payer: Medicaid Other | Admitting: Infectious Diseases

## 2012-03-12 ENCOUNTER — Telehealth: Payer: Self-pay | Admitting: *Deleted

## 2012-03-12 NOTE — Telephone Encounter (Signed)
Called patient and left voice mail to call and reschedule her appointment.  She no showed today. Wendall Mola CMA

## 2012-05-26 ENCOUNTER — Other Ambulatory Visit: Payer: Self-pay | Admitting: *Deleted

## 2012-05-26 DIAGNOSIS — B20 Human immunodeficiency virus [HIV] disease: Secondary | ICD-10-CM

## 2012-05-26 MED ORDER — EMTRICITAB-RILPIVIR-TENOFOV DF 200-25-300 MG PO TABS: 1.0000 | ORAL_TABLET | Freq: Every day | ORAL | Status: DC

## 2012-05-26 NOTE — Telephone Encounter (Signed)
ADAP Rx

## 2012-05-26 NOTE — Telephone Encounter (Signed)
ADAP 

## 2012-06-13 ENCOUNTER — Telehealth: Payer: Self-pay | Admitting: *Deleted

## 2012-06-13 NOTE — Telephone Encounter (Signed)
Called patient to try and reschedule her for a return visit and she advised she will not be back until after the baby is born she is to tired.

## 2012-06-17 ENCOUNTER — Encounter: Payer: Self-pay | Admitting: *Deleted

## 2012-07-08 ENCOUNTER — Other Ambulatory Visit: Payer: Self-pay | Admitting: Infectious Diseases

## 2012-07-08 DIAGNOSIS — B20 Human immunodeficiency virus [HIV] disease: Secondary | ICD-10-CM

## 2012-07-10 ENCOUNTER — Other Ambulatory Visit (INDEPENDENT_AMBULATORY_CARE_PROVIDER_SITE_OTHER): Payer: Medicaid Other

## 2012-07-10 DIAGNOSIS — B20 Human immunodeficiency virus [HIV] disease: Secondary | ICD-10-CM

## 2012-07-10 LAB — COMPREHENSIVE METABOLIC PANEL
Alkaline Phosphatase: 50 U/L (ref 39–117)
CO2: 29 mEq/L (ref 19–32)
Creat: 0.81 mg/dL (ref 0.50–1.10)
Glucose, Bld: 79 mg/dL (ref 70–99)
Sodium: 138 mEq/L (ref 135–145)
Total Bilirubin: 0.3 mg/dL (ref 0.3–1.2)
Total Protein: 6.6 g/dL (ref 6.0–8.3)

## 2012-07-10 LAB — CBC WITH DIFFERENTIAL/PLATELET
Basophils Relative: 1 % (ref 0–1)
Eosinophils Absolute: 0.1 10*3/uL (ref 0.0–0.7)
Eosinophils Relative: 1 % (ref 0–5)
Lymphs Abs: 1.7 10*3/uL (ref 0.7–4.0)
MCH: 26.3 pg (ref 26.0–34.0)
MCHC: 34.1 g/dL (ref 30.0–36.0)
MCV: 77.2 fL — ABNORMAL LOW (ref 78.0–100.0)
Monocytes Relative: 6 % (ref 3–12)
Neutrophils Relative %: 59 % (ref 43–77)
Platelets: 149 10*3/uL — ABNORMAL LOW (ref 150–400)

## 2012-07-11 LAB — T-HELPER CELL (CD4) - (RCID CLINIC ONLY)
CD4 % Helper T Cell: 30 % — ABNORMAL LOW (ref 33–55)
CD4 T Cell Abs: 530 uL (ref 400–2700)

## 2012-07-11 LAB — HIV-1 RNA QUANT-NO REFLEX-BLD: HIV 1 RNA Quant: 2574 copies/mL — ABNORMAL HIGH (ref <20)

## 2012-07-18 ENCOUNTER — Encounter: Payer: Self-pay | Admitting: *Deleted

## 2012-07-24 ENCOUNTER — Encounter: Payer: Self-pay | Admitting: *Deleted

## 2012-07-24 ENCOUNTER — Telehealth: Payer: Self-pay | Admitting: *Deleted

## 2012-07-24 ENCOUNTER — Ambulatory Visit: Payer: Medicaid Other | Admitting: Infectious Diseases

## 2012-07-24 NOTE — Telephone Encounter (Signed)
Called patient to try and reschedule her for missed appt and her phone is not taking phone calls at this time. Patient has missed several appts will send her a letter and give her information to [employer].

## 2012-07-31 ENCOUNTER — Encounter: Payer: Self-pay | Admitting: *Deleted

## 2012-07-31 NOTE — Progress Notes (Signed)
After sending patient profile to [facility] for case management was notified that the patient is open with [facility] and her case manager is Namon Cirri. Was given an updated number [phone] which I added to the patient profile. Will follow up with Delice Bison to see if she can get the patient to come to clinic for a visit soon.

## 2012-08-01 ENCOUNTER — Ambulatory Visit: Payer: Self-pay | Admitting: Infectious Diseases

## 2012-08-01 ENCOUNTER — Telehealth: Payer: Self-pay | Admitting: *Deleted

## 2012-08-01 NOTE — Telephone Encounter (Signed)
Called and left patient a voice mail to call the clinic to reschedule, she no showed today. Wendall Mola

## 2012-09-03 ENCOUNTER — Ambulatory Visit (INDEPENDENT_AMBULATORY_CARE_PROVIDER_SITE_OTHER): Payer: Self-pay | Admitting: Infectious Diseases

## 2012-09-03 ENCOUNTER — Encounter: Payer: Self-pay | Admitting: Infectious Diseases

## 2012-09-03 ENCOUNTER — Telehealth: Payer: Self-pay | Admitting: *Deleted

## 2012-09-03 ENCOUNTER — Other Ambulatory Visit: Payer: Self-pay | Admitting: Infectious Diseases

## 2012-09-03 VITALS — BP 146/83 | HR 69 | Temp 97.7°F | Ht 70.0 in | Wt 367.0 lb

## 2012-09-03 DIAGNOSIS — B20 Human immunodeficiency virus [HIV] disease: Secondary | ICD-10-CM

## 2012-09-03 DIAGNOSIS — Z23 Encounter for immunization: Secondary | ICD-10-CM

## 2012-09-03 DIAGNOSIS — I10 Essential (primary) hypertension: Secondary | ICD-10-CM

## 2012-09-03 DIAGNOSIS — R87615 Unsatisfactory cytologic smear of cervix: Secondary | ICD-10-CM

## 2012-09-03 NOTE — Progress Notes (Signed)
  Subjective:    Patient ID: Eileen [name], female    DOB: September 15, 1986, 26 y.o.   MRN: [id number]  HPI 26 yo F with morbid obesity and HIV+. She was dx 1-07 when she was pregnant. She delivered a HIV- infant. Was treated with combivir and kaletra. She was again pregnant 2011 and had c-section 08-03-10.   She was seen in f/u July 2012 and wanted to start QDay regimen. Was put on complera. Has lost 45# since last visit, non-intentional, has not noticed. No problems with ART. Has  Missed 2  Months of complera but replaced with klt/cbv. Not taking everyday.  26 yo at home, doing very well.   HIV 1 RNA Quant (copies/mL)  Date Value  07/10/2012 2574*  02/27/2012 <20   10/25/2011 4371*     CD4 T Cell Abs (cmm)  Date Value  07/10/2012 530   02/27/2012 350*  10/25/2011 380*      Review of Systems  Constitutional: Positive for unexpected weight change. Negative for appetite change.  Gastrointestinal: Negative for diarrhea and constipation.  Genitourinary: Negative for difficulty urinating and menstrual problem.       Objective:   Physical Exam  Constitutional: She appears well-developed and well-nourished.  Eyes: EOM are normal. Pupils are equal, round, and reactive to light.  Neck: Neck supple.  Cardiovascular: Normal rate, regular rhythm and normal heart sounds.   Pulmonary/Chest: Effort normal and breath sounds normal.  Abdominal: Soft. Bowel sounds are normal. There is no tenderness.  Lymphadenopathy:    She has no cervical adenopathy.          Assessment & Plan:

## 2012-09-03 NOTE — Assessment & Plan Note (Signed)
Encouraged her to watch her wt, encouraged her current wt loss.

## 2012-09-03 NOTE — Assessment & Plan Note (Signed)
Clinically doing well, her CD4 has come up. Unfortunately, her VL has risen as well. Will check her genotype today. See her back in 6 months if naive, if significant mutations will see back sooner. She gets flu today. Offered/refused condoms. Husband is reportedly (-), he doesn't want to use condoms.

## 2012-09-03 NOTE — Assessment & Plan Note (Signed)
Will have her seen at GYN.  

## 2012-09-03 NOTE — Assessment & Plan Note (Signed)
Cont to monitor, is mildly up.

## 2012-09-03 NOTE — Telephone Encounter (Signed)
Message left re:  PAP smear appt @ [facility] Fri., Nov. 22 @ 3:30 pm.  Asked pt to call [facility] if she needs to change appt.  Phone number left.

## 2012-09-04 LAB — HIV-1 RNA ULTRAQUANT REFLEX TO GENTYP+: HIV-1 RNA Quant, Log: 3.48 {Log} — ABNORMAL HIGH (ref <1.30)

## 2012-09-11 LAB — HIV-1 GENOTYPR PLUS

## 2012-09-12 ENCOUNTER — Ambulatory Visit: Payer: Self-pay

## 2012-10-06 ENCOUNTER — Ambulatory Visit: Payer: Self-pay

## 2012-10-06 ENCOUNTER — Telehealth: Payer: Self-pay | Admitting: *Deleted

## 2012-10-06 NOTE — Telephone Encounter (Signed)
Left message requesting pt call for new PAP smear appt.  Also, needs to schedule f/u lab and MD appts.

## 2012-11-24 ENCOUNTER — Telehealth: Payer: Self-pay | Admitting: *Deleted

## 2012-11-24 NOTE — Telephone Encounter (Signed)
Message left requesting pt call [facility] for MD, Lab and PAP smear appts.

## 2013-02-23 IMAGING — CR DG FOOT COMPLETE 3+V*R*
3 series · 3 of 3 positions shown · non-contrast
Comparison: None.

CLINICAL DATA: Status post fall.  Small toe injury.

RIGHT FOOT COMPLETE - 3+ VIEW

[view not recorded (1 of 3)]
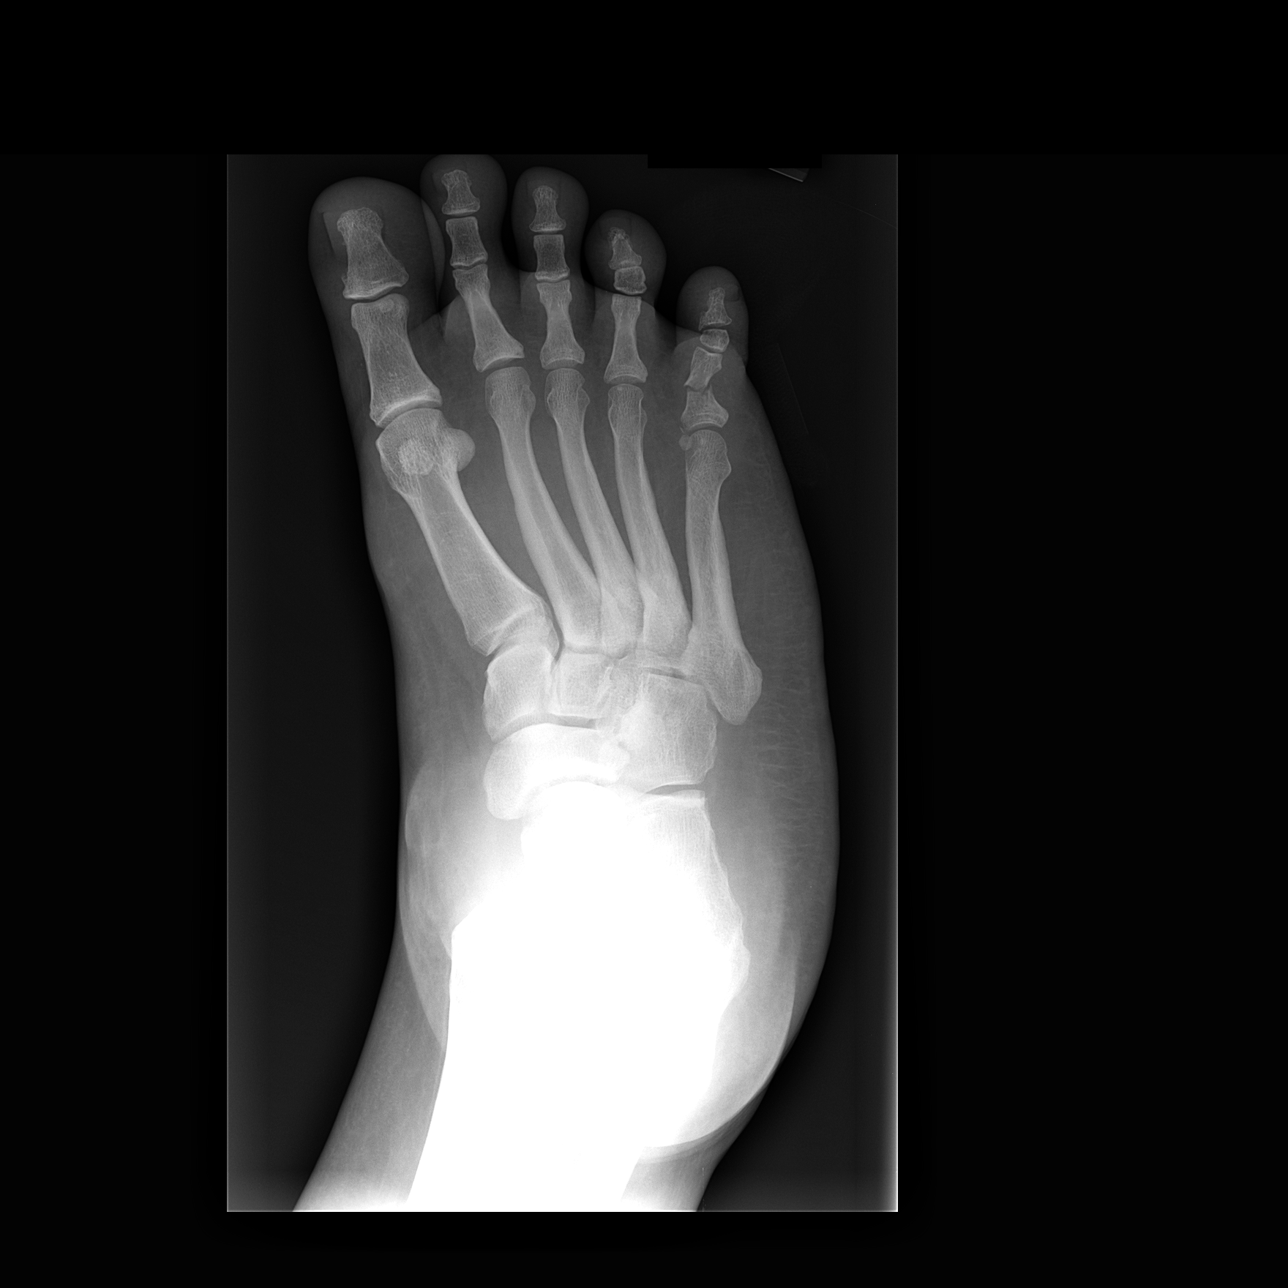

[view not recorded (2 of 3)]
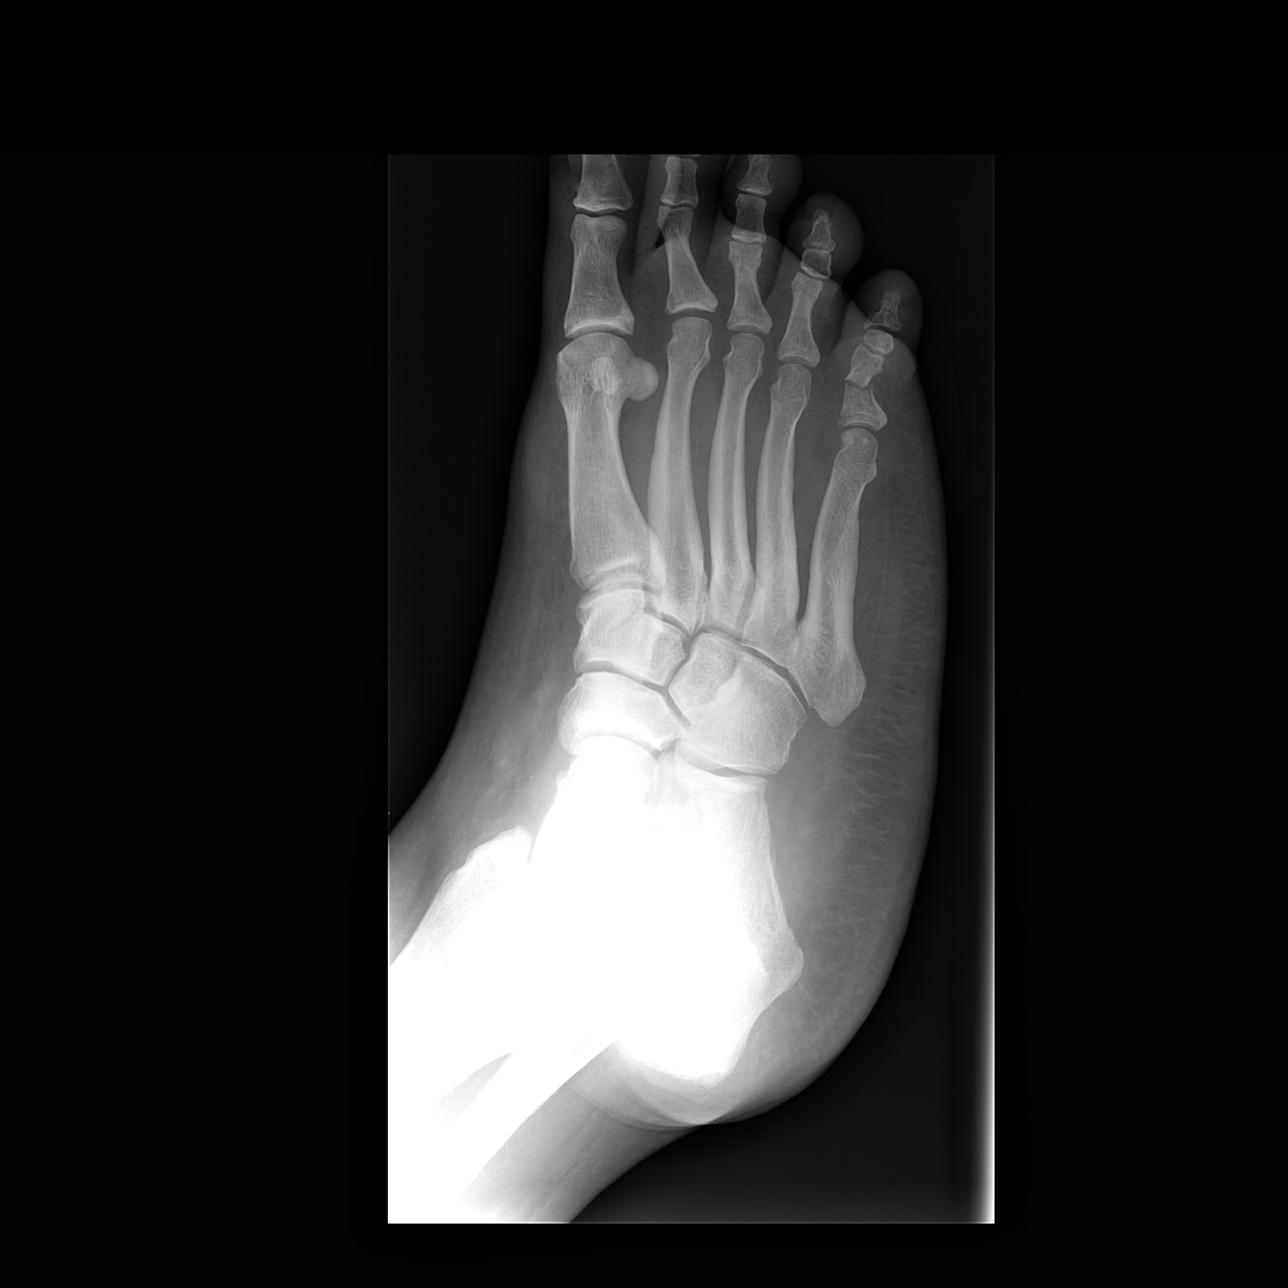

[view not recorded (3 of 3)]
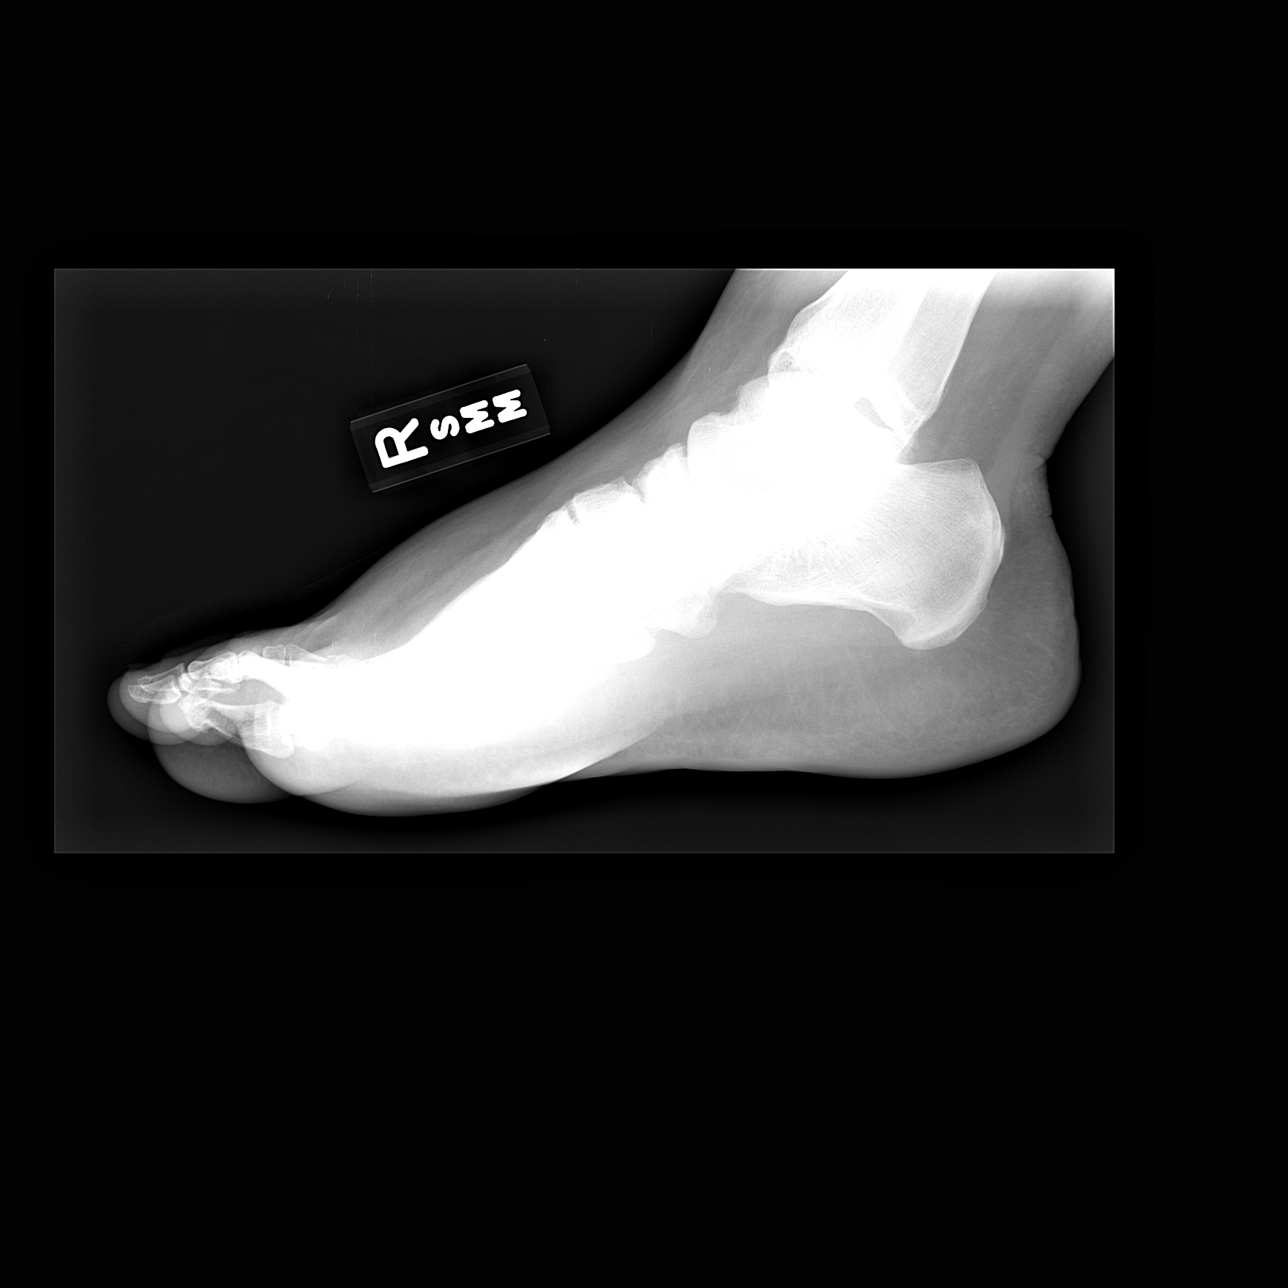

[3 of 3 positions shown; findings below may reference images not displayed]

FINDINGS: There is a transverse fracture through the mid diaphysis
of the fifth proximal phalanx.  This is mildly displaced and mildly
angulated.  In addition, there is a probable small avulsion
fracture involving the medial base of the proximal phalanx.  No
other fractures are demonstrated.  There is no dislocation. There
is forefoot soft tissue swelling.
IMPRESSION: Fractures of the proximal phalanx of the right small toe as
described.

## 2013-03-06 ENCOUNTER — Encounter (HOSPITAL_COMMUNITY): Payer: Self-pay

## 2013-03-06 ENCOUNTER — Inpatient Hospital Stay (HOSPITAL_COMMUNITY)
Admission: AD | Admit: 2013-03-06 | Discharge: 2013-03-06 | Disposition: A | Discharge status: Home / Self Care | Payer: Self-pay | Source: Ambulatory Visit | Attending: Obstetrics and Gynecology | Admitting: Obstetrics and Gynecology

## 2013-03-06 DIAGNOSIS — E669 Obesity, unspecified: Secondary | ICD-10-CM | POA: Insufficient documentation

## 2013-03-06 DIAGNOSIS — Z21 Asymptomatic human immunodeficiency virus [HIV] infection status: Secondary | ICD-10-CM | POA: Insufficient documentation

## 2013-03-06 DIAGNOSIS — O10019 Pre-existing essential hypertension complicating pregnancy, unspecified trimester: Secondary | ICD-10-CM | POA: Insufficient documentation

## 2013-03-06 DIAGNOSIS — Z789 Other specified health status: Secondary | ICD-10-CM

## 2013-03-06 DIAGNOSIS — I10 Essential (primary) hypertension: Secondary | ICD-10-CM

## 2013-03-06 DIAGNOSIS — IMO0002 Reserved for concepts with insufficient information to code with codable children: Secondary | ICD-10-CM

## 2013-03-06 DIAGNOSIS — O98519 Other viral diseases complicating pregnancy, unspecified trimester: Secondary | ICD-10-CM | POA: Insufficient documentation

## 2013-03-06 DIAGNOSIS — O99210 Obesity complicating pregnancy, unspecified trimester: Secondary | ICD-10-CM | POA: Insufficient documentation

## 2013-03-06 DIAGNOSIS — B20 Human immunodeficiency virus [HIV] disease: Secondary | ICD-10-CM

## 2013-03-06 DIAGNOSIS — O10912 Unspecified pre-existing hypertension complicating pregnancy, second trimester: Secondary | ICD-10-CM

## 2013-03-06 MED ORDER — CONCEPT OB 130-92.4-1 MG PO CAPS: 1.0000 | ORAL_CAPSULE | Freq: Every day | ORAL | Status: DC

## 2013-03-06 MED ORDER — LABETALOL HCL 100 MG PO TABS: 200.0000 mg | ORAL_TABLET | Freq: Two times a day (BID) | ORAL | Status: DC

## 2013-03-06 NOTE — MAU Note (Signed)
Had first pregnancy test the end of February. LMP 10/16/12, then had hectic schedule and can't remember if had a period in January.  States has history of HTN but doesn't take meds for it and BP is always elevated when she's nervous.

## 2013-03-06 NOTE — MAU Provider Note (Signed)
Chief Complaint: Possible Pregnancy  First Provider Initiated Contact with Patient 03/07/13 0229    SUBJECTIVE HPI: Eileen [name] is a 27 y.o. [id number] at [redacted]w[redacted]d by uncertain LMP 10/13/12 who presents to MAU requesting pregnancy verification letter and needing to start prenatal care. Positive home UPT in late February. Has history of hypertension and HIV and has been a patient at high risk clinic in the past. States she would like to go there this pregnancy as well. Patient is not currently taking medication for hypertension she states she's taken HCTZ for in the past but doesn't remember if she has been on any other medications. Denies headache, dizziness, epigastric pain, chest pain, shortness of breath, vaginal bleeding, leaking fluid. Unsure if she has experienced quickening.   Established patient at and [facility] infectious disease clinic, but hasn't been in several months do to issues with Medicaid. Had old prescription for Kaletra and Combivir which she restarted with pregnancy.  Past Medical History  Diagnosis Date  . HIV infection   . Hypertension   . Thyroid disease   . Genital HSV   . Syphilis     was treated  . Obesity   . HIV (human immunodeficiency virus infection)   . Anesthesia complication-postpartum     stopped breathing after spinal with c/s, in ICU  . Complication of anesthesia    OB History   Grav Para Term Preterm Abortions TAB SAB Ect Mult Living   4 2 2  1  1   2      # Outc Date GA Lbr Len/2nd Wgt Sex Del Anes PTL Lv   1 TRM      LTCS   Yes   2 TRM      LTCS   Yes   3 SAB            4 CUR              Past Surgical History  Procedure Laterality Date  . Cesarean section    . Wisdom tooth extraction     History   Social History  . Marital Status: Single    Spouse Name: N/A    Number of Children: N/A  . Years of Education: N/A   Occupational History  . Not on file.   Social History Main Topics  . Smoking status: Current Every Day Smoker --  0.25 packs/day    Types: Cigarettes  . Smokeless tobacco: Never Used  . Alcohol Use: No  . Drug Use: No  . Sexually Active: Yes -- Female partner(s)     Comment: pt. declined condoms. pt married 08-2011. Husband is (-), refuses to use condoms.    Other Topics Concern  . Not on file   Social History Narrative  . No narrative on file   No current facility-administered medications on file prior to encounter.   Current Outpatient Prescriptions on File Prior to Encounter  Medication Sig Dispense Refill  . Emtricitab-Rilpivir-Tenofovir (COMPLERA) 200-25-300 MG TABS Take 1 tablet by mouth daily.  30 tablet  5   Allergies  Allergen Reactions  . Sulfonamide Derivatives Anaphylaxis  . Other     Steroids, unknown reaction    ROS: Pertinent items in HPI  OBJECTIVE Blood pressure 153/89, pulse 87, temperature 98.6 F (37 C), temperature source Oral, resp. rate 18, height 5\' 10"  (1.778 m), weight 179.897 kg (396 lb 9.6 oz), last menstrual period 10/16/2012, SpO2 99.00%. Patient Vitals for the past 24 hrs:  BP Temp Temp src  Pulse Resp SpO2 Height Weight  03/06/13 0306 153/89 mmHg - - 87 - - - -  03/06/13 0253 162/98 mmHg 98.6 F (37 C) Oral 89 18 99 % 5\' 10"  (1.778 m) 179.897 kg (396 lb 9.6 oz)   GENERAL: Well-developed, morbidly obese female in no acute distress.  HEENT: Normocephalic HEART: normal rate RESP: normal effort ABDOMEN: Soft, obese, non-tender. Well-healed vertical scar below umbilicus and well-healed low transverse scar. Fundus difficult to palpate with certainty, but approximately at the umbilicus. EXTREMITIES: Nontender, no edema NEURO: Alert and oriented SPECULUM EXAM: Deferred FHTs 172 by Doppler.  LAB RESULTS Results for orders placed during the hospital encounter of 03/06/13 (from the past 24 hour(s))  POCT PREGNANCY, URINE     Status: Abnormal   Collection Time    03/06/13  2:49 AM      Result Value Range   Preg Test, Ur POSITIVE (*) NEGATIVE     IMAGING No results found.  MAU COURSE  ASSESSMENT 1. Pregnancy, status unknown   2. Hypertension in pregnancy, pre-existing, antepartum, second trimester   3. HYPERTENSION   4. Morbid obesity   5. HIV INFECTION     PLAN Discharge home in stable condition. Appointment with infectious disease clinic ASAP and take medications consistently throughout pregnancy to reduce risk of transmission of HIV to baby. Pregnancy verification letter given.     Follow-up Information   Follow up with [facility]. (will call to schedule appointment)    Contact information:   [address] [place] [address] [phone]      Follow up with [facility] [place] ULTR[facility]OUND. (will call to schedule anatomy ultrasound)    Contact information:   [address] [place] [address] [phone]      Follow up with [facility] [place] MATERNITY ADMISSIONS. (As needed in emergencies)    Contact information:   [address] [place] [address] [phone]       Medication List    TAKE these medications       CONCEPT OB 130-92.4-1 MG Caps  Take 1 tablet by mouth daily.     Emtricitab-Rilpivir-Tenofovir 200-25-300 MG Tabs  Commonly known as:  COMPLERA  Take 1 tablet by mouth daily.     labetalol 100 MG tablet  Commonly known as:  NORMODYNE  Take 2 tablets (200 mg total) by mouth 2 (two) times daily.       Victoria, CNM 03/06/2013  3:45 AM

## 2013-03-06 NOTE — MAU Note (Signed)
Patient here for pregnancy verification. Had positive pregnancy test at home. Denies any complaints at this time. Denies abdominal pain or vaginal bleeding.

## 2013-03-09 NOTE — MAU Provider Note (Signed)
Attestation of Attending Supervision of Advanced Practitioner (CNM/NP): Evaluation and management procedures were performed by the Advanced Practitioner under my supervision and collaboration.  I have reviewed the Advanced Practitioner's note and chart, and I agree with the management and plan.  Kaelah Hayashi 03/09/2013 9:19 AM   

## 2013-03-18 ENCOUNTER — Encounter (HOSPITAL_COMMUNITY): Payer: Self-pay

## 2013-03-18 ENCOUNTER — Ambulatory Visit (HOSPITAL_COMMUNITY)
Admission: RE | Admit: 2013-03-18 | Discharge: 2013-03-18 | Disposition: A | Discharge status: Home / Self Care | Payer: Self-pay | Source: Ambulatory Visit | Attending: Advanced Practice Midwife | Admitting: Advanced Practice Midwife

## 2013-03-18 ENCOUNTER — Ambulatory Visit (HOSPITAL_COMMUNITY): Admission: RE | Admit: 2013-03-18 | Payer: Self-pay | Source: Ambulatory Visit

## 2013-03-18 VITALS — BP 147/79 | HR 83 | Wt 396.0 lb

## 2013-03-18 DIAGNOSIS — O10912 Unspecified pre-existing hypertension complicating pregnancy, second trimester: Secondary | ICD-10-CM

## 2013-03-18 DIAGNOSIS — Z1389 Encounter for screening for other disorder: Secondary | ICD-10-CM | POA: Insufficient documentation

## 2013-03-18 DIAGNOSIS — O358XX0 Maternal care for other (suspected) fetal abnormality and damage, not applicable or unspecified: Secondary | ICD-10-CM | POA: Insufficient documentation

## 2013-03-18 DIAGNOSIS — O09299 Supervision of pregnancy with other poor reproductive or obstetric history, unspecified trimester: Secondary | ICD-10-CM | POA: Insufficient documentation

## 2013-03-18 DIAGNOSIS — A6000 Herpesviral infection of urogenital system, unspecified: Secondary | ICD-10-CM | POA: Insufficient documentation

## 2013-03-18 DIAGNOSIS — Z363 Encounter for antenatal screening for malformations: Secondary | ICD-10-CM | POA: Insufficient documentation

## 2013-03-18 DIAGNOSIS — O10019 Pre-existing essential hypertension complicating pregnancy, unspecified trimester: Secondary | ICD-10-CM | POA: Insufficient documentation

## 2013-03-18 DIAGNOSIS — O34219 Maternal care for unspecified type scar from previous cesarean delivery: Secondary | ICD-10-CM | POA: Insufficient documentation

## 2013-03-18 DIAGNOSIS — E669 Obesity, unspecified: Secondary | ICD-10-CM | POA: Insufficient documentation

## 2013-03-18 DIAGNOSIS — I10 Essential (primary) hypertension: Secondary | ICD-10-CM

## 2013-03-18 DIAGNOSIS — O98519 Other viral diseases complicating pregnancy, unspecified trimester: Secondary | ICD-10-CM | POA: Insufficient documentation

## 2013-03-18 DIAGNOSIS — O99330 Smoking (tobacco) complicating pregnancy, unspecified trimester: Secondary | ICD-10-CM | POA: Insufficient documentation

## 2013-03-18 DIAGNOSIS — B20 Human immunodeficiency virus [HIV] disease: Secondary | ICD-10-CM

## 2013-03-18 DIAGNOSIS — Z21 Asymptomatic human immunodeficiency virus [HIV] infection status: Secondary | ICD-10-CM | POA: Insufficient documentation

## 2013-03-18 NOTE — Progress Notes (Signed)
[facility] ultrasound  Indication: 27 yr old [id number] at [redacted]w[redacted]d by unsure LMP with chronic hypertension, morbid obesity, and HIV for fetal anatomic survey.  Findings: 1. Single intrauterine pregnancy. 2. Fetal biometry is consistent with [redacted]w[redacted]d. 3. Anterior placenta without evidence of previa. 4. Normal amniotic fluid volume. 5. Normal transabdominal cervical length. 6. The anatomic survey is extremely limited by maternal body habitus. No abnormalities are seen.  Recommendations: 1. Unsure dating: - based on today's ultrasound recommend using an estimated due date of 07/14/13 given patient is not completely sure of LMP - recommend fetal growth in 4 weeks 2. Limited anatomy survey: - discussed with patient that we may not be able to clear anatomy given body habitus; patient understands limitations of ultrasound in detecting anomalies 3. HIV: - no consult requested - not on medications currently because cannot afford them - recommend refer to social work for aid in obtaining medications - followed by ID 4. Chronic hypertension: - no consult requested - recommend fetal growth every 4 weeks - recommend close BP control - recommend antenatal testing starting at [redacted] weeks gestation - recommend delivery by estimated due date but not prior to 38 weeks in the absence of other complications - recommend close surveillance for the development of signs/symptoms of preeclampsia 5. Morbid obesity: - recommend fetal growth and testing as above 6. Follow up in 4 weeks  Eulis Foster, MD

## 2013-03-19 ENCOUNTER — Encounter (HOSPITAL_COMMUNITY): Payer: Self-pay | Admitting: Advanced Practice Midwife

## 2013-03-19 ENCOUNTER — Encounter: Payer: Self-pay | Admitting: Advanced Practice Midwife

## 2013-03-19 DIAGNOSIS — O09899 Supervision of other high risk pregnancies, unspecified trimester: Secondary | ICD-10-CM | POA: Insufficient documentation

## 2013-03-19 DIAGNOSIS — O98719 Human immunodeficiency virus [HIV] disease complicating pregnancy, unspecified trimester: Secondary | ICD-10-CM | POA: Insufficient documentation

## 2013-03-19 DIAGNOSIS — Z3490 Encounter for supervision of normal pregnancy, unspecified, unspecified trimester: Secondary | ICD-10-CM | POA: Insufficient documentation

## 2013-03-26 ENCOUNTER — Encounter: Payer: Self-pay | Admitting: Obstetrics and Gynecology

## 2013-03-26 ENCOUNTER — Ambulatory Visit (INDEPENDENT_AMBULATORY_CARE_PROVIDER_SITE_OTHER): Payer: Medicaid Other | Admitting: Obstetrics and Gynecology

## 2013-03-26 ENCOUNTER — Other Ambulatory Visit (HOSPITAL_COMMUNITY)
Admission: RE | Admit: 2013-03-26 | Discharge: 2013-03-26 | Disposition: A | Discharge status: Home / Self Care | Payer: Medicaid Other | Source: Ambulatory Visit | Attending: Obstetrics and Gynecology | Admitting: Obstetrics and Gynecology

## 2013-03-26 VITALS — BP 156/87 | Temp 97.5°F | Wt >= 6400 oz

## 2013-03-26 DIAGNOSIS — O09899 Supervision of other high risk pregnancies, unspecified trimester: Secondary | ICD-10-CM

## 2013-03-26 DIAGNOSIS — O98519 Other viral diseases complicating pregnancy, unspecified trimester: Secondary | ICD-10-CM

## 2013-03-26 DIAGNOSIS — Z113 Encounter for screening for infections with a predominantly sexual mode of transmission: Secondary | ICD-10-CM | POA: Insufficient documentation

## 2013-03-26 DIAGNOSIS — I10 Essential (primary) hypertension: Secondary | ICD-10-CM

## 2013-03-26 DIAGNOSIS — B20 Human immunodeficiency virus [HIV] disease: Secondary | ICD-10-CM

## 2013-03-26 DIAGNOSIS — Z01419 Encounter for gynecological examination (general) (routine) without abnormal findings: Secondary | ICD-10-CM | POA: Insufficient documentation

## 2013-03-26 DIAGNOSIS — Z302 Encounter for sterilization: Secondary | ICD-10-CM | POA: Insufficient documentation

## 2013-03-26 DIAGNOSIS — O34219 Maternal care for unspecified type scar from previous cesarean delivery: Secondary | ICD-10-CM | POA: Insufficient documentation

## 2013-03-26 DIAGNOSIS — O10019 Pre-existing essential hypertension complicating pregnancy, unspecified trimester: Secondary | ICD-10-CM

## 2013-03-26 DIAGNOSIS — O09892 Supervision of other high risk pregnancies, second trimester: Secondary | ICD-10-CM

## 2013-03-26 DIAGNOSIS — O10912 Unspecified pre-existing hypertension complicating pregnancy, second trimester: Secondary | ICD-10-CM

## 2013-03-26 DIAGNOSIS — O98712 Human immunodeficiency virus [HIV] disease complicating pregnancy, second trimester: Secondary | ICD-10-CM

## 2013-03-26 DIAGNOSIS — O0930 Supervision of pregnancy with insufficient antenatal care, unspecified trimester: Secondary | ICD-10-CM

## 2013-03-26 DIAGNOSIS — O0932 Supervision of pregnancy with insufficient antenatal care, second trimester: Secondary | ICD-10-CM

## 2013-03-26 DIAGNOSIS — E669 Obesity, unspecified: Secondary | ICD-10-CM

## 2013-03-26 DIAGNOSIS — O99212 Obesity complicating pregnancy, second trimester: Secondary | ICD-10-CM

## 2013-03-26 DIAGNOSIS — Z1151 Encounter for screening for human papillomavirus (HPV): Secondary | ICD-10-CM | POA: Insufficient documentation

## 2013-03-26 LAB — POCT URINALYSIS DIP (DEVICE)
Hgb urine dipstick: NEGATIVE
Nitrite: NEGATIVE
Protein, ur: NEGATIVE mg/dL
Specific Gravity, Urine: 1.025 (ref 1.005–1.030)
Urobilinogen, UA: 0.2 mg/dL (ref 0.0–1.0)
pH: 6 (ref 5.0–8.0)

## 2013-03-26 LAB — GLUCOSE TOLERANCE, 1 HOUR (50G) W/O FASTING: Glucose, 1 Hour GTT: 99 mg/dL (ref 70–140)

## 2013-03-26 MED ORDER — LABETALOL HCL 200 MG PO TABS: 400.0000 mg | ORAL_TABLET | Freq: Two times a day (BID) | ORAL | Status: DC

## 2013-03-26 NOTE — Progress Notes (Signed)
Pulse- 79   Pt c/o right arm going to sleep "goes numb and hurts so bad" New ob packet given

## 2013-03-26 NOTE — Progress Notes (Signed)
F/U MFM ultrasound on 6/25. Labetalol increased to 400 mg BID  Subjective:    Eileen [name] is a [id number] [redacted]w[redacted]d being seen today for her first obstetrical visit.  Her obstetrical history is significant for obesity and CHTN, HIV, and late onset of prenatal care. Patient does intend to breast feed. Pregnancy history fully reviewed.  Patient reports no complaints.  Filed Vitals:   03/26/13 1052  BP: 156/87  Temp: 97.5 F (36.4 C)  Weight: 403 lb 12.8 oz (183.162 kg)    HISTORY: OB History   Grav Para Term Preterm Abortions TAB SAB Ect Mult Living   4 2 2  1  1   2      # Outc Date GA Lbr Len/2nd Wgt Sex Del Anes PTL Lv   1 TRM 4/07 [redacted]w[redacted]d  7lb11oz(3.487kg) M CS Spinal  Yes   Comments: GDM, HTN   2 TRM 10/11 [redacted]w[redacted]d   F LTCS Spinal  Yes   3 SAB 2/13           4 CUR              Past Medical History  Diagnosis Date  . HIV infection   . Hypertension   . Thyroid disease   . Genital herpes   . Syphilis     was treated  . Obesity   . HIV (human immunodeficiency virus infection)   . Anesthesia complication-postpartum     stopped breathing after spinal with c/s, in ICU  . Complication of anesthesia   . Gestational diabetes    Past Surgical History  Procedure Laterality Date  . Cesarean section    . Wisdom tooth extraction     Family History  Problem Relation Age of Onset  . Anesthesia problems Neg Hx   . Hypotension Neg Hx   . Malignant hyperthermia Neg Hx   . Pseudochol deficiency Neg Hx   . Diabetes Maternal Grandmother      Exam    Uterus:     Pelvic Exam:    Perineum: Normal Perineum   Vulva: normal   Vagina:  normal mucosa, normal discharge   pH:    Cervix: closed and long   Adnexa: not evaluated   Bony Pelvis: android  System: Breast:  normal appearance, no masses or tenderness   Skin: normal coloration and turgor, no rashes    Neurologic: oriented, no focal deficits   Extremities: normal strength, tone, and muscle mass   HEENT extra ocular  movement intact   Mouth/Teeth mucous membranes moist, pharynx normal without lesions   Neck supple and no masses   Cardiovascular: regular rate and rhythm   Respiratory:  chest clear, no wheezing, crepitations, rhonchi, normal symmetric air entry   Abdomen: obese   Urinary:       Assessment:    Pregnancy: [id number] Patient Active Problem List   Diagnosis Date Noted  . Previous cesarean section complicating pregnancy 03/26/2013    Priority: Medium  . Obesity complicating pregnancy in second trimester 03/26/2013    Priority: Medium  . Preexisting hypertension complicating pregnancy in second trimester, antepartum 03/26/2013    Priority: Medium  . Insufficient prenatal care in second trimester 03/26/2013    Priority: Medium  . Supervision of other high-risk pregnancy 03/19/2013    Priority: Medium  . Maternal HIV infection during pregnancy 03/19/2013    Priority: Medium  . HYPERTENSION 12/06/2006    Priority: Medium  . Morbid obesity 11/12/2006    Priority: Medium  . HIV  INFECTION 11/06/2005    Priority: Medium  . Pregnancy with uncertain dates 03/19/2013  . HYPOTHYROIDISM 12/06/2006  . SINUSITIS, CHRONIC 11/12/2006  . ABFND PAP SMEAR, UNSATISFACTORY 11/12/2006        Plan:     Initial labs drawn. Prenatal vitamins. Problem list reviewed and updated. Genetic Screening discussed : too late.  Ultrasound discussed; fetal survey: results reviewed. Will increase labetalol to 400 BID Will obtain baseline labs F/U with ID  Follow up in 2 weeks. 50% of 30 min visit spent on counseling and coordination of care.     Tarrin Menn 03/26/2013

## 2013-03-26 NOTE — Progress Notes (Signed)
Nutrition Note:  1st visit HIV +; hypertension Eats 4-5 small meals/day.  Drinks 2 percent milk, water, diet soda.  Wt. Gain less than expected.   Discussed importance of limiting sodium intake especially through processed foods; Discussed 11-20# wt gain. Certified for [facility] today. F/U as needed. Willette Alma, MPH, RD, LDN

## 2013-03-26 NOTE — Patient Instructions (Signed)
Pregnancy - Second Trimester The second trimester of pregnancy (3 to 6 months) is a period of rapid growth for you and your baby. At the end of the sixth month, your baby is about 9 inches long and weighs 1 1/2 pounds. You will begin to feel the baby move between 18 and 20 weeks of the pregnancy. This is called quickening. Weight gain is faster. A clear fluid (colostrum) may leak out of your breasts. You may feel small contractions of the womb (uterus). This is known as false labor or Braxton-Hicks contractions. This is like a practice for labor when the baby is ready to be born. Usually, the problems with morning sickness have usually passed by the end of your first trimester. Some women develop small dark blotches (called cholasma, mask of pregnancy) on their face that usually goes away after the baby is born. Exposure to the sun makes the blotches worse. Acne may also develop in some pregnant women and pregnant women who have acne, may find that it goes away. PRENATAL EXAMS  Blood work may continue to be done during prenatal exams. These tests are done to check on your health and the probable health of your baby. Blood work is used to follow your blood levels (hemoglobin). Anemia (low hemoglobin) is common during pregnancy. Iron and vitamins are given to help prevent this. You will also be checked for diabetes between 24 and 28 weeks of the pregnancy. Some of the previous blood tests may be repeated.  The size of the uterus is measured during each visit. This is to make sure that the baby is continuing to grow properly according to the dates of the pregnancy.  Your blood pressure is checked every prenatal visit. This is to make sure you are not getting toxemia.  Your urine is checked to make sure you do not have an infection, diabetes or protein in the urine.  Your weight is checked often to make sure gains are happening at the suggested rate. This is to ensure that both you and your baby are  growing normally.  Sometimes, an ultrasound is performed to confirm the proper growth and development of the baby. This is a test which bounces harmless sound waves off the baby so your caregiver can more accurately determine due dates. Sometimes, a test is done on the amniotic fluid surrounding the baby. This test is called an amniocentesis. The amniotic fluid is obtained by sticking a needle into the belly (abdomen). This is done to check the chromosomes in instances where there is a concern about possible genetic problems with the baby. It is also sometimes done near the end of pregnancy if an early delivery is required. In this case, it is done to help make sure the baby's lungs are mature enough for the baby to live outside of the womb. CHANGES OCCURING IN THE SECOND TRIMESTER OF PREGNANCY Your body goes through many changes during pregnancy. They vary from person to person. Talk to your caregiver about changes you notice that you are concerned about.  During the second trimester, you will likely have an increase in your appetite. It is normal to have cravings for certain foods. This varies from person to person and pregnancy to pregnancy.  Your lower abdomen will begin to bulge.  You may have to urinate more often because the uterus and baby are pressing on your bladder. It is also common to get more bladder infections during pregnancy. You can help this by drinking lots of fluids   and emptying your bladder before and after intercourse.  You may begin to get stretch marks on your hips, abdomen, and breasts. These are normal changes in the body during pregnancy. There are no exercises or medicines to take that prevent this change.  You may begin to develop swollen and bulging veins (varicose veins) in your legs. Wearing support hose, elevating your feet for 15 minutes, 3 to 4 times a day and limiting salt in your diet helps lessen the problem.  Heartburn may develop as the uterus grows and  pushes up against the stomach. Antacids recommended by your caregiver helps with this problem. Also, eating smaller meals 4 to 5 times a day helps.  Constipation can be treated with a stool softener or adding bulk to your diet. Drinking lots of fluids, and eating vegetables, fruits, and whole grains are helpful.  Exercising is also helpful. If you have been very active up until your pregnancy, most of these activities can be continued during your pregnancy. If you have been less active, it is helpful to start an exercise program such as walking.  Hemorrhoids may develop at the end of the second trimester. Warm sitz baths and hemorrhoid cream recommended by your caregiver helps hemorrhoid problems.  Backaches may develop during this time of your pregnancy. Avoid heavy lifting, wear low heal shoes, and practice good posture to help with backache problems.  Some pregnant women develop tingling and numbness of their hand and fingers because of swelling and tightening of ligaments in the wrist (carpel tunnel syndrome). This goes away after the baby is born.  As your breasts enlarge, you may have to get a bigger bra. Get a comfortable, cotton, support bra. Do not get a nursing bra until the last month of the pregnancy if you will be nursing the baby.  You may get a dark line from your belly button to the pubic area called the linea nigra.  You may develop rosy cheeks because of increase blood flow to the face.  You may develop spider looking lines of the face, neck, arms, and chest. These go away after the baby is born. HOME CARE INSTRUCTIONS   It is extremely important to avoid all smoking, herbs, alcohol, and unprescribed drugs during your pregnancy. These chemicals affect the formation and growth of the baby. Avoid these chemicals throughout the pregnancy to ensure the delivery of a healthy infant.  Most of your home care instructions are the same as suggested for the first trimester of your  pregnancy. Keep your caregiver's appointments. Follow your caregiver's instructions regarding medicine use, exercise, and diet.  During pregnancy, you are providing food for you and your baby. Continue to eat regular, well-balanced meals. Choose foods such as meat, fish, milk and other low fat dairy products, vegetables, fruits, and whole-grain breads and cereals. Your caregiver will tell you of the ideal weight gain.  A physical sexual relationship may be continued up until near the end of pregnancy if there are no other problems. Problems could include early (premature) leaking of amniotic fluid from the membranes, vaginal bleeding, abdominal pain, or other medical or pregnancy problems.  Exercise regularly if there are no restrictions. Check with your caregiver if you are unsure of the safety of some of your exercises. The greatest weight gain will occur in the last 2 trimesters of pregnancy. Exercise will help you:  Control your weight.  Get you in shape for labor and delivery.  Lose weight after you have the baby.  Wear   a good support or jogging bra for breast tenderness during pregnancy. This may help if worn during sleep. Pads or tissues may be used in the bra if you are leaking colostrum.  Do not use hot tubs, steam rooms or saunas throughout the pregnancy.  Wear your seat belt at all times when driving. This protects you and your baby if you are in an accident.  Avoid raw meat, uncooked cheese, cat litter boxes, and soil used by cats. These carry germs that can cause birth defects in the baby.  The second trimester is also a good time to visit your dentist for your dental health if this has not been done yet. Getting your teeth cleaned is okay. Use a soft toothbrush. Brush gently during pregnancy.  It is easier to leak urine during pregnancy. Tightening up and strengthening the pelvic muscles will help with this problem. Practice stopping your urination while you are going to the  bathroom. These are the same muscles you need to strengthen. It is also the muscles you would use as if you were trying to stop from passing gas. You can practice tightening these muscles up 10 times a set and repeating this about 3 times per day. Once you know what muscles to tighten up, do not perform these exercises during urination. It is more likely to contribute to an infection by backing up the urine.  Ask for help if you have financial, counseling, or nutritional needs during pregnancy. Your caregiver will be able to offer counseling for these needs as well as refer you for other special needs.  Your skin may become oily. If so, wash your face with mild soap, use non-greasy moisturizer and oil or cream based makeup. MEDICINES AND DRUG USE IN PREGNANCY  Take prenatal vitamins as directed. The vitamin should contain 1 milligram of folic acid. Keep all vitamins out of reach of children. Only a couple vitamins or tablets containing iron may be fatal to a baby or young child when ingested.  Avoid use of all medicines, including herbs, over-the-counter medicines, not prescribed or suggested by your caregiver. Only take over-the-counter or prescription medicines for pain, discomfort, or fever as directed by your caregiver. Do not use aspirin.  Let your caregiver also know about herbs you may be using.  Alcohol is related to a number of birth defects. This includes fetal alcohol syndrome. All alcohol, in any form, should be avoided completely. Smoking will cause low birth rate and premature babies.  Street or illegal drugs are very harmful to the baby. They are absolutely forbidden. A baby born to an addicted mother will be addicted at birth. The baby will go through the same withdrawal an adult does. SEEK MEDICAL CARE IF:  You have any concerns or worries during your pregnancy. It is better to call with your questions if you feel they cannot wait, rather than worry about them. SEEK IMMEDIATE  MEDICAL CARE IF:   An unexplained oral temperature above 102 F (38.9 C) develops, or as your caregiver suggests.  You have leaking of fluid from the vagina (birth canal). If leaking membranes are suspected, take your temperature and tell your caregiver of this when you call.  There is vaginal spotting, bleeding, or passing clots. Tell your caregiver of the amount and how many pads are used. Light spotting in pregnancy is common, especially following intercourse.  You develop a bad smelling vaginal discharge with a change in the color from clear to white.  You continue to feel   sick to your stomach (nauseated) and have no relief from remedies suggested. You vomit blood or coffee ground-like materials.  You lose more than 2 pounds of weight or gain more than 2 pounds of weight over 1 week, or as suggested by your caregiver.  You notice swelling of your face, hands, feet, or legs.  You get exposed to [place] measles and have never had them.  You are exposed to fifth disease or chickenpox.  You develop belly (abdominal) pain. Round ligament discomfort is a common non-cancerous (benign) cause of abdominal pain in pregnancy. Your caregiver still must evaluate you.  You develop a bad headache that does not go away.  You develop fever, diarrhea, pain with urination, or shortness of breath.  You develop visual problems, blurry, or double vision.  You fall or are in a car accident or any kind of trauma.  There is mental or physical violence at home. Document Released: 10/02/2001 Document Revised: 07/02/2012 Document Reviewed: 04/06/2009 [facility] Patient Information 2014 [place], [place].  Contraception Choices Contraception (birth control) is the use of any methods or devices to prevent pregnancy. Below are some methods to help avoid pregnancy. HORMONAL METHODS   Contraceptive implant. This is a thin, plastic tube containing progesterone hormone. It does not contain estrogen hormone. Your  caregiver inserts the tube in the inner part of the upper arm. The tube can remain in place for up to 3 years. After 3 years, the implant must be removed. The implant prevents the ovaries from releasing an egg (ovulation), thickens the cervical mucus which prevents sperm from entering the uterus, and thins the lining of the inside of the uterus.  Progesterone-only injections. These injections are given every 3 months by your caregiver to prevent pregnancy. This synthetic progesterone hormone stops the ovaries from releasing eggs. It also thickens cervical mucus and changes the uterine lining. This makes it harder for sperm to survive in the uterus.  Birth control pills. These pills contain estrogen and progesterone hormone. They work by stopping the egg from forming in the ovary (ovulation). Birth control pills are prescribed by a caregiver.Birth control pills can also be used to treat heavy periods.  Minipill. This type of birth control pill contains only the progesterone hormone. They are taken every day of each month and must be prescribed by your caregiver.  Birth control patch. The patch contains hormones similar to those in birth control pills. It must be changed once a week and is prescribed by a caregiver.  Vaginal ring. The ring contains hormones similar to those in birth control pills. It is left in the vagina for 3 weeks, removed for 1 week, and then a new one is put back in place. The patient must be comfortable inserting and removing the ring from the vagina.A caregiver's prescription is necessary.  Emergency contraception. Emergency contraceptives prevent pregnancy after unprotected sexual intercourse. This pill can be taken right after sex or up to 5 days after unprotected sex. It is most effective the sooner you take the pills after having sexual intercourse. Emergency contraceptive pills are available without a prescription. Check with your pharmacist. Do not use emergency  contraception as your only form of birth control. BARRIER METHODS   Female condom. This is a thin sheath (latex or rubber) that is worn over the penis during sexual intercourse. It can be used with spermicide to increase effectiveness.  Female condom. This is a soft, loose-fitting sheath that is put into the vagina before sexual intercourse.  Diaphragm. This  is a soft, latex, dome-shaped barrier that must be fitted by a caregiver. It is inserted into the vagina, along with a spermicidal jelly. It is inserted before intercourse. The diaphragm should be left in the vagina for 6 to 8 hours after intercourse.  Cervical cap. This is a round, soft, latex or plastic cup that fits over the cervix and must be fitted by a caregiver. The cap can be left in place for up to 48 hours after intercourse.  Sponge. This is a soft, circular piece of polyurethane foam. The sponge has spermicide in it. It is inserted into the vagina after wetting it and before sexual intercourse.  Spermicides. These are chemicals that kill or block sperm from entering the cervix and uterus. They come in the form of creams, jellies, suppositories, foam, or tablets. They do not require a prescription. They are inserted into the vagina with an applicator before having sexual intercourse. The process must be repeated every time you have sexual intercourse. INTRAUTERINE CONTRACEPTION  Intrauterine device (IUD). This is a T-shaped device that is put in a woman's uterus during a menstrual period to prevent pregnancy. There are 2 types:  Copper IUD. This type of IUD is wrapped in copper wire and is placed inside the uterus. Copper makes the uterus and fallopian tubes produce a fluid that kills sperm. It can stay in place for 10 years.  Hormone IUD. This type of IUD contains the hormone progestin (synthetic progesterone). The hormone thickens the cervical mucus and prevents sperm from entering the uterus, and it also thins the uterine lining to  prevent implantation of a fertilized egg. The hormone can weaken or kill the sperm that get into the uterus. It can stay in place for 5 years. PERMANENT METHODS OF CONTRACEPTION  Female tubal ligation. This is when the woman's fallopian tubes are surgically sealed, tied, or blocked to prevent the egg from traveling to the uterus.  Female sterilization. This is when the female has the tubes that carry sperm tied off (vasectomy).This blocks sperm from entering the vagina during sexual intercourse. After the procedure, the man can still ejaculate fluid (semen). NATURAL PLANNING METHODS  Natural family planning. This is not having sexual intercourse or using a barrier method (condom, diaphragm, cervical cap) on days the woman could become pregnant.  Calendar method. This is keeping track of the length of each menstrual cycle and identifying when you are fertile.  Ovulation method. This is avoiding sexual intercourse during ovulation.  Symptothermal method. This is avoiding sexual intercourse during ovulation, using a thermometer and ovulation symptoms.  Post-ovulation method. This is timing sexual intercourse after you have ovulated. Regardless of which type or method of contraception you choose, it is important that you use condoms to protect against the transmission of sexually transmitted diseases (STDs). Talk with your caregiver about which form of contraception is most appropriate for you. Document Released: 10/08/2005 Document Revised: 12/31/2011 Document Reviewed: 02/14/2011 [facility] Patient Information 2014 [place], [place].  Breastfeeding A change in hormones during your pregnancy causes growth of your breast tissue and an increase in number and size of milk ducts. The hormone prolactin allows proteins, sugars, and fats from your blood supply to make breast milk in your milk-producing glands. The hormone progesterone prevents breast milk from being released before the birth of your baby. After  the birth of your baby, your progesterone level decreases allowing breast milk to be released. Thoughts of your baby, as well as his or her sucking or crying, can  stimulate the release of milk from the milk-producing glands. Deciding to breastfeed (nurse) is one of the best choices you can make for you and your baby. The information that follows gives a brief review of the benefits, as well as other important skills to know about breastfeeding. BENEFITS OF BREASTFEEDING For your baby  The first milk (colostrum) helps your baby's digestive system function better.   There are antibodies in your milk that help your baby fight off infections.   Your baby has a lower incidence of asthma, allergies, and sudden infant death syndrome (SIDS).   The nutrients in breast milk are better for your baby than infant formulas.  Breast milk improves your baby's brain development.   Your baby will have less gas, colic, and constipation.  Your baby is less likely to develop other conditions, such as childhood obesity, asthma, or diabetes mellitus. For you  Breastfeeding helps develop a very special bond between you and your baby.   Breastfeeding is convenient, always available at the correct temperature, and costs nothing.   Breastfeeding helps to burn calories and helps you lose the weight gained during pregnancy.   Breastfeeding makes your uterus contract back down to normal size faster and slows bleeding following delivery.   Breastfeeding mothers have a lower risk of developing osteoporosis or breast or ovarian cancer later in life.  BREASTFEEDING FREQUENCY  A healthy, full-term baby may breastfeed as often as every hour or space his or her feedings to every 3 hours. Breastfeeding frequency will vary from baby to baby.   Newborns should be fed no less than every 2 3 hours during the day and every 4 5 hours during the night. You should breastfeed a minimum of 8 feedings in a 24 hour  period.  Awaken your baby to breastfeed if it has been 3 4 hours since the last feeding.  Breastfeed when you feel the need to reduce the fullness of your breasts or when your newborn shows signs of hunger. Signs that your baby may be hungry include:  Increased alertness or activity.  Stretching.  Movement of the head from side to side.  Movement of the head and opening of the mouth when the corner of the mouth or cheek is stroked (rooting).  Increased sucking sounds, smacking lips, cooing, sighing, or squeaking.  Hand-to-mouth movements.  Increased sucking of fingers or hands.  Fussing.  Intermittent crying.  Signs of extreme hunger will require calming and consoling before you try to feed your baby. Signs of extreme hunger may include:  Restlessness.  A loud, strong cry.  Screaming.  Frequent feeding will help you make more milk and will help prevent problems, such as sore nipples and engorgement of the breasts.  BREASTFEEDING   Whether lying down or sitting, be sure that the baby's abdomen is facing your abdomen.   Support your breast with 4 fingers under your breast and your thumb above your nipple. Make sure your fingers are well away from your nipple and your baby's mouth.   Stroke your baby's lips gently with your finger or nipple.   When your baby's mouth is open wide enough, place all of your nipple and as much of the colored area around your nipple (areola) as possible into your baby's mouth.  More areola should be visible above his or her upper lip than below his or her lower lip.  Your baby's tongue should be between his or her lower gum and your breast.  Ensure that your baby's  mouth is correctly positioned around the nipple (latched). Your baby's lips should create a seal on your breast.  Signs that your baby has effectively latched onto your nipple include:  Tugging or sucking without pain.  Swallowing heard between sucks.  Absent click or  smacking sound.  Muscle movement above and in front of his or her ears with sucking.  Your baby must suck about 2 3 minutes in order to get your milk. Allow your baby to feed on each breast as long as he or she wants. Nurse your baby until he or she unlatches or falls asleep at the first breast, then offer the second breast.  Signs that your baby is full and satisfied include:  A gradual decrease in the number of sucks or complete cessation of sucking.  Falling asleep.  Extension or relaxation of his or her body.  Retention of a small amount of milk in his or her mouth.  Letting go of your breast by himself or herself.  Signs of effective breastfeeding in you include:  Breasts that have increased firmness, weight, and size prior to feeding.  Breasts that are softer after nursing.  Increased milk volume, as well as a change in milk consistency and color by the 5th day of breastfeeding.  Breast fullness relieved by breastfeeding.  Nipples are not sore, cracked, or bleeding.  If needed, break the suction by putting your finger into the corner of your baby's mouth and sliding your finger between his or her gums. Then, remove your breast from his or her mouth.  It is common for babies to spit up a small amount after a feeding.  Babies often swallow air during feeding. This can make babies fussy. Burping your baby between breasts can help with this.  Vitamin D supplements are recommended for babies who get only breast milk.  Avoid using a pacifier during your baby's first 4 6 weeks.  Avoid supplemental feedings of water, formula, or juice in place of breastfeeding. Breast milk is all the food your baby needs. It is not necessary for your baby to have water or formula. Your breasts will make more milk if supplemental feedings are avoided during the early weeks. HOW TO TELL WHETHER YOUR BABY IS GETTING ENOUGH BREAST MILK Wondering whether or not your baby is getting enough milk is a  common concern among mothers. You can be assured that your baby is getting enough milk if:   Your baby is actively sucking and you hear swallowing.   Your baby seems relaxed and satisfied after a feeding.   Your baby nurses at least 8 12 times in a 24 hour time period.  During the first 65 74 days of age:  Your baby is wetting at least 3 5 diapers in a 24 hour period. The urine should be clear and pale yellow.  Your baby is having at least 3 4 stools in a 24 hour period. The stool should be soft and yellow.  At 28 63 days of age, your baby is having at least 3 6 stools in a 24 hour period. The stool should be seedy and yellow by 52 days of age.  Your baby has a weight loss less than 7 10% during the first 74 days of age.  Your baby does not lose weight after 82 67 days of age.  Your baby gains 4 7 ounces each week after he or she is 17 days of age.  Your baby gains weight by 5 days of  age and is back to birth weight within 2 weeks. ENGORGEMENT In the first week after your baby is born, you may experience extremely full breasts (engorgement). When engorged, your breasts may feel heavy, warm, or tender to the touch. Engorgement peaks within 24 48 hours after delivery of your baby.  Engorgement may be reduced by:  Continuing to breastfeed.  Increasing the frequency of breastfeeding.  Taking warm showers or applying warm, moist heat to your breasts just before each feeding. This increases circulation and helps the milk flow.   Gently massaging your breast before and during the feedings. With your fingertips, massage from your chest wall towards your nipple in a circular motion.   Ensuring that your baby empties at least one breast at every feeding. It also helps to start the next feeding on the opposite breast.   Expressing breast milk by hand or by using a breast pump to empty the breasts if your baby is sleepy, or not nursing well. You may also want to express milk if you are  returning to work oryou feel you are getting engorged.  Ensuring your baby is latched on and positioned properly while breastfeeding. If you follow these suggestions, your engorgement should improve in 24 48 hours. If you are still experiencing difficulty, call your lactation consultant or caregiver.  CARING FOR YOURSELF Take care of your breasts.  Bathe or shower daily.   Avoid using soap on your nipples.   Wear a supportive bra. Avoid wearing underwire style bras.  Air dry your nipples for a 3 after each feeding.   Use only cotton bra pads to absorb breast milk leakage. Leaking of breast milk between feedings is normal.   Use only pure lanolin on your nipples after nursing. You do not need to wash it off before feeding your baby again. Another option is to express a few drops of breast milk and gently massage that milk into your nipples.  Continue breast self-awareness checks. Take care of yourself.  Eat healthy foods. Alternate 3 meals with 3 snacks.  Avoid foods that you notice affect your baby in a bad way.  Drink milk, fruit juice, and water to satisfy your thirst (about 8 glasses a day).   Rest often, relax, and take your prenatal vitamins to prevent fatigue, stress, and anemia.  Avoid chewing and smoking tobacco.  Avoid alcohol and drug use.  Take over-the-counter and prescribed medicine only as directed by your caregiver or pharmacist. You should always check with your caregiver or pharmacist before taking any new medicine, vitamin, or herbal supplement.  Know that pregnancy is possible while breastfeeding. If desired, talk to your caregiver about family planning and safe birth control methods that may be used while breastfeeding. SEEK MEDICAL CARE IF:   You feel like you want to stop breastfeeding or have become frustrated with breastfeeding.  You have painful breasts or nipples.  Your nipples are cracked or bleeding.  Your breasts are red,  tender, or warm.  You have a swollen area on either breast.  You have a fever or chills.  You have nausea or vomiting.  You have drainage from your nipples.  Your breasts do not become full before feedings by the 5th day after delivery.  You feel sad and depressed.  Your baby is too sleepy to eat well.  Your baby is having trouble sleeping.   Your baby is wetting less than 3 diapers in a 24 hour period.  Your baby has less than  3 stools in a 24 hour period.  Your baby's skin or the white part of his or her eyes becomes more yellow.   Your baby is not gaining weight by 91 days of age. MAKE SURE YOU:   Understand these instructions.  Will watch your condition.  Will get help right away if you are not doing well or get worse. Document Released: 10/08/2005 Document Revised: 07/02/2012 Document Reviewed: 05/14/2012 [facility] Patient Information 2014 [place], [place].

## 2013-03-27 ENCOUNTER — Encounter: Payer: Self-pay | Admitting: Obstetrics and Gynecology

## 2013-03-27 LAB — OBSTETRIC PANEL
Basophils Absolute: 0 10*3/uL (ref 0.0–0.1)
Basophils Relative: 1 % (ref 0–1)
Eosinophils Absolute: 0.1 10*3/uL (ref 0.0–0.7)
Eosinophils Relative: 1 % (ref 0–5)
Lymphs Abs: 1 10*3/uL (ref 0.7–4.0)
MCH: 28.5 pg (ref 26.0–34.0)
MCHC: 34.2 g/dL (ref 30.0–36.0)
MCV: 83.4 fL (ref 78.0–100.0)
Neutrophils Relative %: 65 % (ref 43–77)
Platelets: 128 10*3/uL — ABNORMAL LOW (ref 150–400)
RDW: 15.3 % (ref 11.5–15.5)

## 2013-03-27 LAB — CULTURE, OB URINE

## 2013-03-30 LAB — COMPREHENSIVE METABOLIC PANEL
Albumin: 3 g/dL — ABNORMAL LOW (ref 3.5–5.2)
Alkaline Phosphatase: 46 U/L (ref 39–117)
BUN: 7 mg/dL (ref 6–23)
Calcium: 8.4 mg/dL (ref 8.4–10.5)
Creat: 0.52 mg/dL (ref 0.50–1.10)
Glucose, Bld: 79 mg/dL (ref 70–99)
Potassium: 3.8 mEq/L (ref 3.5–5.3)

## 2013-04-09 ENCOUNTER — Ambulatory Visit (INDEPENDENT_AMBULATORY_CARE_PROVIDER_SITE_OTHER): Payer: Medicaid Other | Admitting: Family Medicine

## 2013-04-09 ENCOUNTER — Encounter: Payer: Self-pay | Admitting: Family Medicine

## 2013-04-09 VITALS — BP 127/83 | Temp 97.2°F | Wt 396.9 lb

## 2013-04-09 DIAGNOSIS — O98519 Other viral diseases complicating pregnancy, unspecified trimester: Secondary | ICD-10-CM

## 2013-04-09 DIAGNOSIS — O98712 Human immunodeficiency virus [HIV] disease complicating pregnancy, second trimester: Secondary | ICD-10-CM

## 2013-04-09 LAB — POCT URINALYSIS DIP (DEVICE)
Bilirubin Urine: NEGATIVE
Ketones, ur: NEGATIVE mg/dL
Leukocytes, UA: NEGATIVE
Specific Gravity, Urine: 1.02 (ref 1.005–1.030)

## 2013-04-09 NOTE — Progress Notes (Signed)
Informal [place] for viability- +fm, +cardiac.

## 2013-04-09 NOTE — Progress Notes (Signed)
P=[id number] 

## 2013-04-09 NOTE — Progress Notes (Signed)
BP ok today.  7 # weight loss.  Cannot afford labetalol.  Improved BP with diet. Awaiting Medicaid to see Dr. Ninetta Lights.  Taking HIV meds.

## 2013-04-09 NOTE — Patient Instructions (Signed)
Pregnancy - Third Trimester The third trimester of pregnancy (the last 3 months) is a period of the most rapid growth for you and your baby. The baby approaches a length of 20 inches and a weight of 6 to 10 pounds. The baby is adding on fat and getting ready for life outside your body. While inside, babies have periods of sleeping and waking, sucking thumbs, and hiccuping. You can often feel small contractions of the uterus. This is false labor. It is also called Braxton-Hicks contractions. This is like a practice for labor. The usual problems in this stage of pregnancy include more difficulty breathing, swelling of the hands and feet from water retention, and having to urinate more often because of the uterus and baby pressing on your bladder.  PRENATAL EXAMS  Blood work may continue to be done during prenatal exams. These tests are done to check on your health and the probable health of your baby. Blood work is used to follow your blood levels (hemoglobin). Anemia (low hemoglobin) is common during pregnancy. Iron and vitamins are given to help prevent this. You may also continue to be checked for diabetes. Some of the past blood tests may be done again.  The size of the uterus is measured during each visit. This makes sure your baby is growing properly according to your pregnancy dates.  Your blood pressure is checked every prenatal visit. This is to make sure you are not getting toxemia.  Your urine is checked every prenatal visit for infection, diabetes, and protein.  Your weight is checked at each visit. This is done to make sure gains are happening at the suggested rate and that you and your baby are growing normally.  Sometimes, an ultrasound is performed to confirm the position and the proper growth and development of the baby. This is a test done that bounces harmless sound waves off the baby so your caregiver can more accurately determine a due date.  Discuss the type of pain medicine and  anesthesia you will have during your labor and delivery.  Discuss the possibility and anesthesia if a cesarean section might be necessary.  Inform your caregiver if there is any mental or physical violence at home. Sometimes, a specialized non-stress test, contraction stress test, and biophysical profile are done to make sure the baby is not having a problem. Checking the amniotic fluid surrounding the baby is called an amniocentesis. The amniotic fluid is removed by sticking a needle into the belly (abdomen). This is sometimes done near the end of pregnancy if an early delivery is required. In this case, it is done to help make sure the baby's lungs are mature enough for the baby to live outside of the womb. If the lungs are not mature and it is unsafe to deliver the baby, an injection of cortisone medicine is given to the mother 1 to 2 days before the delivery. This helps the baby's lungs mature and makes it safer to deliver the baby. CHANGES OCCURING IN THE THIRD TRIMESTER OF PREGNANCY Your body goes through many changes during pregnancy. They vary from person to person. Talk to your caregiver about changes you notice and are concerned about.  During the last trimester, you have probably had an increase in your appetite. It is normal to have cravings for certain foods. This varies from person to person and pregnancy to pregnancy.  You may begin to get stretch marks on your hips, abdomen, and breasts. These are normal changes in the body   during pregnancy. There are no exercises or medicines to take which prevent this change.  Constipation may be treated with a stool softener or adding bulk to your diet. Drinking lots of fluids, fiber in vegetables, fruits, and whole grains are helpful.  Exercising is also helpful. If you have been very active up until your pregnancy, most of these activities can be continued during your pregnancy. If you have been less active, it is helpful to start an exercise  program such as walking. Consult your caregiver before starting exercise programs.  Avoid all smoking, alcohol, non-prescribed drugs, herbs and "street drugs" during your pregnancy. These chemicals affect the formation and growth of the baby. Avoid chemicals throughout the pregnancy to ensure the delivery of a healthy infant.  Backache, varicose veins, and hemorrhoids may develop or get worse.  You will tire more easily in the third trimester, which is normal.  The baby's movements may be stronger and more often.  You may become short of breath easily.  Your belly button may stick out.  A yellow discharge may leak from your breasts called colostrum.  You may have a bloody mucus discharge. This usually occurs a few days to a week before labor begins. HOME CARE INSTRUCTIONS   Keep your caregiver's appointments. Follow your caregiver's instructions regarding medicine use, exercise, and diet.  During pregnancy, you are providing food for you and your baby. Continue to eat regular, well-balanced meals. Choose foods such as meat, fish, milk and other low fat dairy products, vegetables, fruits, and whole-grain breads and cereals. Your caregiver will tell you of the ideal weight gain.  A physical sexual relationship may be continued throughout pregnancy if there are no other problems such as early (premature) leaking of amniotic fluid from the membranes, vaginal bleeding, or belly (abdominal) pain.  Exercise regularly if there are no restrictions. Check with your caregiver if you are unsure of the safety of your exercises. Greater weight gain will occur in the last 2 trimesters of pregnancy. Exercising helps:  Control your weight.  Get you in shape for labor and delivery.  You lose weight after you deliver.  Rest a lot with legs elevated, or as needed for leg cramps or low back pain.  Wear a good support or jogging bra for breast tenderness during pregnancy. This may help if worn during  sleep. Pads or tissues may be used in the bra if you are leaking colostrum.  Do not use hot tubs, steam rooms, or saunas.  Wear your seat belt when driving. This protects you and your baby if you are in an accident.  Avoid raw meat, cat litter boxes and soil used by cats. These carry germs that can cause birth defects in the baby.  It is easier to leak urine during pregnancy. Tightening up and strengthening the pelvic muscles will help with this problem. You can practice stopping your urination while you are going to the bathroom. These are the same muscles you need to strengthen. It is also the muscles you would use if you were trying to stop from passing gas. You can practice tightening these muscles up 10 times a set and repeating this about 3 times per day. Once you know what muscles to tighten up, do not perform these exercises during urination. It is more likely to cause an infection by backing up the urine.  Ask for help if you have financial, counseling, or nutritional needs during pregnancy. Your caregiver will be able to offer counseling for these   needs as well as refer you for other special needs.  Make a list of emergency phone numbers and have them available.  Plan on getting help from family or friends when you go home from the hospital.  Make a trial run to the hospital.  Take prenatal classes with the father to understand, practice, and ask questions about the labor and delivery.  Prepare the baby's room or nursery.  Do not travel out of the city unless it is absolutely necessary and with the advice of your caregiver.  Wear only low or no heal shoes to have better balance and prevent falling. MEDICINES AND DRUG USE IN PREGNANCY  Take prenatal vitamins as directed. The vitamin should contain 1 milligram of folic acid. Keep all vitamins out of reach of children. Only a couple vitamins or tablets containing iron may be fatal to a baby or young child when ingested.  Avoid use  of all medicines, including herbs, over-the-counter medicines, not prescribed or suggested by your caregiver. Only take over-the-counter or prescription medicines for pain, discomfort, or fever as directed by your caregiver. Do not use aspirin, ibuprofen or naproxen unless approved by your caregiver.  Let your caregiver also know about herbs you may be using.  Alcohol is related to a number of birth defects. This includes fetal alcohol syndrome. All alcohol, in any form, should be avoided completely. Smoking will cause low birth rate and premature babies.  Illegal drugs are very harmful to the baby. They are absolutely forbidden. A baby born to an addicted mother will be addicted at birth. The baby will go through the same withdrawal an adult does. SEEK MEDICAL CARE IF: You have any concerns or worries during your pregnancy. It is better to call with your questions if you feel they cannot wait, rather than worry about them. SEEK IMMEDIATE MEDICAL CARE IF:   An unexplained oral temperature above 102 F (38.9 C) develops, or as your caregiver suggests.  You have leaking of fluid from the vagina. If leaking membranes are suspected, take your temperature and tell your caregiver of this when you call.  There is vaginal spotting, bleeding or passing clots. Tell your caregiver of the amount and how many pads are used.  You develop a bad smelling vaginal discharge with a change in the color from clear to white.  You develop vomiting that lasts more than 24 hours.  You develop chills or fever.  You develop shortness of breath.  You develop burning on urination.  You loose more than 2 pounds of weight or gain more than 2 pounds of weight or as suggested by your caregiver.  You notice sudden swelling of your face, hands, and feet or legs.  You develop belly (abdominal) pain. Round ligament discomfort is a common non-cancerous (benign) cause of abdominal pain in pregnancy. Your caregiver still  must evaluate you.  You develop a severe headache that does not go away.  You develop visual problems, blurred or double vision.  If you have not felt your baby move for more than 1 hour. If you think the baby is not moving as much as usual, eat something with sugar in it and lie down on your left side for an hour. The baby should move at least 4 to 5 times per hour. Call right away if your baby moves less than that.  You fall, are in a car accident, or any kind of trauma.  There is mental or physical violence at home. Document Released: 10/02/2001   Document Revised: 07/02/2012 Document Reviewed: 04/06/2009 [facility] Patient Information 2014 [place], [place].  Contraception Choices Contraception (birth control) is the use of any methods or devices to prevent pregnancy. Below are some methods to help avoid pregnancy. HORMONAL METHODS   Contraceptive implant. This is a thin, plastic tube containing progesterone hormone. It does not contain estrogen hormone. Your caregiver inserts the tube in the inner part of the upper arm. The tube can remain in place for up to 3 years. After 3 years, the implant must be removed. The implant prevents the ovaries from releasing an egg (ovulation), thickens the cervical mucus which prevents sperm from entering the uterus, and thins the lining of the inside of the uterus.  Progesterone-only injections. These injections are given every 3 months by your caregiver to prevent pregnancy. This synthetic progesterone hormone stops the ovaries from releasing eggs. It also thickens cervical mucus and changes the uterine lining. This makes it harder for sperm to survive in the uterus.  Birth control pills. These pills contain estrogen and progesterone hormone. They work by stopping the egg from forming in the ovary (ovulation). Birth control pills are prescribed by a caregiver.Birth control pills can also be used to treat heavy periods.  Minipill. This type of birth control  pill contains only the progesterone hormone. They are taken every day of each month and must be prescribed by your caregiver.  Birth control patch. The patch contains hormones similar to those in birth control pills. It must be changed once a week and is prescribed by a caregiver.  Vaginal ring. The ring contains hormones similar to those in birth control pills. It is left in the vagina for 3 weeks, removed for 1 week, and then a new one is put back in place. The patient must be comfortable inserting and removing the ring from the vagina.A caregiver's prescription is necessary.  Emergency contraception. Emergency contraceptives prevent pregnancy after unprotected sexual intercourse. This pill can be taken right after sex or up to 5 days after unprotected sex. It is most effective the sooner you take the pills after having sexual intercourse. Emergency contraceptive pills are available without a prescription. Check with your pharmacist. Do not use emergency contraception as your only form of birth control. BARRIER METHODS   Female condom. This is a thin sheath (latex or rubber) that is worn over the penis during sexual intercourse. It can be used with spermicide to increase effectiveness.  Female condom. This is a soft, loose-fitting sheath that is put into the vagina before sexual intercourse.  Diaphragm. This is a soft, latex, dome-shaped barrier that must be fitted by a caregiver. It is inserted into the vagina, along with a spermicidal jelly. It is inserted before intercourse. The diaphragm should be left in the vagina for 6 to 8 hours after intercourse.  Cervical cap. This is a round, soft, latex or plastic cup that fits over the cervix and must be fitted by a caregiver. The cap can be left in place for up to 48 hours after intercourse.  Sponge. This is a soft, circular piece of polyurethane foam. The sponge has spermicide in it. It is inserted into the vagina after wetting it and before sexual  intercourse.  Spermicides. These are chemicals that kill or block sperm from entering the cervix and uterus. They come in the form of creams, jellies, suppositories, foam, or tablets. They do not require a prescription. They are inserted into the vagina with an applicator before having sexual intercourse. The process  must be repeated every time you have sexual intercourse. INTRAUTERINE CONTRACEPTION  Intrauterine device (IUD). This is a T-shaped device that is put in a woman's uterus during a menstrual period to prevent pregnancy. There are 2 types:  Copper IUD. This type of IUD is wrapped in copper wire and is placed inside the uterus. Copper makes the uterus and fallopian tubes produce a fluid that kills sperm. It can stay in place for 10 years.  Hormone IUD. This type of IUD contains the hormone progestin (synthetic progesterone). The hormone thickens the cervical mucus and prevents sperm from entering the uterus, and it also thins the uterine lining to prevent implantation of a fertilized egg. The hormone can weaken or kill the sperm that get into the uterus. It can stay in place for 5 years. PERMANENT METHODS OF CONTRACEPTION  Female tubal ligation. This is when the woman's fallopian tubes are surgically sealed, tied, or blocked to prevent the egg from traveling to the uterus.  Female sterilization. This is when the female has the tubes that carry sperm tied off (vasectomy).This blocks sperm from entering the vagina during sexual intercourse. After the procedure, the man can still ejaculate fluid (semen). NATURAL PLANNING METHODS  Natural family planning. This is not having sexual intercourse or using a barrier method (condom, diaphragm, cervical cap) on days the woman could become pregnant.  Calendar method. This is keeping track of the length of each menstrual cycle and identifying when you are fertile.  Ovulation method. This is avoiding sexual intercourse during ovulation.  Symptothermal  method. This is avoiding sexual intercourse during ovulation, using a thermometer and ovulation symptoms.  Post-ovulation method. This is timing sexual intercourse after you have ovulated. Regardless of which type or method of contraception you choose, it is important that you use condoms to protect against the transmission of sexually transmitted diseases (STDs). Talk with your caregiver about which form of contraception is most appropriate for you. Document Released: 10/08/2005 Document Revised: 12/31/2011 Document Reviewed: 02/14/2011 [facility] Patient Information 2014 [place], [place].

## 2013-04-14 ENCOUNTER — Telehealth: Payer: Self-pay | Admitting: *Deleted

## 2013-04-14 NOTE — Telephone Encounter (Signed)
Left message requesting pt call for appt with either Dr. Drue Second or Dr. Luciana Axe due to high risk pregnancy.  [redacted] weeks gestation per [facility].

## 2013-04-15 ENCOUNTER — Telehealth: Payer: Self-pay | Admitting: *Deleted

## 2013-04-15 ENCOUNTER — Ambulatory Visit (HOSPITAL_COMMUNITY)
Admission: RE | Admit: 2013-04-15 | Discharge: 2013-04-15 | Disposition: A | Discharge status: Home / Self Care | Payer: Medicaid Other | Source: Ambulatory Visit | Attending: Advanced Practice Midwife | Admitting: Advanced Practice Midwife

## 2013-04-15 VITALS — BP 143/63 | HR 82 | Wt >= 6400 oz

## 2013-04-15 DIAGNOSIS — O10912 Unspecified pre-existing hypertension complicating pregnancy, second trimester: Secondary | ICD-10-CM

## 2013-04-15 DIAGNOSIS — Z363 Encounter for antenatal screening for malformations: Secondary | ICD-10-CM | POA: Insufficient documentation

## 2013-04-15 DIAGNOSIS — E669 Obesity, unspecified: Secondary | ICD-10-CM | POA: Insufficient documentation

## 2013-04-15 DIAGNOSIS — Z1389 Encounter for screening for other disorder: Secondary | ICD-10-CM | POA: Insufficient documentation

## 2013-04-15 DIAGNOSIS — Z21 Asymptomatic human immunodeficiency virus [HIV] infection status: Secondary | ICD-10-CM | POA: Insufficient documentation

## 2013-04-15 DIAGNOSIS — O10019 Pre-existing essential hypertension complicating pregnancy, unspecified trimester: Secondary | ICD-10-CM | POA: Insufficient documentation

## 2013-04-15 DIAGNOSIS — O358XX0 Maternal care for other (suspected) fetal abnormality and damage, not applicable or unspecified: Secondary | ICD-10-CM | POA: Insufficient documentation

## 2013-04-15 DIAGNOSIS — O34219 Maternal care for unspecified type scar from previous cesarean delivery: Secondary | ICD-10-CM | POA: Insufficient documentation

## 2013-04-15 DIAGNOSIS — I10 Essential (primary) hypertension: Secondary | ICD-10-CM

## 2013-04-15 DIAGNOSIS — O99210 Obesity complicating pregnancy, unspecified trimester: Secondary | ICD-10-CM | POA: Insufficient documentation

## 2013-04-15 DIAGNOSIS — B20 Human immunodeficiency virus [HIV] disease: Secondary | ICD-10-CM

## 2013-04-15 DIAGNOSIS — O99212 Obesity complicating pregnancy, second trimester: Secondary | ICD-10-CM

## 2013-04-15 DIAGNOSIS — O98519 Other viral diseases complicating pregnancy, unspecified trimester: Secondary | ICD-10-CM | POA: Insufficient documentation

## 2013-04-15 DIAGNOSIS — O09299 Supervision of pregnancy with other poor reproductive or obstetric history, unspecified trimester: Secondary | ICD-10-CM | POA: Insufficient documentation

## 2013-04-15 NOTE — Telephone Encounter (Signed)
Message left requesting pt call [facility] for ASAP appt w/ one of the MDs.  Expressed concern for the pt during her pregnancy.  Shared that [facility] has not filled [facility] prescription from 05/2012.

## 2013-04-20 ENCOUNTER — Encounter: Payer: Self-pay | Admitting: Infectious Diseases

## 2013-04-20 ENCOUNTER — Other Ambulatory Visit: Payer: Self-pay | Admitting: *Deleted

## 2013-04-20 DIAGNOSIS — B20 Human immunodeficiency virus [HIV] disease: Secondary | ICD-10-CM

## 2013-04-20 MED ORDER — LAMIVUDINE-ZIDOVUDINE 150-300 MG PO TABS: 1.0000 | ORAL_TABLET | Freq: Two times a day (BID) | ORAL | Status: DC

## 2013-04-20 MED ORDER — LOPINAVIR-RITONAVIR 200-50 MG PO TABS: 2.0000 | ORAL_TABLET | Freq: Two times a day (BID) | ORAL | Status: DC

## 2013-04-23 ENCOUNTER — Ambulatory Visit (INDEPENDENT_AMBULATORY_CARE_PROVIDER_SITE_OTHER): Payer: Medicaid Other | Admitting: Advanced Practice Midwife

## 2013-04-23 ENCOUNTER — Encounter: Payer: Self-pay | Admitting: *Deleted

## 2013-04-23 VITALS — BP 130/74 | Temp 99.2°F | Wt >= 6400 oz

## 2013-04-23 DIAGNOSIS — O34219 Maternal care for unspecified type scar from previous cesarean delivery: Secondary | ICD-10-CM

## 2013-04-23 DIAGNOSIS — O99210 Obesity complicating pregnancy, unspecified trimester: Secondary | ICD-10-CM

## 2013-04-23 DIAGNOSIS — O09892 Supervision of other high risk pregnancies, second trimester: Secondary | ICD-10-CM

## 2013-04-23 DIAGNOSIS — O0930 Supervision of pregnancy with insufficient antenatal care, unspecified trimester: Secondary | ICD-10-CM

## 2013-04-23 DIAGNOSIS — O98519 Other viral diseases complicating pregnancy, unspecified trimester: Secondary | ICD-10-CM

## 2013-04-23 LAB — POCT URINALYSIS DIP (DEVICE)
Glucose, UA: NEGATIVE mg/dL
Ketones, ur: NEGATIVE mg/dL
Leukocytes, UA: NEGATIVE
Nitrite: NEGATIVE

## 2013-04-23 NOTE — Patient Instructions (Signed)
Cesarean Delivery  Cesarean delivery is the birth of a baby through a cut (incision) in the abdomen and womb (uterus).  LET YOUR CAREGIVER KNOW ABOUT:  Complicationsinvolving the pregnancy.  Allergies.  Medicines taken including herbs, eyedrops, over-the-counter medicines, and creams.  Use of steroids (by mouth or creams).  Previous problems with anesthetics or numbing medicine.  Previous surgery.  History of blood clots.  History of bleeding or blood problems.  Other health problems. RISKS AND COMPLICATIONS   Bleeding.  Infection.  Blood clots.  Injury to surrounding organs.  Anesthesia problems.  Injury to the baby. BEFORE THE PROCEDURE   A tube (Foley catheter) will be placed in your bladder. The Foley catheter drains the urine from your bladder into a bag. This keeps your bladder empty during surgery.  An intravenous access tube (IV) will be placed in your arm.  Hair may be removed from your pubic area and your lower abdomen. This is to prevent infection in the incision site.  You may be given an antacid medicine to drink. This will prevent acid contents in your stomach from going into your lungs if you vomit during the surgery.  You may be given an antibiotic medicine to prevent infection. PROCEDURE   You may be given medicine to numb the lower half of your body (regional anesthetic). If you were in labor, you may have already had an epidural in place which can be used in both labor and cesarean delivery. You may possibly be given medicine to make you sleep (general anesthetic) though this is not as common.  An incision will be made in your abdomen that extends to your uterus. There are 2 basic kinds of incisions:  The horizontal (transverse) incision. Horizontal incisions are used for most routine cesarean deliveries.  The vertical (up and down) incision. This is less commonly used. This is most often reserved for women who have a serious complication  (extreme prematurity) or under emergency situations.  The horizontal and vertical incisions may both be used at the same time. However, this is very uncommon.  Your baby will then be delivered. AFTER THE PROCEDURE   If you were awake during the surgery, you will see your baby right away. If you were asleep, you will see your baby as soon as you are awake.  You may breastfeed your baby after surgery.  You may be able to get up and walk the same day as the surgery. If you need to stay in bed for a period of time, you will receive help to turn, cough, and take deep breaths after surgery. This helps prevent lung problems such as pneumonia.  Do not get out of bed alone the first time after surgery. You will need help getting out of bed until you are able to do this by yourself.  You may be able to shower the day after your cesarean delivery. After the bandage (dressing) is taken off the incision site, a nurse will assist you to shower, if you like.  You will have pneumatic compressing hose placed on your feet or lower legs. These hose are used to prevent blood clots. When you are up and walking regularly, they will no longer be necessary.  Do not cross your legs when you sit.  Save any blood clots that you pass. If you pass a clot while on the toilet, do not flush it. Call for the nurse. Tell the nurse if you think you are bleeding too much or passing too many   clots.  Start drinking liquids and eating food as directed by your caregiver. If your stomach is not ready, drinking and eating too soon can cause an increase in bloating and swelling of your intestine and abdomen. This is very uncomfortable.  You will be given medicine as needed. Let your caregivers know if you are hurting. They want you to be comfortable. You may also be given an antibiotic to prevent an infection.  Your IV will be taken out when you are drinking a reasonable amount of fluids. The Foley catheter is taken out when  you are up and walking.  If your blood type is Rh negative and your baby's blood type is Rh positive, you will be given a shot of anti-D immune globulin. This shot prevents you from having Rh problems with a future pregnancy. You should get the shot even if you had your tubes tied (tubal ligation).  If you are allowed to take the baby for a walk, place the baby in the bassinet and push it. Do not carry your baby in your arms. Document Released: 10/08/2005 Document Revised: 12/31/2011 Document Reviewed: 02/02/2011 [employer] Patient Information 2014 [employer].  

## 2013-04-23 NOTE — Progress Notes (Signed)
Pulse: 77

## 2013-04-23 NOTE — Progress Notes (Signed)
No PIH Sx. Has HIV meds and appt 05/03/13.  Growth [place] scheduled.

## 2013-04-27 ENCOUNTER — Other Ambulatory Visit: Payer: Medicaid Other

## 2013-04-27 DIAGNOSIS — Z113 Encounter for screening for infections with a predominantly sexual mode of transmission: Secondary | ICD-10-CM

## 2013-04-27 DIAGNOSIS — B20 Human immunodeficiency virus [HIV] disease: Secondary | ICD-10-CM

## 2013-04-27 LAB — CBC WITH DIFFERENTIAL/PLATELET
Basophils Absolute: 0 10*3/uL (ref 0.0–0.1)
Basophils Relative: 0 % (ref 0–1)
Eosinophils Relative: 1 % (ref 0–5)
Lymphocytes Relative: 31 % (ref 12–46)
MCHC: 34.2 g/dL (ref 30.0–36.0)
Neutro Abs: 2.5 10*3/uL (ref 1.7–7.7)
Platelets: 158 10*3/uL (ref 150–400)
RDW: 15.5 % (ref 11.5–15.5)
WBC: 4.2 10*3/uL (ref 4.0–10.5)

## 2013-04-27 LAB — RPR

## 2013-04-27 LAB — COMPLETE METABOLIC PANEL WITH GFR
ALT: <8 U/L (ref 0–35)
AST: 11 U/L (ref 0–37)
Alkaline Phosphatase: 51 U/L (ref 39–117)
Calcium: 8.3 mg/dL — ABNORMAL LOW (ref 8.4–10.5)
Chloride: 108 mEq/L (ref 96–112)
Creat: 0.61 mg/dL (ref 0.50–1.10)
Potassium: 3.8 mEq/L (ref 3.5–5.3)

## 2013-04-27 NOTE — Addendum Note (Signed)
Addended by: Starleen Arms D on: 04/27/2013 11:00 AM   Modules accepted: Orders

## 2013-04-27 NOTE — Addendum Note (Signed)
Addended bySteva Colder on: 04/27/2013 11:14 AM   Modules accepted: Orders

## 2013-04-28 LAB — HIV-1 RNA ULTRAQUANT REFLEX TO GENTYP+: HIV 1 RNA Quant: 46 copies/mL — ABNORMAL HIGH (ref <20)

## 2013-04-28 LAB — T-HELPER CELL (CD4) - (RCID CLINIC ONLY)
CD4 % Helper T Cell: 32 % — ABNORMAL LOW (ref 33–55)
CD4 T Cell Abs: 530 uL (ref 400–2700)

## 2013-05-04 ENCOUNTER — Other Ambulatory Visit: Payer: Self-pay | Admitting: Licensed Clinical Social Worker

## 2013-05-07 ENCOUNTER — Ambulatory Visit (INDEPENDENT_AMBULATORY_CARE_PROVIDER_SITE_OTHER): Payer: Medicaid Other | Admitting: Family

## 2013-05-07 VITALS — BP 151/79 | Temp 97.6°F | Wt >= 6400 oz

## 2013-05-07 DIAGNOSIS — O09899 Supervision of other high risk pregnancies, unspecified trimester: Secondary | ICD-10-CM

## 2013-05-07 DIAGNOSIS — O09892 Supervision of other high risk pregnancies, second trimester: Secondary | ICD-10-CM

## 2013-05-07 LAB — POCT URINALYSIS DIP (DEVICE)
Bilirubin Urine: NEGATIVE
Glucose, UA: NEGATIVE mg/dL
Ketones, ur: NEGATIVE mg/dL
Protein, ur: NEGATIVE mg/dL
Specific Gravity, Urine: >=1.03 (ref 1.005–1.030)

## 2013-05-07 MED ORDER — INTEGRA F 125-1 MG PO CAPS: 1.0000 | ORAL_CAPSULE | Freq: Every day | ORAL | Status: DC

## 2013-05-07 NOTE — Progress Notes (Signed)
Pulse: 77

## 2013-05-07 NOTE — Progress Notes (Signed)
No questions or concerns; appt with ID is tomorrow per patient; discussed hgb, RX Integra sent to pharmacy; reviewed growth [place] from 04/15/13 (67%).  Discussed BTL, pt and spouse decided against it at this time.

## 2013-05-08 ENCOUNTER — Encounter: Payer: Self-pay | Admitting: Infectious Diseases

## 2013-05-08 ENCOUNTER — Ambulatory Visit: Payer: Medicaid Other | Admitting: Infectious Diseases

## 2013-05-08 ENCOUNTER — Telehealth: Payer: Self-pay

## 2013-05-08 NOTE — Telephone Encounter (Signed)
Pt called to cancel appointment this morning. Message was left on voicemail.   I called patient and left message for her to call the office for a walk in appointment.  She needs to be seen by physician as soon as possible due to pregnancy.     Laurell Josephs, RN

## 2013-05-11 ENCOUNTER — Inpatient Hospital Stay (HOSPITAL_COMMUNITY)
Admission: AD | Admit: 2013-05-11 | Discharge: 2013-05-11 | Disposition: A | Discharge status: Home / Self Care | Payer: Medicaid Other | Source: Ambulatory Visit | Attending: Obstetrics & Gynecology | Admitting: Obstetrics & Gynecology

## 2013-05-11 ENCOUNTER — Encounter (HOSPITAL_COMMUNITY): Payer: Self-pay | Admitting: *Deleted

## 2013-05-11 DIAGNOSIS — O34219 Maternal care for unspecified type scar from previous cesarean delivery: Secondary | ICD-10-CM

## 2013-05-11 DIAGNOSIS — O10912 Unspecified pre-existing hypertension complicating pregnancy, second trimester: Secondary | ICD-10-CM

## 2013-05-11 DIAGNOSIS — O98719 Human immunodeficiency virus [HIV] disease complicating pregnancy, unspecified trimester: Secondary | ICD-10-CM

## 2013-05-11 DIAGNOSIS — O09899 Supervision of other high risk pregnancies, unspecified trimester: Secondary | ICD-10-CM

## 2013-05-11 DIAGNOSIS — O3421 Maternal care for scar from previous cesarean delivery: Secondary | ICD-10-CM

## 2013-05-11 DIAGNOSIS — O0932 Supervision of pregnancy with insufficient antenatal care, second trimester: Secondary | ICD-10-CM

## 2013-05-11 DIAGNOSIS — N764 Abscess of vulva: Secondary | ICD-10-CM | POA: Insufficient documentation

## 2013-05-11 DIAGNOSIS — O99212 Obesity complicating pregnancy, second trimester: Secondary | ICD-10-CM

## 2013-05-11 DIAGNOSIS — Z21 Asymptomatic human immunodeficiency virus [HIV] infection status: Secondary | ICD-10-CM | POA: Insufficient documentation

## 2013-05-11 LAB — WET PREP, GENITAL
Clue Cells Wet Prep HPF POC: NONE SEEN
Trich, Wet Prep: NONE SEEN
Yeast Wet Prep HPF POC: NONE SEEN

## 2013-05-11 MED ORDER — CEPHALEXIN 500 MG PO CAPS: 500.0000 mg | ORAL_CAPSULE | Freq: Four times a day (QID) | ORAL | Status: DC

## 2013-05-11 MED ORDER — CEPHALEXIN 500 MG PO CAPS
500.0000 mg | ORAL_CAPSULE | Freq: Once | ORAL | Status: AC
  Administered 2013-05-11: 500 mg via ORAL
  Filled 2013-05-11: qty 1

## 2013-05-11 NOTE — MAU Provider Note (Signed)

## 2013-05-11 NOTE — MAU Provider Note (Signed)
History   G4P2 @ 30 wks in with c/o boil on l labia that started on Friday. HX pt is HIV pos.  CSN: [id number]  Arrival date and time: 05/11/13 0340   None     Chief Complaint  Patient presents with  . Recurrent Skin Infections    "boils on genitals"   HPI  OB History   Grav Para Term Preterm Abortions TAB SAB Ect Mult Living   4 2 2  1  1   2       Past Medical History  Diagnosis Date  . HIV infection   . Hypertension   . Thyroid disease   . Genital herpes   . Syphilis     was treated  . Obesity   . HIV (human immunodeficiency virus infection)   . Anesthesia complication-postpartum     stopped breathing after spinal with c/s, in ICU  . Complication of anesthesia   . Gestational diabetes     Past Surgical History  Procedure Laterality Date  . Cesarean section    . Wisdom tooth extraction      Family History  Problem Relation Age of Onset  . Anesthesia problems Neg Hx   . Hypotension Neg Hx   . Malignant hyperthermia Neg Hx   . Pseudochol deficiency Neg Hx   . Diabetes Maternal Grandmother     History  Substance Use Topics  . Smoking status: Current Every Day Smoker -- 0.25 packs/day    Types: Cigarettes  . Smokeless tobacco: Never Used  . Alcohol Use: No    Allergies:  Allergies  Allergen Reactions  . Sulfonamide Derivatives Anaphylaxis  . Other     Steroids, unknown reaction    Prescriptions prior to admission  Medication Sig Dispense Refill  . Fe Fum-FePoly-FA-Vit C-Vit B3 (INTEGRA F) 125-1 MG CAPS Take 1 tablet by mouth daily.  30 capsule  1  . labetalol (NORMODYNE) 200 MG tablet Take 200 mg by mouth 2 (two) times daily.      Marland Kitchen lamiVUDine-zidovudine (COMBIVIR) 150-300 MG per tablet Take 1 tablet by mouth 2 (two) times daily.  60 tablet  0  . lopinavir-ritonavir (KALETRA) 200-50 MG per tablet Take 2 tablets by mouth 2 (two) times daily.  120 tablet  0  . Prenat w/o A Vit-FeFum-FePo-FA (CONCEPT OB) 130-92.4-1 MG CAPS Take 1 tablet by mouth  daily.  30 capsule  12    Review of Systems  Constitutional: Negative.   HENT: Negative.   Eyes: Negative.   Respiratory: Negative.   Cardiovascular: Negative.   Gastrointestinal: Negative.   Genitourinary: Negative.   Musculoskeletal: Negative.   Skin: Negative.        sm erythemia area l labia, no palpable abcess noted.  Neurological: Negative.   Endo/Heme/Allergies: Negative.   Psychiatric/Behavioral: Negative.    Physical Exam   Blood pressure 136/67, pulse 82, temperature 100.3 F (37.9 C), temperature source Oral, resp. rate 22, height 5\' 10"  (1.778 m), weight 411 lb 12.8 oz (186.791 kg), last menstrual period 10/16/2012.  Physical Exam  Constitutional: She is oriented to person, place, and time. She appears well-developed and well-nourished.  Morbid obese  HENT:  Head: Normocephalic.  Neck: Normal range of motion.  Cardiovascular: Normal rate.   Respiratory: Effort normal and breath sounds normal.  GI: Soft. Bowel sounds are normal.  Genitourinary:  sm eryhemia area apx 1 cm l labia, no palpable abcess noted.  Musculoskeletal: Normal range of motion.  Neurological: She is alert and oriented to  person, place, and time. She has normal reflexes.  Skin: Skin is warm and dry.  Psychiatric: She has a normal mood and affect. Her behavior is normal. Judgment and thought content normal.    MAU Course  Procedures  MDM Wet prep obtained.  Assessment and Plan  tx pending wet prep and start keflex po.  Eileen [name] DARLENE 05/11/2013, 4:38 AM

## 2013-05-13 ENCOUNTER — Ambulatory Visit (HOSPITAL_COMMUNITY): Payer: Medicaid Other

## 2013-05-15 ENCOUNTER — Encounter: Payer: Self-pay | Admitting: *Deleted

## 2013-05-15 DIAGNOSIS — O09899 Supervision of other high risk pregnancies, unspecified trimester: Secondary | ICD-10-CM

## 2013-05-18 ENCOUNTER — Other Ambulatory Visit: Payer: Self-pay | Admitting: Infectious Diseases

## 2013-05-19 ENCOUNTER — Inpatient Hospital Stay (HOSPITAL_COMMUNITY)
Admission: AD | Admit: 2013-05-19 | Discharge: 2013-05-19 | Disposition: A | Discharge status: Home / Self Care | Payer: Medicaid Other | Source: Ambulatory Visit | Attending: Family Medicine | Admitting: Family Medicine

## 2013-05-19 ENCOUNTER — Encounter (HOSPITAL_COMMUNITY): Payer: Self-pay | Admitting: *Deleted

## 2013-05-19 DIAGNOSIS — N949 Unspecified condition associated with female genital organs and menstrual cycle: Secondary | ICD-10-CM | POA: Insufficient documentation

## 2013-05-19 DIAGNOSIS — N764 Abscess of vulva: Secondary | ICD-10-CM | POA: Insufficient documentation

## 2013-05-19 LAB — WET PREP, GENITAL: Yeast Wet Prep HPF POC: NONE SEEN

## 2013-05-19 MED ORDER — LIDOCAINE 5 % EX OINT
TOPICAL_OINTMENT | CUTANEOUS | Status: AC
  Administered 2013-05-19: 03:00:00 via TOPICAL
  Filled 2013-05-19: qty 35.44

## 2013-05-19 MED ORDER — OXYCODONE-ACETAMINOPHEN 5-325 MG PO TABS: 1.0000 | ORAL_TABLET | Freq: Four times a day (QID) | ORAL | Status: DC | PRN

## 2013-05-19 MED ORDER — CEPHALEXIN 500 MG PO CAPS: 500.0000 mg | ORAL_CAPSULE | Freq: Four times a day (QID) | ORAL | Status: AC

## 2013-05-19 NOTE — MAU Provider Note (Signed)
Chief Complaint:  Recurrent Skin Infections   None     HPI: Eileen [name] is a 27 y.o. [phone] at [redacted]w[redacted]d who presents to maternity admissions reporting boil on left labia worsening over last few days.  She was seen in MAU on 05/11/13 for milder symptoms and was started on Keflex.  She also reports white vaginal discharge today.  She reports good fetal movement, denies cramping/contractions, LOF, vaginal bleeding, vaginal itching/burning, urinary symptoms, h/a, dizziness, n/v, or fever/chills.  Pt is HIV positive.   Past Medical History: Past Medical History  Diagnosis Date  . HIV infection   . Hypertension   . Thyroid disease   . Genital herpes   . Syphilis     was treated  . Obesity   . HIV (human immunodeficiency virus infection)   . Anesthesia complication-postpartum     stopped breathing after spinal with c/s, in ICU  . Complication of anesthesia   . Gestational diabetes     Past obstetric history: OB History   Grav Para Term Preterm Abortions TAB SAB Ect Mult Living   4 2 2  1  1   2      # Outc Date GA Lbr Len/2nd Wgt Sex Del Anes PTL Lv   1 TRM 4/07 [redacted]w[redacted]d  2.725DG([id number]) M CS Spinal  Yes   Comments: GDM, HTN   2 TRM 10/11 [redacted]w[redacted]d   F LTCS Spinal  Yes   3 SAB 2/13           4 CUR               Past Surgical History: Past Surgical History  Procedure Laterality Date  . Cesarean section    . Wisdom tooth extraction      Family History: Family History  Problem Relation Age of Onset  . Anesthesia problems Neg Hx   . Hypotension Neg Hx   . Malignant hyperthermia Neg Hx   . Pseudochol deficiency Neg Hx   . Diabetes Maternal Grandmother     Social History: History  Substance Use Topics  . Smoking status: Current Every Day Smoker -- 0.25 packs/day    Types: Cigarettes  . Smokeless tobacco: Never Used  . Alcohol Use: No    Allergies:  Allergies  Allergen Reactions  . Sulfonamide Derivatives Anaphylaxis  . Other     Steroids, unknown reaction     Meds:  Prescriptions prior to admission  Medication Sig Dispense Refill  . cephALEXin (KEFLEX) 500 MG capsule Take 1 capsule (500 mg total) by mouth every 6 (six) hours.  40 capsule  0  . Fe Fum-FePoly-FA-Vit C-Vit B3 (INTEGRA F) 125-1 MG CAPS Take 1 tablet by mouth daily.  30 capsule  1  . labetalol (NORMODYNE) 200 MG tablet Take 200 mg by mouth 2 (two) times daily.      Marland Kitchen lamiVUDine-zidovudine (COMBIVIR) 150-300 MG per tablet Take 1 tablet by mouth 2 (two) times daily.  60 tablet  0  . lopinavir-ritonavir (KALETRA) 200-50 MG per tablet Take 2 tablets by mouth 2 (two) times daily.  120 tablet  0  . Prenat w/o A Vit-FeFum-FePo-FA (CONCEPT OB) 130-92.4-1 MG CAPS Take 1 tablet by mouth daily.  30 capsule  12    ROS: Pertinent findings in history of present illness.  Physical Exam  Blood pressure 160/87, pulse 86, temperature 97.9 F (36.6 C), temperature source Oral, resp. rate 18, height 5\' 10"  (1.778 m), weight 187.789 kg (414 lb), last menstrual period 10/16/2012. GENERAL: Well-developed,  well-nourished female in no acute distress.  HEENT: normocephalic HEART: normal rate RESP: normal effort ABDOMEN: Soft, non-tender, gravid appropriate for gestational age EXTREMITIES: Nontender, no edema NEURO: alert and oriented Pelvic exam: Cervix pink, visually closed, without lesion, scant white creamy discharge, vaginal walls and external genitalia normal    FHT: 135.  Difficult to obtain continuous tracing r/t pt body habitus.     Vulvar abscess I&D Enlarged abscess palpated in left labia at 3'oclock.  Written informed consent was obtained.  Discussed complications and possible outcomes of procedure including recurrence of cyst, scarring leading to infecton, bleeding, dyspareunia, distortion of anatomy.  Patient was examined in the dorsal lithotomy position and mass was identified.  The area was prepped with Iodine and draped in a sterile manner. 5% topical lidocaine ointment was used on  area prior to procedure.  A 1 cm incision was made using a sterile scapel. Upon palpation of the mass, a moderate amount of bloody purulent drainage was expressed through the incision. A hemostat was used to break up loculations, which resulted in expression of more bloody purulent drainage.  Patient tolerated the procedure well, reported feeling " a lot better." - Recommended Sitz baths bid and Motrin and Percocet were given  prn pain.   She was told to call to be examined if she experiences increasing swelling, pain, vaginal discharge, or fever.   Results for orders placed during the hospital encounter of 05/19/13 (from the past 72 hour(s))  WET PREP, GENITAL     Status: Abnormal   Collection Time    05/19/13  3:45 AM      Result Value Range   Yeast Wet Prep HPF POC NONE SEEN  NONE SEEN   Trich, Wet Prep NONE SEEN  NONE SEEN   Clue Cells Wet Prep HPF POC NONE SEEN  NONE SEEN   WBC, Wet Prep HPF POC FEW (*) NONE SEEN   Comment: FEW BACTERIA SEEN    Assessment: 1. Boil of vulva     Plan: Discharge home Finish last day of current Keflex prescription, then Keflex QID x10 days to start Percocet x15 tabs F/U as scheduled in [facility] Return to MAU as needed    Sharen Counter Certified Nurse-Midwife 05/19/2013 2:43 AM

## 2013-05-19 NOTE — MAU Note (Signed)
Boil on the inner left labia that has gotten bigger and more irritating. Was started on Keflex last Sunday but it has not helped. Patient complains of white vaginal discharge.

## 2013-05-21 ENCOUNTER — Telehealth: Payer: Self-pay | Admitting: Family Medicine

## 2013-05-21 ENCOUNTER — Other Ambulatory Visit: Payer: Medicaid Other

## 2013-05-21 ENCOUNTER — Ambulatory Visit (HOSPITAL_COMMUNITY): Payer: Medicaid Other

## 2013-05-21 NOTE — Telephone Encounter (Signed)
Called to inform pt to call us back about missed appointment. We need to reschedule her.

## 2013-05-21 NOTE — MAU Provider Note (Signed)
Chart reviewed and agree with management and plan.  

## 2013-05-25 ENCOUNTER — Ambulatory Visit (HOSPITAL_COMMUNITY)
Admission: RE | Admit: 2013-05-25 | Discharge: 2013-05-25 | Disposition: A | Discharge status: Home / Self Care | Payer: Medicaid Other | Source: Ambulatory Visit | Attending: Advanced Practice Midwife | Admitting: Advanced Practice Midwife

## 2013-05-25 ENCOUNTER — Other Ambulatory Visit (HOSPITAL_COMMUNITY): Payer: Self-pay | Admitting: Maternal and Fetal Medicine

## 2013-05-25 VITALS — BP 134/85 | HR 79 | Wt >= 6400 oz

## 2013-05-25 DIAGNOSIS — O10912 Unspecified pre-existing hypertension complicating pregnancy, second trimester: Secondary | ICD-10-CM

## 2013-05-25 DIAGNOSIS — I10 Essential (primary) hypertension: Secondary | ICD-10-CM

## 2013-05-25 DIAGNOSIS — O99330 Smoking (tobacco) complicating pregnancy, unspecified trimester: Secondary | ICD-10-CM | POA: Insufficient documentation

## 2013-05-25 DIAGNOSIS — E669 Obesity, unspecified: Secondary | ICD-10-CM | POA: Insufficient documentation

## 2013-05-25 DIAGNOSIS — O99212 Obesity complicating pregnancy, second trimester: Secondary | ICD-10-CM

## 2013-05-25 DIAGNOSIS — O98519 Other viral diseases complicating pregnancy, unspecified trimester: Secondary | ICD-10-CM | POA: Insufficient documentation

## 2013-05-25 DIAGNOSIS — O99210 Obesity complicating pregnancy, unspecified trimester: Secondary | ICD-10-CM | POA: Insufficient documentation

## 2013-05-25 DIAGNOSIS — A6000 Herpesviral infection of urogenital system, unspecified: Secondary | ICD-10-CM | POA: Insufficient documentation

## 2013-05-25 DIAGNOSIS — O34219 Maternal care for unspecified type scar from previous cesarean delivery: Secondary | ICD-10-CM | POA: Insufficient documentation

## 2013-05-25 DIAGNOSIS — B20 Human immunodeficiency virus [HIV] disease: Secondary | ICD-10-CM

## 2013-05-25 DIAGNOSIS — O09299 Supervision of pregnancy with other poor reproductive or obstetric history, unspecified trimester: Secondary | ICD-10-CM | POA: Insufficient documentation

## 2013-05-25 DIAGNOSIS — O10019 Pre-existing essential hypertension complicating pregnancy, unspecified trimester: Secondary | ICD-10-CM | POA: Insufficient documentation

## 2013-05-25 DIAGNOSIS — Z21 Asymptomatic human immunodeficiency virus [HIV] infection status: Secondary | ICD-10-CM | POA: Insufficient documentation

## 2013-05-25 NOTE — Progress Notes (Signed)
Eileen [name]  was seen today for an ultrasound appointment.  See full report in [facility].  Impression: Single IUP at 32 6/7 weeks Follow up due to chronic hypertension, morbid obesity and HIV Interval growth is appropriate (65th %tile) Normal amniotic flud volume Active fetus with BPP of 8/8  Recommendations: Recommend 2x weekly NSTs  Follow up ultrasound for growth in 4 weeks.  Alpha Gula, MD

## 2013-05-28 ENCOUNTER — Encounter: Payer: Self-pay | Admitting: Family Medicine

## 2013-05-28 ENCOUNTER — Ambulatory Visit (INDEPENDENT_AMBULATORY_CARE_PROVIDER_SITE_OTHER): Payer: Medicaid Other | Admitting: Family Medicine

## 2013-05-28 VITALS — BP 138/76 | Temp 97.6°F | Wt >= 6400 oz

## 2013-05-28 DIAGNOSIS — O10013 Pre-existing essential hypertension complicating pregnancy, third trimester: Secondary | ICD-10-CM

## 2013-05-28 DIAGNOSIS — O10019 Pre-existing essential hypertension complicating pregnancy, unspecified trimester: Secondary | ICD-10-CM

## 2013-05-28 NOTE — Progress Notes (Signed)
P=88 

## 2013-05-28 NOTE — Progress Notes (Signed)
NST reviewed and reactive. Pt. Reports she is taking kaletra and combivir--at [facility] on [address]. Last viral load 46 on 7/7.  Will query ID as to when to repeat.  Does not have f/u with them until post delivery.

## 2013-05-28 NOTE — Patient Instructions (Signed)

## 2013-06-11 ENCOUNTER — Other Ambulatory Visit: Payer: Medicaid Other

## 2013-06-23 ENCOUNTER — Encounter (HOSPITAL_COMMUNITY): Payer: Self-pay | Admitting: Pharmacist

## 2013-06-23 ENCOUNTER — Other Ambulatory Visit (HOSPITAL_COMMUNITY): Payer: Self-pay | Admitting: Maternal and Fetal Medicine

## 2013-06-23 ENCOUNTER — Ambulatory Visit (HOSPITAL_COMMUNITY)
Admission: RE | Admit: 2013-06-23 | Discharge: 2013-06-23 | Disposition: A | Discharge status: Home / Self Care | Payer: Medicaid Other | Source: Ambulatory Visit | Attending: Obstetrics & Gynecology | Admitting: Obstetrics & Gynecology

## 2013-06-23 VITALS — BP 154/81 | HR 83 | Wt >= 6400 oz

## 2013-06-23 DIAGNOSIS — O99330 Smoking (tobacco) complicating pregnancy, unspecified trimester: Secondary | ICD-10-CM | POA: Insufficient documentation

## 2013-06-23 DIAGNOSIS — O10019 Pre-existing essential hypertension complicating pregnancy, unspecified trimester: Secondary | ICD-10-CM | POA: Insufficient documentation

## 2013-06-23 DIAGNOSIS — O34219 Maternal care for unspecified type scar from previous cesarean delivery: Secondary | ICD-10-CM | POA: Insufficient documentation

## 2013-06-23 DIAGNOSIS — I10 Essential (primary) hypertension: Secondary | ICD-10-CM

## 2013-06-23 DIAGNOSIS — O98519 Other viral diseases complicating pregnancy, unspecified trimester: Secondary | ICD-10-CM | POA: Insufficient documentation

## 2013-06-23 DIAGNOSIS — O99212 Obesity complicating pregnancy, second trimester: Secondary | ICD-10-CM

## 2013-06-23 DIAGNOSIS — O0932 Supervision of pregnancy with insufficient antenatal care, second trimester: Secondary | ICD-10-CM

## 2013-06-23 DIAGNOSIS — O09299 Supervision of pregnancy with other poor reproductive or obstetric history, unspecified trimester: Secondary | ICD-10-CM | POA: Insufficient documentation

## 2013-06-23 DIAGNOSIS — O10912 Unspecified pre-existing hypertension complicating pregnancy, second trimester: Secondary | ICD-10-CM

## 2013-06-23 DIAGNOSIS — B20 Human immunodeficiency virus [HIV] disease: Secondary | ICD-10-CM

## 2013-06-23 DIAGNOSIS — E669 Obesity, unspecified: Secondary | ICD-10-CM | POA: Insufficient documentation

## 2013-06-23 DIAGNOSIS — Z21 Asymptomatic human immunodeficiency virus [HIV] infection status: Secondary | ICD-10-CM | POA: Insufficient documentation

## 2013-06-23 DIAGNOSIS — A6000 Herpesviral infection of urogenital system, unspecified: Secondary | ICD-10-CM | POA: Insufficient documentation

## 2013-06-23 NOTE — Progress Notes (Signed)
Ms. Nazir was seen for ultrasound appointment today.  Please see [facility] report for details.

## 2013-06-25 ENCOUNTER — Inpatient Hospital Stay (HOSPITAL_COMMUNITY): Admission: RE | Admit: 2013-06-25 | Payer: Medicaid Other | Source: Ambulatory Visit

## 2013-06-25 ENCOUNTER — Encounter: Payer: Self-pay | Admitting: Obstetrics and Gynecology

## 2013-06-25 ENCOUNTER — Other Ambulatory Visit (HOSPITAL_COMMUNITY): Payer: Medicaid Other

## 2013-06-25 ENCOUNTER — Ambulatory Visit (INDEPENDENT_AMBULATORY_CARE_PROVIDER_SITE_OTHER): Payer: Medicaid Other | Admitting: Obstetrics and Gynecology

## 2013-06-25 VITALS — BP 119/56 | Wt >= 6400 oz

## 2013-06-25 DIAGNOSIS — O10912 Unspecified pre-existing hypertension complicating pregnancy, second trimester: Secondary | ICD-10-CM

## 2013-06-25 DIAGNOSIS — O10019 Pre-existing essential hypertension complicating pregnancy, unspecified trimester: Secondary | ICD-10-CM

## 2013-06-25 NOTE — Progress Notes (Signed)
P = 66  Pt had [place] for growth and BPP @ [facility] on 9/2.   Pt refused to stay for entire prenatal visit- did not see MD. She stated that she had to be at work at 1000 and could not stay past 0930.  Pt left after NST completed.  Dr. Jolayne Panther notified.

## 2013-06-25 NOTE — Progress Notes (Signed)
9/2 EFW 3149 gm (74%tile), BPP 8/8. Patient could not stay for ob visit today as she had to rush to go to work

## 2013-06-29 ENCOUNTER — Ambulatory Visit (INDEPENDENT_AMBULATORY_CARE_PROVIDER_SITE_OTHER): Payer: Medicaid Other | Admitting: *Deleted

## 2013-06-29 VITALS — BP 117/65

## 2013-06-29 DIAGNOSIS — O10019 Pre-existing essential hypertension complicating pregnancy, unspecified trimester: Secondary | ICD-10-CM

## 2013-06-29 DIAGNOSIS — O98519 Other viral diseases complicating pregnancy, unspecified trimester: Secondary | ICD-10-CM

## 2013-06-29 DIAGNOSIS — O10013 Pre-existing essential hypertension complicating pregnancy, third trimester: Secondary | ICD-10-CM

## 2013-06-29 NOTE — Progress Notes (Signed)
P = 76  HIV viral load and genotype drawn today.  Discussed pt's contraception plans after delivery- she stated that she does not want any more children and was going to have BTS but her husband does not want her to because he may want more children. Pt confirms that she definitely does not want more children but has decided to have Nexplanon.

## 2013-06-30 ENCOUNTER — Other Ambulatory Visit: Payer: Self-pay | Admitting: Infectious Diseases

## 2013-06-30 DIAGNOSIS — B20 Human immunodeficiency virus [HIV] disease: Secondary | ICD-10-CM

## 2013-06-30 LAB — T-HELPER CELLS (CD4) COUNT (NOT AT ARMC)
Absolute CD4: 360 /uL — ABNORMAL LOW (ref 381–1469)
Total Lymphocyte: 18 % (ref 12–46)
WBC, lymph enumeration: 6.9 10*3/uL (ref 4.0–10.5)

## 2013-07-01 ENCOUNTER — Other Ambulatory Visit: Payer: Self-pay | Admitting: Obstetrics and Gynecology

## 2013-07-01 LAB — HIV-1 RNA ULTRAQUANT REFLEX TO GENTYP+: HIV 1 RNA Quant: <20 copies/mL (ref <20)

## 2013-07-02 ENCOUNTER — Other Ambulatory Visit: Payer: Medicaid Other

## 2013-07-02 LAB — HIV-1 GENOTYPR PLUS

## 2013-07-04 ENCOUNTER — Encounter (HOSPITAL_COMMUNITY): Payer: Self-pay | Admitting: Pharmacy Technician

## 2013-07-06 ENCOUNTER — Other Ambulatory Visit: Payer: Medicaid Other

## 2013-07-06 ENCOUNTER — Encounter (HOSPITAL_COMMUNITY): Payer: Self-pay

## 2013-07-06 ENCOUNTER — Encounter (HOSPITAL_COMMUNITY)
Admission: RE | Admit: 2013-07-06 | Discharge: 2013-07-06 | Disposition: A | Discharge status: Home / Self Care | Payer: Medicaid Other | Source: Ambulatory Visit | Attending: Obstetrics & Gynecology | Admitting: Obstetrics & Gynecology

## 2013-07-06 ENCOUNTER — Telehealth: Payer: Self-pay | Admitting: *Deleted

## 2013-07-06 ENCOUNTER — Inpatient Hospital Stay (HOSPITAL_COMMUNITY)
Admission: AD | Admit: 2013-07-06 | Discharge: 2013-07-06 | Disposition: A | Discharge status: Home / Self Care | Payer: Medicaid Other | Source: Ambulatory Visit | Attending: Obstetrics & Gynecology | Admitting: Obstetrics & Gynecology

## 2013-07-06 VITALS — BP 119/71 | HR 82 | Resp 20 | Ht 69.5 in | Wt >= 6400 oz

## 2013-07-06 DIAGNOSIS — N764 Abscess of vulva: Secondary | ICD-10-CM

## 2013-07-06 DIAGNOSIS — O10912 Unspecified pre-existing hypertension complicating pregnancy, second trimester: Secondary | ICD-10-CM

## 2013-07-06 DIAGNOSIS — O2390 Unspecified genitourinary tract infection in pregnancy, unspecified trimester: Secondary | ICD-10-CM | POA: Insufficient documentation

## 2013-07-06 DIAGNOSIS — O0932 Supervision of pregnancy with insufficient antenatal care, second trimester: Secondary | ICD-10-CM

## 2013-07-06 DIAGNOSIS — O34219 Maternal care for unspecified type scar from previous cesarean delivery: Secondary | ICD-10-CM

## 2013-07-06 DIAGNOSIS — O99212 Obesity complicating pregnancy, second trimester: Secondary | ICD-10-CM

## 2013-07-06 DIAGNOSIS — O98519 Other viral diseases complicating pregnancy, unspecified trimester: Secondary | ICD-10-CM | POA: Insufficient documentation

## 2013-07-06 DIAGNOSIS — O10019 Pre-existing essential hypertension complicating pregnancy, unspecified trimester: Secondary | ICD-10-CM | POA: Insufficient documentation

## 2013-07-06 DIAGNOSIS — Z21 Asymptomatic human immunodeficiency virus [HIV] infection status: Secondary | ICD-10-CM | POA: Insufficient documentation

## 2013-07-06 HISTORY — DX: Nausea with vomiting, unspecified: R11.2

## 2013-07-06 HISTORY — DX: Other specified postprocedural states: Z98.890

## 2013-07-06 LAB — BASIC METABOLIC PANEL
BUN: 12 mg/dL (ref 6–23)
CO2: 27 mEq/L (ref 19–32)
Chloride: 99 mEq/L (ref 96–112)
Creatinine, Ser: 0.61 mg/dL (ref 0.50–1.10)
GFR calc Af Amer: >90 mL/min (ref >90)
Glucose, Bld: 101 mg/dL — ABNORMAL HIGH (ref 70–99)
Potassium: 4.3 mEq/L (ref 3.5–5.1)

## 2013-07-06 LAB — CBC
HCT: 33.1 % — ABNORMAL LOW (ref 36.0–46.0)
Hemoglobin: 11 g/dL — ABNORMAL LOW (ref 12.0–15.0)
MCH: 30.1 pg (ref 26.0–34.0)
MCV: 90.4 fL (ref 78.0–100.0)
Platelets: 174 10*3/uL (ref 150–400)
RBC: 3.66 MIL/uL — ABNORMAL LOW (ref 3.87–5.11)
WBC: 6.7 10*3/uL (ref 4.0–10.5)

## 2013-07-06 MED ORDER — CEFAZOLIN SODIUM 10 G IJ SOLR
3.0000 g | INTRAMUSCULAR | Status: AC
  Administered 2013-07-07: 3 g via INTRAVENOUS
  Filled 2013-07-06: qty 3000

## 2013-07-06 MED ORDER — CEPHALEXIN 500 MG PO CAPS: 500.0000 mg | ORAL_CAPSULE | Freq: Four times a day (QID) | ORAL | Status: DC

## 2013-07-06 NOTE — Patient Instructions (Addendum)
   Your procedure is scheduled on:  Tuesday, 9/16  Enter through the Main Entrance of [facility] at: 830 am Pick up the phone at the desk and dial [phone] and inform us of your arrival.  Please call this number if you have any problems the morning of surgery: [phone]  Remember: Do not eat or drink after midnight: Monday Take these medicines the morning of surgery with a SIP OF WATER:   HIV meds and bp med  Do not wear jewelry, make-up, or FINGER nail polish No metal in your hair or on your body. Do not wear lotions, powders, perfumes. You may wear deodorant.  Please use your CHG wash as directed prior to surgery.  Do not shave anywhere for at least 12 hours prior to first CHG shower.  Do not bring valuables to the hospital. Contacts, dentures or bridgework may not be worn into surgery.  Leave suitcase in the car. After Surgery it may be brought to your room. For patients being admitted to the hospital, checkout time is 11:00am the day of discharge.  Home with husband Eileen [name].

## 2013-07-06 NOTE — MAU Note (Signed)
Pt reports she had a boil on her vaginal area one year ago, states we opened it and drained it and now it is back again. States she is scheduled for a C-Section tomorrow am and wants something done about the boil so that they will do her C-Section

## 2013-07-06 NOTE — Telephone Encounter (Signed)
Called pt after chart review. I informed her that since she was seen @ MAU last night and [facility] was reactive, she did not need NST as scheduled today @ 1600.  Pt confirmed that baby has been moving well. I advised pt to follow instructions that she was given at her pre-op appt earlier today.  She has repeat C/S scheduled for tomorrow.  Pt voiced understanding.

## 2013-07-06 NOTE — MAU Provider Note (Signed)
I examined pt and agree with documentation above and resident plan of care. MUHAMMAD,Rayne Loiseau  

## 2013-07-06 NOTE — Pre-Procedure Instructions (Signed)
Patient is going to bottle feed during hospital stay.

## 2013-07-06 NOTE — MAU Provider Note (Signed)
First Provider Initiated Contact with Patient 07/06/13 0208      Chief Complaint:  Antoine Poche boil  Eileen [name] is  27 y.o. [id number].  Patient's last menstrual period was 10/16/2012.Marland Kitchen  Her pregnancy status is positive.  She presents complaining of a left labial boil. Had this before in 04/2013 which required I&D and antibiotics. Report she started to feel some pain there last night and since she had it before she came in to have it treated before it gets too large. Had to have packing changes at home for her last one  Current pregnancy has been complicated by [facility] and HIV, She is currently on [place] and combivir with a last HIV count <20 on 09/08. She is also on labetalol 200mg  PO BID for BP control.    Past Medical History  Diagnosis Date   HIV infection    Hypertension    Thyroid disease    Genital herpes    Syphilis     was treated   Obesity    HIV (human immunodeficiency virus infection)    Anesthesia complication-postpartum     stopped breathing after spinal with c/s, in ICU   Complication of anesthesia    Gestational diabetes     Past Surgical History  Procedure Laterality Date   Cesarean section     Wisdom tooth extraction      Family History  Problem Relation Age of Onset   Anesthesia problems Neg Hx    Hypotension Neg Hx    Malignant hyperthermia Neg Hx    Pseudochol deficiency Neg Hx    Diabetes Maternal Grandmother     History  Substance Use Topics   Smoking status: Current Every Day Smoker -- 0.25 packs/day    Types: Cigarettes   Smokeless tobacco: Never Used   Alcohol Use: No    Allergies:  Allergies  Allergen Reactions   Sulfonamide Derivatives Anaphylaxis   Other     Steroids, unknown reaction    Prescriptions prior to admission  Medication Sig Dispense Refill   acetaminophen (TYLENOL) 325 MG tablet Take 650 mg by mouth every 6 (six) hours as needed for pain.       KALETRA 200-50 MG per tablet TAKE 2 TABLETS BY MOUTH  TWICE A DAY  120 tablet  0   labetalol (NORMODYNE) 100 MG tablet TAKE 2 TABLETS BY MOUTH TWICE A DAY  60 tablet  1   lamiVUDine-zidovudine (COMBIVIR) 150-300 MG per tablet TAKE 1 TABLET BY MOUTH TWICE A DAY  60 tablet  0   Prenatal Vit-Fe Fumarate-FA (PRENATAL MULTIVITAMIN) TABS tablet Take 1 tablet by mouth daily at 12 noon.         Review of Systems  Review of Systems  Constitutional: Negative for fever, chills, weight loss, malaise/fatigue and diaphoresis.  HENT: Negative for hearing loss, ear pain, nosebleeds, congestion, sore throat, neck pain, tinnitus and ear discharge.   Eyes: Negative for blurred vision, double vision, photophobia, pain, discharge and redness.  Respiratory: Negative for cough, hemoptysis, sputum production, shortness of breath, wheezing and stridor.   Cardiovascular: Negative for chest pain, palpitations, orthopnea,  leg swelling  Gastrointestinal: Negative for abdominal pain heartburn, nausea, vomiting, diarrhea, constipation, blood in stool Genitourinary: Negative for dysuria, urgency, frequency, hematuria and flank pain.  Musculoskeletal: Negative for myalgias, back pain, joint pain and falls.  Neurological: Negative for dizziness, tingling, tremors, sensory change, speech change, focal weakness, seizures, loss of consciousness, weakness and headaches.  Endo/Heme/Allergies: Negative for environmental allergies and polydipsia. Does  not bruise/bleed easily.  Psychiatric/Behavioral: Negative for depression, suicidal ideas, hallucinations, memory loss and substance abuse. The patient is not nervous/anxious and does not have insomnia.      Physical Exam   Blood pressure 143/79, pulse 95, temperature 98.1 F (36.7 C), temperature source Oral, resp. rate 18, height 5\' 10"  (1.778 m), weight 192.779 kg (425 lb), last menstrual period 10/16/2012, SpO2 100.00%.  General: General appearance - alert, well appearing, and in no distress and overweight Chest - clear to  auscultation, no wheezes, rales or rhonchi, symmetric air entry Heart - normal rate, regular rhythm, normal S1, S2, no murmurs, rubs, clicks or gallops Abdomen - soft, nontender, nondistended, no masses or organomegaly Pelvic -  Left lower labia with obvious swelling and pus collection with central area of oozing. No surrounding erythema or warmth. Extremities - peripheral pulses normal, no pedal edema, no clubbing or cyanosis    Labs: No results found for this or any previous visit (from the past 24 hour(s)). Imaging Studies:  US Ob Follow Up  06/23/2013   OBSTETRICAL ULTRASOUND: This exam was performed within a [facility]. The OB US report was generated in the AS system, and faxed to the ordering physician.   This report is also available in [employer] and in the [employer]. See AS Obstetric US report.  US Fetal Bpp W/o Non Stress  06/23/2013   OBSTETRICAL ULTRASOUND: This exam was performed within a [facility]. The OB US report was generated in the AS system, and faxed to the ordering physician.   This report is also available in [employer] and in the [employer]. See AS Obstetric US report.    Assessment: Patient Active Problem List   Diagnosis Date Noted   Previous cesarean section complicating pregnancy 03/26/2013   Obesity complicating pregnancy in second trimester 03/26/2013   Preexisting hypertension complicating pregnancy in second trimester, antepartum 03/26/2013   Insufficient prenatal care in second trimester 03/26/2013   Supervision of other high-risk pregnancy 03/19/2013   Maternal HIV infection during pregnancy 03/19/2013   HYPERTENSION 12/06/2006   Morbid obesity 11/12/2006   HIV INFECTION 11/06/2005    Plan: 1) Left Labial Abscess - Consent obtained and left labia numbed with 1% lidocaine. Incision made over top of boil and immediate purulent material release. Sent  wound culture. Would packed with non-iodoform gauze and tegaderm dressing applied.  - Pt started on keflex due to HIV status.  - HAS RLTCS planned for 09/16 and will be inhouse for a few days while post-op. Will do dressing changes in house during that time and set up home health for further dressing changes if needed.   Marissa Calamity

## 2013-07-07 ENCOUNTER — Encounter (HOSPITAL_COMMUNITY): Payer: Self-pay | Admitting: *Deleted

## 2013-07-07 ENCOUNTER — Inpatient Hospital Stay (HOSPITAL_COMMUNITY): Payer: Medicaid Other | Admitting: Anesthesiology

## 2013-07-07 ENCOUNTER — Encounter (HOSPITAL_COMMUNITY): Payer: Self-pay | Admitting: Anesthesiology

## 2013-07-07 ENCOUNTER — Inpatient Hospital Stay (HOSPITAL_COMMUNITY)
Admission: RE | Admit: 2013-07-07 | Discharge: 2013-07-09 | DRG: 765 | Disposition: A | Discharge status: Home / Self Care | Payer: Medicaid Other | Source: Ambulatory Visit | Attending: Obstetrics & Gynecology | Admitting: Obstetrics & Gynecology

## 2013-07-07 ENCOUNTER — Telehealth: Payer: Self-pay | Admitting: Internal Medicine

## 2013-07-07 ENCOUNTER — Encounter (HOSPITAL_COMMUNITY)
Admission: RE | Disposition: A | Discharge status: Home / Self Care | Payer: Self-pay | Source: Ambulatory Visit | Attending: Obstetrics & Gynecology

## 2013-07-07 DIAGNOSIS — O2390 Unspecified genitourinary tract infection in pregnancy, unspecified trimester: Secondary | ICD-10-CM | POA: Diagnosis present

## 2013-07-07 DIAGNOSIS — O98719 Human immunodeficiency virus [HIV] disease complicating pregnancy, unspecified trimester: Secondary | ICD-10-CM

## 2013-07-07 DIAGNOSIS — O34219 Maternal care for unspecified type scar from previous cesarean delivery: Secondary | ICD-10-CM

## 2013-07-07 DIAGNOSIS — O1002 Pre-existing essential hypertension complicating childbirth: Secondary | ICD-10-CM | POA: Diagnosis present

## 2013-07-07 DIAGNOSIS — Z21 Asymptomatic human immunodeficiency virus [HIV] infection status: Secondary | ICD-10-CM | POA: Diagnosis present

## 2013-07-07 DIAGNOSIS — O98519 Other viral diseases complicating pregnancy, unspecified trimester: Secondary | ICD-10-CM

## 2013-07-07 DIAGNOSIS — O10912 Unspecified pre-existing hypertension complicating pregnancy, second trimester: Secondary | ICD-10-CM

## 2013-07-07 DIAGNOSIS — O09899 Supervision of other high risk pregnancies, unspecified trimester: Secondary | ICD-10-CM

## 2013-07-07 DIAGNOSIS — N764 Abscess of vulva: Secondary | ICD-10-CM | POA: Diagnosis present

## 2013-07-07 DIAGNOSIS — O321XX0 Maternal care for breech presentation, not applicable or unspecified: Secondary | ICD-10-CM | POA: Diagnosis present

## 2013-07-07 DIAGNOSIS — E669 Obesity, unspecified: Secondary | ICD-10-CM | POA: Diagnosis present

## 2013-07-07 DIAGNOSIS — R062 Wheezing: Secondary | ICD-10-CM | POA: Diagnosis present

## 2013-07-07 DIAGNOSIS — O0932 Supervision of pregnancy with insufficient antenatal care, second trimester: Secondary | ICD-10-CM

## 2013-07-07 DIAGNOSIS — O99212 Obesity complicating pregnancy, second trimester: Secondary | ICD-10-CM

## 2013-07-07 LAB — CBC
MCV: 90 fL (ref 78.0–100.0)
Platelets: 184 10*3/uL (ref 150–400)
RBC: 3.59 MIL/uL — ABNORMAL LOW (ref 3.87–5.11)
RDW: 18.4 % — ABNORMAL HIGH (ref 11.5–15.5)
WBC: 7.6 10*3/uL (ref 4.0–10.5)

## 2013-07-07 LAB — PREPARE RBC (CROSSMATCH)

## 2013-07-07 SURGERY — Surgical Case
Anesthesia: Epidural | Site: Abdomen | Wound class: Clean Contaminated

## 2013-07-07 MED ORDER — KETOROLAC TROMETHAMINE 30 MG/ML IJ SOLN: 30.0000 mg | Freq: Four times a day (QID) | INTRAMUSCULAR | Status: AC | PRN

## 2013-07-07 MED ORDER — TETANUS-DIPHTH-ACELL PERTUSSIS 5-2.5-18.5 LF-MCG/0.5 IM SUSP
0.5000 mL | Freq: Once | INTRAMUSCULAR | Status: AC
  Administered 2013-07-08: 0.5 mL via INTRAMUSCULAR
  Filled 2013-07-07: qty 0.5

## 2013-07-07 MED ORDER — SIMETHICONE 80 MG PO CHEW
80.0000 mg | CHEWABLE_TABLET | Freq: Three times a day (TID) | ORAL | Status: DC
  Administered 2013-07-07 – 2013-07-09 (×6): 80 mg via ORAL

## 2013-07-07 MED ORDER — PRENATAL MULTIVITAMIN CH
1.0000 | ORAL_TABLET | Freq: Every day | ORAL | Status: DC
  Administered 2013-07-08 – 2013-07-09 (×2): 1 via ORAL
  Filled 2013-07-07 (×2): qty 1

## 2013-07-07 MED ORDER — MORPHINE SULFATE (PF) 0.5 MG/ML IJ SOLN
INTRAMUSCULAR | Status: DC | PRN
  Administered 2013-07-07: 4 mg via EPIDURAL

## 2013-07-07 MED ORDER — SIMETHICONE 80 MG PO CHEW: 80.0000 mg | CHEWABLE_TABLET | ORAL | Status: DC | PRN

## 2013-07-07 MED ORDER — OXYTOCIN 10 UNIT/ML IJ SOLN
INTRAMUSCULAR | Status: AC
  Filled 2013-07-07: qty 4

## 2013-07-07 MED ORDER — OXYTOCIN 10 UNIT/ML IJ SOLN
40.0000 [IU] | INTRAVENOUS | Status: DC | PRN
  Administered 2013-07-07: 40 [IU] via INTRAVENOUS

## 2013-07-07 MED ORDER — ZIDOVUDINE 10 MG/ML IV SOLN
2.0000 mg/kg | Freq: Once | INTRAVENOUS | Status: AC
  Administered 2013-07-07: 386 mg via INTRAVENOUS
  Filled 2013-07-07: qty 38.6

## 2013-07-07 MED ORDER — MENTHOL 3 MG MT LOZG: 1.0000 | LOZENGE | OROMUCOSAL | Status: DC | PRN

## 2013-07-07 MED ORDER — ONDANSETRON HCL 4 MG/2ML IJ SOLN
4.0000 mg | INTRAMUSCULAR | Status: DC | PRN
  Administered 2013-07-07: 4 mg via INTRAVENOUS
  Filled 2013-07-07: qty 2

## 2013-07-07 MED ORDER — ENOXAPARIN SODIUM 60 MG/0.6ML ~~LOC~~ SOLN
60.0000 mg | SUBCUTANEOUS | Status: DC
  Administered 2013-07-08: 18:00:00 60 mg via SUBCUTANEOUS
  Filled 2013-07-07: qty 0.6

## 2013-07-07 MED ORDER — ZOLPIDEM TARTRATE 5 MG PO TABS: 5.0000 mg | ORAL_TABLET | Freq: Every evening | ORAL | Status: DC | PRN

## 2013-07-07 MED ORDER — KETOROLAC TROMETHAMINE 60 MG/2ML IM SOLN
60.0000 mg | Freq: Once | INTRAMUSCULAR | Status: AC | PRN
  Administered 2013-07-07: 60 mg via INTRAMUSCULAR

## 2013-07-07 MED ORDER — IBUPROFEN 600 MG PO TABS
600.0000 mg | ORAL_TABLET | Freq: Four times a day (QID) | ORAL | Status: DC
  Administered 2013-07-08 – 2013-07-09 (×6): 600 mg via ORAL
  Filled 2013-07-07 (×6): qty 1

## 2013-07-07 MED ORDER — LACTATED RINGERS IV SOLN
INTRAVENOUS | Status: DC
  Administered 2013-07-07 (×3): via INTRAVENOUS

## 2013-07-07 MED ORDER — SCOPOLAMINE 1 MG/3DAYS TD PT72: 1.0000 | MEDICATED_PATCH | Freq: Once | TRANSDERMAL | Status: DC

## 2013-07-07 MED ORDER — MORPHINE SULFATE 0.5 MG/ML IJ SOLN
INTRAMUSCULAR | Status: AC
  Filled 2013-07-07: qty 10

## 2013-07-07 MED ORDER — SCOPOLAMINE 1 MG/3DAYS TD PT72
1.0000 | MEDICATED_PATCH | Freq: Once | TRANSDERMAL | Status: DC
  Administered 2013-07-07: 1.5 mg via TRANSDERMAL

## 2013-07-07 MED ORDER — METOCLOPRAMIDE HCL 5 MG/ML IJ SOLN: 10.0000 mg | Freq: Three times a day (TID) | INTRAMUSCULAR | Status: DC | PRN

## 2013-07-07 MED ORDER — SODIUM BICARBONATE 8.4 % IV SOLN
INTRAVENOUS | Status: AC
  Filled 2013-07-07: qty 50

## 2013-07-07 MED ORDER — OXYTOCIN 40 UNITS IN LACTATED RINGERS INFUSION - SIMPLE MED: 62.5000 mL/h | INTRAVENOUS | Status: AC

## 2013-07-07 MED ORDER — FENTANYL CITRATE 0.05 MG/ML IJ SOLN
INTRAMUSCULAR | Status: AC
  Filled 2013-07-07: qty 2

## 2013-07-07 MED ORDER — KETOROLAC TROMETHAMINE 30 MG/ML IJ SOLN
30.0000 mg | Freq: Four times a day (QID) | INTRAMUSCULAR | Status: AC | PRN
  Administered 2013-07-07: 30 mg via INTRAVENOUS
  Filled 2013-07-07: qty 1

## 2013-07-07 MED ORDER — ONDANSETRON HCL 4 MG/2ML IJ SOLN
INTRAMUSCULAR | Status: DC | PRN
  Administered 2013-07-07: 4 mg via INTRAVENOUS

## 2013-07-07 MED ORDER — LIDOCAINE-EPINEPHRINE (PF) 2 %-1:200000 IJ SOLN
INTRAMUSCULAR | Status: AC
  Filled 2013-07-07: qty 20

## 2013-07-07 MED ORDER — MEPERIDINE HCL 25 MG/ML IJ SOLN: 6.2500 mg | INTRAMUSCULAR | Status: DC | PRN

## 2013-07-07 MED ORDER — FENTANYL CITRATE 0.05 MG/ML IJ SOLN: 25.0000 ug | INTRAMUSCULAR | Status: DC | PRN

## 2013-07-07 MED ORDER — LACTATED RINGERS IV SOLN
INTRAVENOUS | Status: DC
  Administered 2013-07-07: 12:00:00 via INTRAVENOUS

## 2013-07-07 MED ORDER — FLEET ENEMA 7-19 GM/118ML RE ENEM: 1.0000 | ENEMA | Freq: Every day | RECTAL | Status: DC | PRN

## 2013-07-07 MED ORDER — NALOXONE HCL 0.4 MG/ML IJ SOLN: 0.4000 mg | INTRAMUSCULAR | Status: DC | PRN

## 2013-07-07 MED ORDER — ONDANSETRON HCL 4 MG/2ML IJ SOLN
INTRAMUSCULAR | Status: AC
  Filled 2013-07-07: qty 2

## 2013-07-07 MED ORDER — BISACODYL 10 MG RE SUPP: 10.0000 mg | Freq: Every day | RECTAL | Status: DC | PRN

## 2013-07-07 MED ORDER — SIMETHICONE 80 MG PO CHEW: 80.0000 mg | CHEWABLE_TABLET | ORAL | Status: DC

## 2013-07-07 MED ORDER — ONDANSETRON HCL 4 MG PO TABS: 4.0000 mg | ORAL_TABLET | ORAL | Status: DC | PRN

## 2013-07-07 MED ORDER — DIBUCAINE 1 % RE OINT: 1.0000 "application " | TOPICAL_OINTMENT | RECTAL | Status: DC | PRN

## 2013-07-07 MED ORDER — MORPHINE SULFATE (PF) 0.5 MG/ML IJ SOLN
INTRAMUSCULAR | Status: DC | PRN
  Administered 2013-07-07: 1 mg via INTRAVENOUS

## 2013-07-07 MED ORDER — CEPHALEXIN 500 MG PO CAPS
500.0000 mg | ORAL_CAPSULE | Freq: Four times a day (QID) | ORAL | Status: AC
  Administered 2013-07-07 – 2013-07-08 (×3): 500 mg via ORAL
  Filled 2013-07-07 (×4): qty 1

## 2013-07-07 MED ORDER — LACTATED RINGERS IV SOLN
INTRAVENOUS | Status: DC
  Administered 2013-07-08: 03:00:00 via INTRAVENOUS

## 2013-07-07 MED ORDER — SODIUM BICARBONATE 8.4 % IV SOLN
INTRAVENOUS | Status: DC | PRN
  Administered 2013-07-07 (×5): 5 mL via EPIDURAL

## 2013-07-07 MED ORDER — DIPHENHYDRAMINE HCL 50 MG/ML IJ SOLN: 25.0000 mg | INTRAMUSCULAR | Status: DC | PRN

## 2013-07-07 MED ORDER — SENNOSIDES-DOCUSATE SODIUM 8.6-50 MG PO TABS
2.0000 | ORAL_TABLET | Freq: Every day | ORAL | Status: DC
  Administered 2013-07-08: 2 via ORAL

## 2013-07-07 MED ORDER — ONDANSETRON HCL 4 MG/2ML IJ SOLN: 4.0000 mg | Freq: Three times a day (TID) | INTRAMUSCULAR | Status: DC | PRN

## 2013-07-07 MED ORDER — OXYCODONE-ACETAMINOPHEN 5-325 MG PO TABS
1.0000 | ORAL_TABLET | ORAL | Status: DC | PRN
  Administered 2013-07-08: 2 via ORAL
  Administered 2013-07-08: 1 via ORAL
  Administered 2013-07-08 – 2013-07-09 (×5): 2 via ORAL
  Filled 2013-07-07 (×6): qty 2
  Filled 2013-07-07: qty 1

## 2013-07-07 MED ORDER — LANOLIN HYDROUS EX OINT: 1.0000 "application " | TOPICAL_OINTMENT | CUTANEOUS | Status: DC | PRN

## 2013-07-07 MED ORDER — SODIUM CHLORIDE 0.9 % IJ SOLN: 3.0000 mL | INTRAMUSCULAR | Status: DC | PRN

## 2013-07-07 MED ORDER — KETOROLAC TROMETHAMINE 60 MG/2ML IM SOLN
INTRAMUSCULAR | Status: AC
  Filled 2013-07-07: qty 2

## 2013-07-07 MED ORDER — NALBUPHINE HCL 10 MG/ML IJ SOLN: 5.0000 mg | INTRAMUSCULAR | Status: DC | PRN

## 2013-07-07 MED ORDER — WITCH HAZEL-GLYCERIN EX PADS: 1.0000 "application " | MEDICATED_PAD | CUTANEOUS | Status: DC | PRN

## 2013-07-07 MED ORDER — SCOPOLAMINE 1 MG/3DAYS TD PT72
MEDICATED_PATCH | TRANSDERMAL | Status: AC
  Filled 2013-07-07: qty 1

## 2013-07-07 MED ORDER — DIPHENHYDRAMINE HCL 50 MG/ML IJ SOLN: 12.5000 mg | INTRAMUSCULAR | Status: DC | PRN

## 2013-07-07 MED ORDER — DIPHENHYDRAMINE HCL 25 MG PO CAPS: 25.0000 mg | ORAL_CAPSULE | Freq: Four times a day (QID) | ORAL | Status: DC | PRN

## 2013-07-07 MED ORDER — ALBUTEROL SULFATE (5 MG/ML) 0.5% IN NEBU: 2.5000 mg | INHALATION_SOLUTION | Freq: Once | RESPIRATORY_TRACT | Status: DC

## 2013-07-07 MED ORDER — ZIDOVUDINE 10 MG/ML IV SOLN
1.0000 mg/kg/h | INTRAVENOUS | Status: DC
  Administered 2013-07-07 (×2): 1 mg/kg/h via INTRAVENOUS
  Filled 2013-07-07 (×2): qty 40

## 2013-07-07 MED ORDER — DIPHENHYDRAMINE HCL 25 MG PO CAPS
25.0000 mg | ORAL_CAPSULE | ORAL | Status: DC | PRN
  Filled 2013-07-07: qty 1

## 2013-07-07 MED ORDER — DEXTROSE 5 % IV SOLN: 1.0000 ug/kg/h | INTRAVENOUS | Status: DC | PRN

## 2013-07-07 MED ORDER — LAMIVUDINE-ZIDOVUDINE 150-300 MG PO TABS
1.0000 | ORAL_TABLET | Freq: Two times a day (BID) | ORAL | Status: DC
  Administered 2013-07-07 – 2013-07-09 (×4): 1 via ORAL
  Filled 2013-07-07 (×4): qty 1

## 2013-07-07 MED ORDER — LOPINAVIR-RITONAVIR 200-50 MG PO TABS
2.0000 | ORAL_TABLET | Freq: Two times a day (BID) | ORAL | Status: DC
  Administered 2013-07-07 – 2013-07-09 (×4): 2 via ORAL
  Filled 2013-07-07 (×4): qty 2

## 2013-07-07 MED ORDER — ALBUTEROL SULFATE (5 MG/ML) 0.5% IN NEBU
INHALATION_SOLUTION | RESPIRATORY_TRACT | Status: AC
  Filled 2013-07-07: qty 0.5

## 2013-07-07 MED ORDER — FENTANYL CITRATE 0.05 MG/ML IJ SOLN
INTRAMUSCULAR | Status: DC | PRN
  Administered 2013-07-07: 100 ug via EPIDURAL

## 2013-07-07 SURGICAL SUPPLY — 37 items
CLAMP CORD UMBIL (MISCELLANEOUS) IMPLANT
CLEANER TIP ELECTROSURG 2X2 (MISCELLANEOUS) ×2 IMPLANT
CLOTH BEACON ORANGE TIMEOUT ST (SAFETY) ×2 IMPLANT
CONTAINER PREFILL 10% NBF 15ML (MISCELLANEOUS) IMPLANT
DEVICE BLD TRNS LUER ATTCH (MISCELLANEOUS) ×2 IMPLANT
DRAIN JACKSON PRT FLT 7MM (DRAIN) IMPLANT
DRAPE LG THREE QUARTER DISP (DRAPES) ×4 IMPLANT
DRSG OPSITE POSTOP 4X10 (GAUZE/BANDAGES/DRESSINGS) ×2 IMPLANT
DURAPREP 26ML APPLICATOR (WOUND CARE) ×2 IMPLANT
ELECT REM PT RETURN 9FT ADLT (ELECTROSURGICAL) ×2
ELECTRODE REM PT RTRN 9FT ADLT (ELECTROSURGICAL) ×1 IMPLANT
EVACUATOR SILICONE 100CC (DRAIN) IMPLANT
EXTRACTOR VACUUM M CUP 4 TUBE (SUCTIONS) IMPLANT
GLOVE BIO SURGEON STRL SZ7 (GLOVE) ×2 IMPLANT
GLOVE BIOGEL PI IND STRL 7.0 (GLOVE) ×1 IMPLANT
GLOVE BIOGEL PI INDICATOR 7.0 (GLOVE) ×1
GOWN PREVENTION PLUS XLARGE (GOWN DISPOSABLE) ×4 IMPLANT
GOWN STRL REIN XL XLG (GOWN DISPOSABLE) ×4 IMPLANT
HEMOSTAT SURGICEL 2X3 (HEMOSTASIS) ×1 IMPLANT
KIT ABG SYR 3ML LUER SLIP (SYRINGE) IMPLANT
NDL HYPO 25X5/8 SAFETYGLIDE (NEEDLE) ×1 IMPLANT
NEEDLE HYPO 25X5/8 SAFETYGLIDE (NEEDLE) ×2 IMPLANT
NS IRRIG 1000ML POUR BTL (IV SOLUTION) ×2 IMPLANT
PACK C SECTION WH (CUSTOM PROCEDURE TRAY) ×2 IMPLANT
PAD OB MATERNITY 4.3X12.25 (PERSONAL CARE ITEMS) ×2 IMPLANT
PENCIL BUTTON HOLSTER BLD 10FT (ELECTRODE) ×2 IMPLANT
RTRCTR C-SECT PINK 25CM LRG (MISCELLANEOUS) ×2 IMPLANT
STAPLER VISISTAT 35W (STAPLE) ×1 IMPLANT
SUT MON AB 2-0 CT1 27 (SUTURE) ×1 IMPLANT
SUT PDS AB 0 CTX 60 (SUTURE) ×1 IMPLANT
SUT PLAIN 2 0 XLH (SUTURE) ×1 IMPLANT
SUT VIC AB 0 CTX 36 (SUTURE) ×10
SUT VIC AB 0 CTX36XBRD ANBCTRL (SUTURE) ×5 IMPLANT
SUT VIC AB 4-0 KS 27 (SUTURE) IMPLANT
TOWEL OR 17X24 6PK STRL BLUE (TOWEL DISPOSABLE) ×2 IMPLANT
TRAY FOLEY CATH 14FR (SET/KITS/TRAYS/PACK) ×2 IMPLANT
WATER STERILE IRR 1000ML POUR (IV SOLUTION) ×2 IMPLANT

## 2013-07-07 NOTE — Transfer of Care (Signed)
Immediate Anesthesia Transfer of Care Note  Patient: Eileen [name]  Procedure(s) Performed: Procedure(s): REPEAT CESAREAN SECTION,  (N/A)  Patient Location: PACU  Anesthesia Type:Epidural  Level of Consciousness: awake  Airway & Oxygen Therapy: Patient Spontanous Breathing  Post-op Assessment: Report given to PACU RN  Post vital signs: Reviewed and stable  Complications: No apparent anesthesia complications

## 2013-07-07 NOTE — H&P (Addendum)
Eileen [name] is a 27 y.o. female presenting for repeat cesarean section at 39 weeks. History OB History   Grav Para Term Preterm Abortions TAB SAB Ect Mult Living   4 2 2  1  1   2      Past Medical History  Diagnosis Date  . HIV infection   . Thyroid disease   . Genital herpes   . Syphilis     was treated  . Obesity   . HIV (human immunodeficiency virus infection)   . Anesthesia complication-postpartum     stopped breathing after spinal with c/s, in ICU  . Complication of anesthesia   . PONV (postoperative nausea and vomiting)   . Hypertension     with pregnancy  . Gestational diabetes     resolved - Hx with 1st preg but not with 2nd or this preganacy   Past Surgical History  Procedure Laterality Date  . Cesarean section    . Wisdom tooth extraction     Family History: family history includes Diabetes in her maternal grandmother. There is no history of Anesthesia problems, Hypotension, Malignant hyperthermia, or Pseudochol deficiency. Social History:  reports that she has been smoking Cigarettes.  She has a 2.5 pack-year smoking history. She has never used smokeless tobacco. She reports that she does not drink alcohol or use illicit drugs.   Prenatal Transfer Tool  Maternal Diabetes: No Genetic Screening: Declined--too late Maternal Ultrasounds/Referrals: Normal but limited due to habitus Fetal Ultrasounds or other Referrals:  None Maternal Substance Abuse:  No Significant Maternal Medications:  Meds include: Other: HIV medications Significant Maternal Lab Results:  None Other Comments:  undetectable viral load  ROS    Blood pressure 155/81, pulse 93, temperature 98.2 F (36.8 C), temperature source Oral, resp. rate 20, weight 425 lb (192.779 kg), last menstrual period 10/16/2012, SpO2 96.00%, not currently breastfeeding. Exam Physical Exam  Vitals reviewed. Constitutional: She is oriented to person, place, and time. She appears well-developed and  well-nourished. No distress.  HENT:  Head: Normocephalic and atraumatic.  Eyes: Conjunctivae are normal.  Neck: Neck supple. No thyromegaly present.  Cardiovascular: Normal rate and regular rhythm.   Respiratory: She has wheezes.  GI: Soft. She exhibits no distension. There is no tenderness.  Genitourinary:  Pt reports a resolving vulvar abscess that was packed on 07/05/13.  Can not see in this bed.  Will exam in the OR and repack if necessary.  Musculoskeletal: She exhibits no tenderness.  Neurological: She is alert and oriented to person, place, and time.  Skin: Skin is warm and dry.  Psychiatric: She has a normal mood and affect.    Prenatal labs: ABO, Rh: --/--/O POS (09/15 1013) Antibody: NEG (09/15 1013) Rubella: 3.27 (06/05 1207) RPR: NON REACTIVE (09/15 1013)  HBsAg: NEGATIVE (06/05 1207)  HIV:   positive GBS:     Assessment/Plan: 27 yo G3P2002 at 39 weeks for repeat cesarean section 1-HIV viral load is undetectable.  Following AZT protocol 2-Pt has wheezes bilaterally.  Will give nebulizer treatment and reassess. 3-Examine vulvar area once in the OR 4-Keflex for wound prophylaxis 5-Wound vac prophylactic 6-SCDs  7- Check BPs.  If consistently elevated will draw preeclampsia labs.  Eileen Berhane H. 07/07/2013, 10:52 AM

## 2013-07-07 NOTE — Anesthesia Postprocedure Evaluation (Signed)
  Anesthesia Post Note  Patient: Eileen [name]  Procedure(s) Performed: Procedure(s) (LRB): REPEAT CESAREAN SECTION,  (N/A)  Anesthesia type: Epidural  Patient location: PACU  Post pain: Pain level controlled  Post assessment: Post-op Vital signs reviewed  Last Vitals:  Filed Vitals:   07/07/13 1226  BP: 148/68  Pulse:   Temp:   Resp:     Post vital signs: Reviewed  Level of consciousness: awake  Complications: No apparent anesthesia complications

## 2013-07-07 NOTE — Lactation Note (Signed)
This note was copied from the chart of Eileen [name]. Lactation Consultation Note  Patient Name: Eileen [name] [id number] Date: 07/07/2013 Reason for consult: Initial assessment;Other (Comment) (charting for exclusion)   Maternal Data Formula Feeding for Exclusion: Yes Reason for exclusion: Mother's choice to forumla feed on admision  Feeding    LATCH Score/Interventions                      Lactation Tools Discussed/Used     Consult Status Consult Status: Complete    Lynda Rainwater 07/07/2013, 3:43 PM

## 2013-07-07 NOTE — Consult Note (Signed)
Neonatology Note:   Attendance at C-section:    I was asked by Dr. Rollen Sox to attend this repeat C/S at term. The mother is a G4P2A1 O pos, GBS not found with morbid obesity, hypothyroidism, HIV positive (with undetectable viral load), HSV, and chronic HTN. She received late [facility]. She is on Zidovudine. ROM at delivery, fluid clear. Baby delivered breech and it was difficult getting the head delivered. Infant had decreased tone and was blue initially, but after bulb suctioning and stimulation, she cried vigorously and regained normal tone very quickly. Color was pink by 3-4 min. Ap 8/9. Lungs clear to ausc in DR. To CN to care of Pediatrician.   Doretha Sou, MD

## 2013-07-07 NOTE — Telephone Encounter (Signed)
I was notified that Eileen [name] delivered a baby girl by C section today at [facility]. She has been out of care in our clinic since last November but restarted Combivir and Kaletra when she became pregnant ad her recent viral load was <20. I will ask our [facility] counselor to contact her to attempt to get her back into care.

## 2013-07-07 NOTE — Anesthesia Preprocedure Evaluation (Addendum)
Anesthesia Evaluation  Patient identified by MRN, date of birth, ID band Patient awake    Reviewed: Allergy & Precautions, H&P , NPO status , Patient's Chart, lab work & pertinent test results, reviewed documented beta blocker date and time   History of Anesthesia Complications (+) PONV  Airway Mallampati: II TM Distance: >3 FB Neck ROM: full    Dental  (+) Teeth Intact   Pulmonary sleep apnea (thinks she may have OSA, no sleep study, partner says she does not snore often) , Current Smoker,  breath sounds clear to auscultation        Cardiovascular hypertension, On Home Beta Blockers Rhythm:regular Rate:Normal     Neuro/Psych negative neurological ROS  negative psych ROS   GI/Hepatic negative GI ROS, Neg liver ROS,   Endo/Other  neg diabetesMorbid obesity (BMI 62)  Renal/GU negative Renal ROS  negative genitourinary   Musculoskeletal   Abdominal   Peds  Hematology  (+) HIV,   Anesthesia Other Findings First C/S (2007) labor epidural was used for C/S.  Had respiratory distress and desat event after surgery and went to ICU for O2 by mask and monitoring (pt thinks was related to epidural meds?).  Last C/S (2011) had wet tap with tuohy during spinal placement in OR, SAB dose was given, placed intrathecal catheter which was removed at end of case.  Did not have PDPH.  Reproductive/Obstetrics (+) Pregnancy (h/o c/s x2)                       Anesthesia Physical Anesthesia Plan  ASA: III  Anesthesia Plan: Epidural   Post-op Pain Management:    Induction:   Airway Management Planned:   Additional Equipment:   Intra-op Plan:   Post-operative Plan:   Informed Consent: I have reviewed the patients History and Physical, chart, labs and discussed the procedure including the risks, benefits and alternatives for the proposed anesthesia with the patient or authorized representative who has indicated  his/her understanding and acceptance.     Plan Discussed with: CRNA and Surgeon  Anesthesia Plan Comments:       Anesthesia Quick Evaluation

## 2013-07-07 NOTE — Anesthesia Procedure Notes (Signed)
Epidural Patient location during procedure: OB Start time: 07/07/2013 1:52 PM  Staffing Anesthesiologist: Angus Seller., Harrell Gave. Performed by: anesthesiologist   Preanesthetic Checklist Completed: patient identified, site marked, surgical consent, pre-op evaluation, timeout performed, IV checked, risks and benefits discussed and monitors and equipment checked  Epidural Patient position: sitting Prep: site prepped and draped and DuraPrep Patient monitoring: continuous pulse ox and blood pressure Approach: midline Injection technique: LOR air and LOR saline  Needle:  Needle type: Tuohy  Needle gauge: 17 G Needle length: 9 cm and 9 Needle insertion depth: 9.5 cm Catheter type: closed end flexible Catheter size: 19 Gauge Catheter at skin depth: 13 cm Test dose: negative  Assessment Events: blood not aspirated, injection not painful, no injection resistance, negative IV test and no paresthesia  Additional Notes Patient identified.  Risk benefits discussed including failed block, incomplete pain control, headache, nerve damage, paralysis, blood pressure changes, nausea, vomiting, reactions to medication both toxic or allergic, and postpartum back pain.  Patient expressed understanding and wished to proceed.  All questions were answered.  Sterile technique used throughout procedure and epidural site dressed with sterile barrier dressing. No paresthesia or other complications noted.The patient did not experience any signs of intravascular injection such as tinnitus or metallic taste in mouth nor signs of intrathecal spread such as rapid motor block. Please see nursing notes for vital signs.

## 2013-07-07 NOTE — Op Note (Signed)
Eileen [name] PROCEDURE DATE: 07/07/2013  PREOPERATIVE DIAGNOSES: Intrauterine pregnancy at  [redacted]w[redacted]d weeks gestation; LTCS, HIV + (undetectable viral load)  POSTOPERATIVE DIAGNOSES: The same  PROCEDURE: Repeat Low Transverse Cesarean Section  SURGEON:  Drs. Odom, Legget and Debroah Loop  ASSISTANT:  Dr. Debroah Loop  INDICATIONS: Eileen [name] is a 27 y.o. [phone] at [redacted]w[redacted]d here for cesarean section secondary to the indications listed under preoperative diagnoses; please see preoperative note for further details.  The risks of cesarean section were discussed with the patient including but were not limited to: bleeding which may require transfusion or reoperation; infection which may require antibiotics; injury to bowel, bladder, ureters or other surrounding organs; injury to the fetus; need for additional procedures including hysterectomy in the event of a life-threatening hemorrhage; placental abnormalities wth subsequent pregnancies, incisional problems, thromboembolic phenomenon and other postoperative/anesthesia complications.   The patient concurred with the proposed plan, giving informed written consent for the procedure.    FINDINGS:  Viable female infant in Complete breech presentation.  Apgars 8 and 9.  Clear amniotic fluid.  Intact placenta, three vessel cord.  Normal uterus, fallopian tubes and ovaries bilaterally.  ANESTHESIA: Epidural INTRAVENOUS FLUIDS: 2600 ml ESTIMATED BLOOD LOSS: 800 ml URINE OUTPUT:  800 ml SPECIMENS: Placenta sent to pathology COMPLICATIONS: None immediate  PROCEDURE IN DETAIL:  The patient preoperatively received intravenous antibiotics 3g Ancef and had sequential compression devices applied to her lower extremities.  She was then taken to the operating room where an epidural was dosed up to surgical level and was found to be adequate. She was then placed in a dorsal supine position with a leftward tilt, panus taped cranial and prepped and draped in a sterile  manner.  A foley catheter was placed into her bladder and attached to constant gravity.  After an adequate timeout was performed, a Pfannenstiel skin incision over old scar was made with scalpel and carried through to the underlying layer of fascia. The fascia was incised in the midline, and this incision was extended bilaterally using the Mayo scissors.  Kocher clamps were applied to the superior aspect of the fascial incision and the underlying rectus muscles were dissected off with mayos. A similar process was carried out on the inferior aspect of the fascial incision. The rectus muscles were separated in the left lateral bluntly and the peritoneum was entered sharply. Alexis O was placed. Omentum above uterus. Piece of omentum over uterus was tied and cut. Attention was turned to the lower uterine segment where a low transverse hysterotomy was made with a scalpel and extended bilaterally bluntly.  The infant was successfully delivered, the cord was clamped and cut and the infant was handed over to awaiting neonatology team. Uterine massage was then administered, and the placenta delivered intact with a three-vessel cord. The uterus was then cleared of clot and debris.  The hysterotomy was closed with 0 Vicryl in a running locked fashion. Several figure of eights and running locking stitches required for hemostasis. A surgicel was placed to aid in hemostasis. The pelvis was cleared of all clot and debris. Hemostasis was confirmed on all surfaces.  The muscles were reapproximated using 0 Vicryl figure of 8 stitches. The fascia was then closed using Loop PDS in a running fashion.  The subcutaneous layer was irrigated, then reapproximated with 2-0 plain gut interrupted stitches.  The skin was closed with staples and covered with a vacuum prophylacticl.. The patient tolerated the procedure well. Sponge, lap, instrument and needle counts were correct  x 2.  She was taken to the recovery room in stable condition.  Prevena prophylactic wound vac was placed.  Eileen Scale, MD OB Fellow  I was present for the entire procedure.

## 2013-07-08 ENCOUNTER — Encounter (HOSPITAL_COMMUNITY): Payer: Self-pay | Admitting: Obstetrics & Gynecology

## 2013-07-08 LAB — CBC
Hemoglobin: 9.9 g/dL — ABNORMAL LOW (ref 12.0–15.0)
MCHC: 33.3 g/dL (ref 30.0–36.0)
WBC: 5.8 10*3/uL (ref 4.0–10.5)

## 2013-07-08 MED ORDER — PNEUMOCOCCAL VAC POLYVALENT 25 MCG/0.5ML IJ INJ
0.5000 mL | INJECTION | INTRAMUSCULAR | Status: AC
  Administered 2013-07-09: 0.5 mL via INTRAMUSCULAR
  Filled 2013-07-08: qty 0.5

## 2013-07-08 NOTE — Progress Notes (Signed)
UR chart review completed.  

## 2013-07-08 NOTE — Telephone Encounter (Signed)
Will notify [employer] of patient infant delivery and need to return to [facility] care.

## 2013-07-08 NOTE — Anesthesia Postprocedure Evaluation (Signed)
Anesthesia Post Note  Patient: Eileen [name]  Procedure(s) Performed: Procedure(s) (LRB): REPEAT CESAREAN SECTION,  (N/A)  Anesthesia type: Epidural  Patient location: Mother/Baby  Post pain: Pain level controlled  Post assessment: Post-op Vital signs reviewed  Last Vitals:  Filed Vitals:   07/08/13 0521  BP: 112/68  Pulse: 89  Temp: 36.7 C  Resp: 16    Post vital signs: Reviewed  Level of consciousness:alert  Complications: No apparent anesthesia complications

## 2013-07-08 NOTE — Progress Notes (Addendum)
Subjective: Postpartum Day 1: Cesarean Delivery Patient reports tolerating PO and + flatus.   Foley removed and not voiding yet Pt states mild incisional pain but otherwise pain well controlled.  Objective: Vital signs in last 24 hours: Temp:  [96.3 F (35.7 C)-98.8 F (37.1 C)] 98.1 F (36.7 C) (09/17 0521) Pulse Rate:  [77-105] 89 (09/17 0521) Resp:  [15-20] 16 (09/17 0521) BP: (94-155)/(58-87) 112/68 mmHg (09/17 0521) SpO2:  [92 %-98 %] 98 % (09/17 0300)  Physical Exam:  General: alert, cooperative, appears stated age and no distress Lochia: appropriate Uterine Fundus: mid panus below umbilicus Firm Incision: woound vac in place no signficant drainage DVT Evaluation: No evidence of DVT seen on physical exam. Bilateral lungs tight with wheezes. Satting well on RA Negative Homan's sign. No cords or calf tenderness. No significant calf/ankle edema. Labial absess without drainage. NTTP    Recent Labs  07/07/13 1612 07/08/13 0550  HGB 11.0* 9.9*  HCT 32.3* 29.7*    Assessment/Plan: Status post Cesarean section. Doing well postoperatively.  Continue current care. Continue retrovirals, ID aware and attempting to schedule follow up. Labial abscess stable. Rec'd 23.5hr keflex, no longer required. Continue to monitor Continue albuterol PRN   Expected discharge POD 2-3  Nyeem Stoke RYAN 07/08/2013, 7:34 AM

## 2013-07-09 ENCOUNTER — Encounter: Payer: Self-pay | Admitting: Obstetrics & Gynecology

## 2013-07-09 LAB — WOUND CULTURE

## 2013-07-09 MED ORDER — IBUPROFEN 600 MG PO TABS: 600.0000 mg | ORAL_TABLET | Freq: Four times a day (QID) | ORAL | Status: DC

## 2013-07-09 MED ORDER — OXYCODONE-ACETAMINOPHEN 5-325 MG PO TABS: 1.0000 | ORAL_TABLET | ORAL | Status: DC | PRN

## 2013-07-09 NOTE — Discharge Summary (Signed)
Obstetric Discharge Summary Reason for Admission: cesarean section- repeat  Prenatal Procedures: none Intrapartum Procedures: cesarean: low cervical, transverse Postpartum Procedures: antibiotics for labial wound Complications-Operative and Postpartum: none Hemoglobin  Date Value Range Status  07/08/2013 9.9* 12.0 - 15.0 g/dL Final     HCT  Date Value Range Status  07/08/2013 29.7* 36.0 - 46.0 % Final   Eileen [name], is a 27yo [id number] at 39.0 who was admitted for a repeat LTCS.  Her prenatal care has been followed by the [facility] and has been remarkable for late onset of care at 24wks; prev C/S x 3; HIV+ with undetectable viral load; morbid obesity. She tolerated the procedure well and her postop stay has been unremarkable. She is deemed to have received the full benefit of her hospital stay and will be discharged home today, POD#2. ID has been notified of pt's delivered status and she will be contacted by the Bridge program to continue her care for HIV. Inbox msg to [facility] for appt next week to remove wound vac.  Physical Exam:  General: alert, cooperative and mild distress Heart: RRR Lungs: nl effort Lochia: appropriate Uterine Fundus: firm Incision: healing well, no significant drainage, wound vac present DVT Evaluation: No evidence of DVT seen on physical exam.  Discharge Diagnoses: Term Pregnancy-delivered; HIV+  Discharge Information: Date: 07/09/2013 Activity: pelvic rest Diet: routine Medications: PNV, Ibuprofen, Percocet and Combivir, Kaletra Condition: stable Instructions: refer to practice specific booklet Discharge to: home Follow-up Information   Follow up with [facility]. (You will be called for an appointment for early next week to take your wound vacuum off.)    Contact information:   [address] [place] [address] [phone]      Newborn Data: Live born female  Birth Weight: 7 lb 9.3 oz (3440 g) APGAR: 8, 9  Home with mother.  Bottlefeeding; desires LARC.  Eileen [name] 07/09/2013, 8:42 AM

## 2013-07-09 NOTE — Discharge Summary (Signed)
Attestation of Attending Supervision of Advanced Practitioner: Evaluation and management procedures were performed by the PA/NP/CNM/OB Fellow under my supervision/collaboration. Chart reviewed and agree with management and plan.  Mariska Daffin V 07/09/2013 11:32 PM

## 2013-07-09 NOTE — Clinical Social Work Maternal (Signed)
    LATE ENTRY FROM 07/08/13:  Clinical Social Work Department PSYCHOSOCIAL ASSESSMENT - MATERNAL/CHILD 07/09/2013  Patient:  Eileen [name], Eileen [name]  Account Number:  [id number]  Admit Date:  07/07/2013  Marjo Bicker Name:   Eileen [name]    Clinical Social Worker:  Nobie Putnam, LCSW   Date/Time:  07/09/2013 02:58 PM  Date Referred:  07/08/2013   Referral source  CN     Referred reason  [id number]   Other referral source:    I:  FAMILY / HOME ENVIRONMENT Child's legal guardian:  PARENT  Guardian - Name Guardian - Age Guardian - Address  Eileen [name] [address]; [place], [place] [address]  Eileen [name] 28 (same as above)   Other household support members/support persons Name Relationship DOB  Eileen [name] SON 39 years old  Eileen [name] 76 years old   Other support:   Eileen [name], Eileen [name]    II  PSYCHOSOCIAL DATA Information Source:  Patient Interview  Event organiser Employment:   Financial resources:  [employer] If Medicaid - County:  [place] Other  Sales executive  WIC  Section 8   School / Grade:   Maternity Care Coordinator / Child Services Coordination / Early Interventions:  Cultural issues impacting care:    III  STRENGTHS Strengths  Adequate Resources  Home prepared for Child (including basic supplies)  Supportive family/friends   Strength comment:    IV  RISK FACTORS AND CURRENT PROBLEMS Current Problem:  YES   Risk Factor & Current Problem Patient Issue Family Issue Risk Factor / Current Problem Comment  Other - See comment Y N 042   N N     V  SOCIAL WORK ASSESSMENT CSW met with pt to assess her current situation & schedule follow up appointment for infant.  Pt was diagnosed in 2007 & states she has coped well will illness.  She denies any depression of history of SI.  Pt would like to follow up at [facility] clinic, since she is familiar with the clinic. CSW gathered the information to fax to [facility] & follow up with  appointment dates/time.  Pt has all the necessary supplies for the infant & good family support.  Her [name] was present during assessment & aware of diagnoses.  CSW will continue to follow & assist as needed.      VI SOCIAL WORK PLAN Social Work Plan  No Further Intervention Required / No Barriers to Discharge   Type of pt/family education:   If child protective services report - county:   If child protective services report - date:   Information/referral to community resources comment:   Other social work plan:

## 2013-07-10 LAB — TYPE AND SCREEN
Antibody Screen: NEGATIVE
Unit division: 0

## 2013-07-13 ENCOUNTER — Ambulatory Visit (INDEPENDENT_AMBULATORY_CARE_PROVIDER_SITE_OTHER): Payer: Medicaid Other | Admitting: Obstetrics & Gynecology

## 2013-07-13 VITALS — BP 173/99 | HR 87 | Temp 98.5°F | Ht 69.5 in | Wt >= 6400 oz

## 2013-07-13 DIAGNOSIS — Z09 Encounter for follow-up examination after completed treatment for conditions other than malignant neoplasm: Secondary | ICD-10-CM

## 2013-07-13 DIAGNOSIS — B20 Human immunodeficiency virus [HIV] disease: Secondary | ICD-10-CM

## 2013-07-13 NOTE — Progress Notes (Signed)
Pt reports not taking labetalol yet this am

## 2013-07-13 NOTE — Progress Notes (Signed)
  Subjective:    Patient ID: Eileen [name], female    DOB: [birth year], 27 y.o.   MRN: [id number]  HPI s/p cesarean section 9/18 [place] vacuum an d staples    Review of Systems     Objective:   Physical Exam Filed Vitals:   07/13/13 0932  BP: 173/99  Pulse: 87  Temp:      Wound intact, device and staples removed     Assessment & Plan:  POD 4 RTC 1 mo Labetalol  Adam Phenix, MD 07/13/2013

## 2013-07-22 ENCOUNTER — Ambulatory Visit (INDEPENDENT_AMBULATORY_CARE_PROVIDER_SITE_OTHER): Payer: Medicaid Other | Admitting: Infectious Diseases

## 2013-07-22 ENCOUNTER — Encounter: Payer: Self-pay | Admitting: Infectious Diseases

## 2013-07-22 VITALS — BP 147/96 | HR 79 | Temp 97.9°F | Wt 395.0 lb

## 2013-07-22 DIAGNOSIS — B20 Human immunodeficiency virus [HIV] disease: Secondary | ICD-10-CM

## 2013-07-22 DIAGNOSIS — E669 Obesity, unspecified: Secondary | ICD-10-CM | POA: Insufficient documentation

## 2013-07-22 NOTE — Addendum Note (Signed)
Addended by: Wendall Mola A on: 07/22/2013 04:58 PM   Modules accepted: Orders

## 2013-07-22 NOTE — Progress Notes (Signed)
  Subjective:    Patient ID: Eileen [name], female    DOB: 02-Jul-1986, 27 y.o.   MRN: [id number]  HPI 27 yo F with morbid obesity and HIV+. She was dx 1-07 when she was pregnant. She delivered a HIV- infant. Was treated with combivir and kaletra. She was again pregnant 2011 and had c-section 08-03-10.  She was seen in f/u July 2012 and wanted to start QDay regimen. Was put on complera. Had c-section on 07-07-13.  Has been doing well since. Has taken her baby to [facility] for f/u. Has gone back to work. Is worried that her CD4 is 7. Upon review her CD4 is 29%, this is explained to pt. She is not breast feeding.   HIV 1 RNA Quant (copies/mL)  Date Value  06/29/2013 <20   04/27/2013 46*  09/03/2012 3016*     CD4 T Cell Abs (cmm)  Date Value  04/27/2013 530   07/10/2012 530   02/27/2012 350*    Review of Systems  Constitutional: Negative for appetite change.  Gastrointestinal: Negative for diarrhea and constipation.  Genitourinary: Negative for dysuria.       Objective:   Physical Exam  Constitutional: She appears well-developed and well-nourished.  HENT:  Mouth/Throat: No oropharyngeal exudate.  Eyes: EOM are normal. Pupils are equal, round, and reactive to light.  Neck: Neck supple.  Cardiovascular: Normal rate, regular rhythm and normal heart sounds.   Pulmonary/Chest: Effort normal and breath sounds normal.  Abdominal: Soft. Bowel sounds are normal. She exhibits distension. There is no tenderness.  Lymphadenopathy:    She has no cervical adenopathy.          Assessment & Plan:

## 2013-07-22 NOTE — Assessment & Plan Note (Signed)
She appears to be doing well. Will see her back in 4-5 months. Recheck her labs then. She got a PNVx in hospital (had one 20 months previous, would be indicated if she were given PNV 13). She states she will use depo for birth control, will need OCP until this is done. I asked her to call her OB about this, I will be glad to refill. I would like their help so as not to give her OCP which causes weight gain if possible. She is given condoms.

## 2013-08-13 ENCOUNTER — Ambulatory Visit: Payer: Medicaid Other | Admitting: Obstetrics & Gynecology

## 2013-08-27 ENCOUNTER — Other Ambulatory Visit: Payer: Self-pay

## 2013-12-07 ENCOUNTER — Other Ambulatory Visit: Payer: Medicaid Other

## 2013-12-21 ENCOUNTER — Ambulatory Visit: Payer: Medicaid Other | Admitting: Infectious Diseases

## 2014-08-23 ENCOUNTER — Encounter: Payer: Self-pay | Admitting: Infectious Diseases

## 2015-03-16 ENCOUNTER — Inpatient Hospital Stay (HOSPITAL_COMMUNITY)
Admission: AD | Admit: 2015-03-16 | Discharge: 2015-03-17 | Disposition: A | Discharge status: Home / Self Care | Payer: Self-pay | Source: Ambulatory Visit | Attending: Obstetrics & Gynecology | Admitting: Obstetrics & Gynecology

## 2015-03-16 DIAGNOSIS — Z3201 Encounter for pregnancy test, result positive: Secondary | ICD-10-CM | POA: Insufficient documentation

## 2015-03-16 DIAGNOSIS — E669 Obesity, unspecified: Secondary | ICD-10-CM | POA: Insufficient documentation

## 2015-03-16 DIAGNOSIS — Z21 Asymptomatic human immunodeficiency virus [HIV] infection status: Secondary | ICD-10-CM | POA: Insufficient documentation

## 2015-03-16 DIAGNOSIS — Z79899 Other long term (current) drug therapy: Secondary | ICD-10-CM | POA: Insufficient documentation

## 2015-03-16 DIAGNOSIS — Z882 Allergy status to sulfonamides status: Secondary | ICD-10-CM | POA: Insufficient documentation

## 2015-03-16 DIAGNOSIS — E079 Disorder of thyroid, unspecified: Secondary | ICD-10-CM | POA: Insufficient documentation

## 2015-03-16 DIAGNOSIS — F17210 Nicotine dependence, cigarettes, uncomplicated: Secondary | ICD-10-CM | POA: Insufficient documentation

## 2015-03-16 DIAGNOSIS — Z8632 Personal history of gestational diabetes: Secondary | ICD-10-CM | POA: Insufficient documentation

## 2015-03-16 DIAGNOSIS — N912 Amenorrhea, unspecified: Secondary | ICD-10-CM | POA: Insufficient documentation

## 2015-03-16 LAB — URINALYSIS, ROUTINE W REFLEX MICROSCOPIC
BILIRUBIN URINE: NEGATIVE
GLUCOSE, UA: NEGATIVE mg/dL
Hgb urine dipstick: NEGATIVE
KETONES UR: NEGATIVE mg/dL
Leukocytes, UA: NEGATIVE
NITRITE: NEGATIVE
Protein, ur: NEGATIVE mg/dL
Specific Gravity, Urine: >1.03 — ABNORMAL HIGH (ref 1.005–1.030)
Urobilinogen, UA: 0.2 mg/dL (ref 0.0–1.0)
pH: 5.5 (ref 5.0–8.0)

## 2015-03-16 LAB — POCT PREGNANCY, URINE: Preg Test, Ur: POSITIVE — AB

## 2015-03-16 NOTE — MAU Note (Signed)
Pt states she has had a positive preg test , LMP 12/21/2014. States she thinks she feels fetal movement. Also reports tooth pain.

## 2015-03-17 DIAGNOSIS — N912 Amenorrhea, unspecified: Secondary | ICD-10-CM

## 2015-03-17 DIAGNOSIS — Z3201 Encounter for pregnancy test, result positive: Secondary | ICD-10-CM

## 2015-03-17 NOTE — MAU Provider Note (Signed)
History     CSN: [id number]  Arrival date and time: 03/16/15 2301   None     No chief complaint on file.  HPI Comments: Eileen [name] is a 29 y.o. [phone] at Unknown who presents today for a pregnancy test. She states that she is unsure of her LMP. She had a normal period in December, and then she has had some spotting each month since. However, she thinks she has felt some fetal movement. She has not had any spotting since the beginning of April. She denies any abdominal pain today.      Past Medical History  Diagnosis Date  . HIV infection   . Thyroid disease   . Genital herpes   . Syphilis     was treated  . Obesity   . HIV (human immunodeficiency virus infection)   . Anesthesia complication-postpartum     stopped breathing after spinal with c/s, in ICU  . Complication of anesthesia   . PONV (postoperative nausea and vomiting)   . Hypertension     with pregnancy  . Gestational diabetes     resolved - Hx with 1st preg but not with 2nd or this preganacy    Past Surgical History  Procedure Laterality Date  . Cesarean section    . Wisdom tooth extraction    . Cesarean section N/A 07/07/2013    Procedure: REPEAT CESAREAN SECTION, ;  Surgeon: Lesly DukesKelly H Leggett, MD;  Location: [facility];  Service: Obstetrics;  Laterality: N/A;    Family History  Problem Relation Age of Onset  . Anesthesia problems Neg Hx   . Hypotension Neg Hx   . Malignant hyperthermia Neg Hx   . Pseudochol deficiency Neg Hx   . Diabetes Maternal Grandmother     History  Substance Use Topics  . Smoking status: Current Every Day Smoker -- 0.25 packs/day for 10 years    Types: Cigarettes  . Smokeless tobacco: Never Used  . Alcohol Use: No    Allergies:  Allergies  Allergen Reactions  . Sulfonamide Derivatives Anaphylaxis  . Other     Steroids, unknown reaction    Prescriptions prior to admission  Medication Sig Dispense Refill Last Dose  . ibuprofen (ADVIL,MOTRIN) 600 MG tablet Take 1  tablet (600 mg total) by mouth every 6 (six) hours. 50 tablet 1 Taking  . KALETRA 200-50 MG per tablet TAKE 2 TABLETS BY MOUTH TWICE A DAY 120 tablet 0 Taking  . labetalol (NORMODYNE) 100 MG tablet TAKE 2 TABLETS BY MOUTH TWICE A DAY 60 tablet 1 Taking  . lamiVUDine-zidovudine (COMBIVIR) 150-300 MG per tablet TAKE 1 TABLET BY MOUTH TWICE A DAY 60 tablet 0 Taking  . oxyCODONE-acetaminophen (PERCOCET/ROXICET) 5-325 MG per tablet Take 1-2 tablets by mouth every 4 (four) hours as needed. 50 tablet 0 Taking  . Prenatal Vit-Fe Fumarate-FA (PRENATAL MULTIVITAMIN) TABS tablet Take 1 tablet by mouth daily at 12 noon.   Taking    ROS Physical Exam   Blood pressure 154/55, pulse 81, temperature 99 F (37.2 C), temperature source Oral, resp. rate 20, last menstrual period 12/21/2014, SpO2 97 %.  Physical Exam  Nursing note and vitals reviewed. Constitutional: She is oriented to person, place, and time. She appears well-developed and well-nourished. No distress.  Cardiovascular: Normal rate.   Respiratory: Effort normal.  GI: Soft. There is no tenderness.  Genitourinary:  Uterine fundus difficult to assess 2/2 body habitus. FHT with doppler.  Neurological: She is alert and oriented to person, place,  and time.  Skin: Skin is warm and dry.  Psychiatric: She has a normal mood and affect.    Results for orders placed or performed during the hospital encounter of 03/16/15 (from the past 24 hour(s))  Urinalysis, Routine w reflex microscopic     Status: Abnormal   Collection Time: 03/16/15 11:34 PM  Result Value Ref Range   Color, Urine YELLOW YELLOW   APPearance CLEAR CLEAR   Specific Gravity, Urine >1.030 (H) 1.005 - 1.030   pH 5.5 5.0 - 8.0   Glucose, UA NEGATIVE NEGATIVE mg/dL   Hgb urine dipstick NEGATIVE NEGATIVE   Bilirubin Urine NEGATIVE NEGATIVE   Ketones, ur NEGATIVE NEGATIVE mg/dL   Protein, ur NEGATIVE NEGATIVE mg/dL   Urobilinogen, UA 0.2 0.0 - 1.0 mg/dL   Nitrite NEGATIVE NEGATIVE    Leukocytes, UA NEGATIVE NEGATIVE  Pregnancy, urine POC     Status: Abnormal   Collection Time: 03/16/15 11:55 PM  Result Value Ref Range   Preg Test, Ur POSITIVE (A) NEGATIVE    MAU Course  Procedures  MDM   Assessment and Plan   1. Pregnancy confirmed by positive urine test   2. Amenorrhea    DC home Outpatient [place]   Tawnya Crook 03/17/2015, 12:08 AM

## 2015-03-17 NOTE — Discharge Instructions (Signed)
Pregnancy and AIDS AIDS (Acquired Immunodeficiency Syndrome) is the advanced stage of HIV infection. This infection is caused by the human immunodeficiency virus (HIV). It is a life threatening disease that prevents the body from making antibodies that fight infections and it increases the chance of getting cancer. It is important to know if the virus is present in a pregnant woman because this virus may cross the placenta at the time of delivery. It can infect the fetus if not treated with antiviral, HIV or medications. The pregnant mother should begin treatment as soon as the diagnosis is made and continue throughout the pregnancy, labor and delivery. Then, the newborn baby should begin treatment right after delivery.  CAUSES The cause is the HIV virus, but there are risk factors and ways it is spread to others. These ways include:   Blood.  Semen including artificial insemination.  Vaginal secretions.  Breast milk.  Sexual contact including oral sex.  Sharing needles with infected people.  Blood transfusions (very rare in the [place]).  Organ transplants.  Accidental needle sticks or cuts from an infected person. SYMPTOMS OF HIV:  Fever.  Headache.  Sore throat.  Severe tiredness.  Weight loss.  Yeast or fungus infections of the mouth or vagina.  Repeated herpes infection of the mouth or genital area. SYMPTOMS AND OPPORTUNISTIC INFECTIONS WITH AIDS:  Severe cough.  Convulsions.  Shortness of breath.  Loss of vision.  Pneumonia.  Severe nausea, vomiting, and diarrhea.  Purple skin blotches.  Severe headaches.  Confusion. DIAGNOSIS  The diagnosis is made by suspicion and a positive ELISA assay blood test. This is confirmed by a Western blot test (types of lab tests). If you have not been tested for HIV, it is a good idea to do so. Especially if you are planning to get pregnant, have been exposed to or taken part in high risk behavior. This would include use of  intravenous drugs, exposure to multiple sex partners, having had a sexually transmitted disease or having a relationship with a person involved in high risk behavior. Approximately 25-35% of mothers who carry the human immunodeficiency virus will pass this infection on to their baby if not treated. TREATMENT It is advised that women should be tested for HIV before getting pregnant. The following is also recommended:  All pregnant women should know that they are being tested for HIV when the routine blood tests are taken on their first prenatal visit. If a woman refuses to be tested for HIV, it should be documented in her medical record and be signed by her and the caregiver.  The test should be repeated in the third trimester in women who are at high risk for HIV.  The HIV viral counts in the blood should be repeated every three months and if the antiviral medications change.  If a pregnant woman is tested positive for HIV, she should take antiviral HIV medications during the pregnancy, labor and delivery (vaginal delivery or Cesarean Section). The baby needs to be treated right away because it can lower the risk of the baby being infected with HIV infection from 25% to 2%.  Pregnant women with high viral counts should have a Cesarean delivery at 38 to 39 weeks of the pregnancy before labor begins or the membranes rupture. EFFECTS ON PREGNANCY HIV does not significantly affect the pregnancy. The birth weights, gestational age at delivery, and abortion rates are about the same as a pregnancy which has not been infected. However, if the pregnant woman is  not treated during labor and delivery and the baby is not treated right after delivery, the baby can get HIV. The baby can get what is called opportunistic infections. These types of infections can include pneumonia, intestinal problems and convulsions. Also, if HIV progresses to AIDS in a pregnant woman and more serious problems develop, the baby can be  affected. If AIDS with its serious problems occur during the pregnancy, then the baby can have problems including growth retardation, prematurity, premature rupture of the membranes with early delivery and other problems. DELIVERY The use of fetal scalp electrodes and fetal scalp blood sampling during labor should be avoided. This decreases the chances of passing infected blood from the mother to the baby. Breastfeeding should be avoided following delivery. You may discuss all this information with your caregiver.  HOME CARE INSTRUCTIONS   Only take over-the-counter or prescription medicine for pain, discomfort or fever as directed by your caregiver.  Do not take aspirin. It can cause bleeding.  Take your antiviral HIV medications as directed.  Consider joining a HIV support group or counseling.  Get rest and sleep.  Eat a balanced diet.  Take your prenatal vitamins and any other supplements as directed.  Get vaccinated against Hepatitis A and B, influenza, pneumococcal pneumonia, tetanus, polio, measles, mumps, whooping cough and other diseases that you may be more likely to get if you did not get these vaccinations.  Do not smoke, take illegal drugs or drink alcohol.  Inform all your sexual contacts that you have been diagnosed with HIV. SEEK MEDICAL CARE IF:   You think you have or been exposed to HIV.  You develop dizziness.  You have a lingering sore throat with or without fever.  You develop ulcers in your mouth or on your genital area.  You develop an uncontrollable cough.  You have severe headaches not controlled with recommended medicine.  You become confused or depressed. SEEK IMMEDIATE MEDICAL CARE IF:   You faint or pass out.  You have a temperature of 100 F (37.8 C) or higher.  You have a convulsion.  You a have problem breathing.  You cough up blood.  You have uncontrolled vomiting or diarrhea.  You develop abdominal pain.  You have leakage of  fluid or blood from the vagina.  You develop uterine contractions.  You develop visual problems.  You develop purple or blue skin blotches. Document Released: 01/14/2001 Document Revised: 12/31/2011 Document Reviewed: 08/07/2013 [facility] Patient Information 2015 [place], [place]. This information is not intended to replace advice given to you by your health care provider. Make sure you discuss any questions you have with your health care provider.

## 2015-03-22 ENCOUNTER — Telehealth: Payer: Self-pay

## 2015-03-22 NOTE — Telephone Encounter (Signed)
Per Dr. Penne LashLeggett, patient needs high risk clinic appointment for New OB and appointment with ID as she has HIV. Message sent to admin pool to schedule and call patient with HR NOB appointment. Per chart review patient last seen at [facility] in 07/22/2013, has no showed appointments more recently. Contacted [facility] at [phone]-- admin staff to speak with nurse about getting patient and appointment (appointments are out to July but may be able to get her in sooner given circumstances). Reports she will call patient with appointment.

## 2015-03-28 ENCOUNTER — Other Ambulatory Visit (HOSPITAL_COMMUNITY): Payer: Self-pay | Admitting: Advanced Practice Midwife

## 2015-03-28 ENCOUNTER — Ambulatory Visit (HOSPITAL_COMMUNITY): Payer: Medicaid Other

## 2015-03-28 ENCOUNTER — Encounter: Payer: Self-pay | Admitting: Internal Medicine

## 2015-03-28 ENCOUNTER — Ambulatory Visit (INDEPENDENT_AMBULATORY_CARE_PROVIDER_SITE_OTHER): Payer: Self-pay | Admitting: Internal Medicine

## 2015-03-28 ENCOUNTER — Ambulatory Visit (HOSPITAL_COMMUNITY)
Admission: RE | Admit: 2015-03-28 | Discharge: 2015-03-28 | Disposition: A | Discharge status: Home / Self Care | Payer: Self-pay | Source: Ambulatory Visit | Attending: Advanced Practice Midwife | Admitting: Advanced Practice Midwife

## 2015-03-28 VITALS — BP 142/81 | HR 123 | Temp 98.3°F | Ht 70.0 in | Wt >= 6400 oz

## 2015-03-28 DIAGNOSIS — O3421 Maternal care for scar from previous cesarean delivery: Secondary | ICD-10-CM | POA: Insufficient documentation

## 2015-03-28 DIAGNOSIS — O99210 Obesity complicating pregnancy, unspecified trimester: Secondary | ICD-10-CM | POA: Insufficient documentation

## 2015-03-28 DIAGNOSIS — Z1389 Encounter for screening for other disorder: Secondary | ICD-10-CM

## 2015-03-28 DIAGNOSIS — O09292 Supervision of pregnancy with other poor reproductive or obstetric history, second trimester: Secondary | ICD-10-CM | POA: Insufficient documentation

## 2015-03-28 DIAGNOSIS — Z3201 Encounter for pregnancy test, result positive: Secondary | ICD-10-CM

## 2015-03-28 DIAGNOSIS — B20 Human immunodeficiency virus [HIV] disease: Secondary | ICD-10-CM

## 2015-03-28 DIAGNOSIS — Z3A24 24 weeks gestation of pregnancy: Secondary | ICD-10-CM | POA: Insufficient documentation

## 2015-03-28 DIAGNOSIS — Z36 Encounter for antenatal screening of mother: Secondary | ICD-10-CM | POA: Insufficient documentation

## 2015-03-28 DIAGNOSIS — Z21 Asymptomatic human immunodeficiency virus [HIV] infection status: Secondary | ICD-10-CM | POA: Insufficient documentation

## 2015-03-28 DIAGNOSIS — O30049 Twin pregnancy, dichorionic/diamniotic, unspecified trimester: Secondary | ICD-10-CM | POA: Insufficient documentation

## 2015-03-28 DIAGNOSIS — N912 Amenorrhea, unspecified: Secondary | ICD-10-CM

## 2015-03-28 DIAGNOSIS — O30042 Twin pregnancy, dichorionic/diamniotic, second trimester: Secondary | ICD-10-CM | POA: Insufficient documentation

## 2015-03-28 DIAGNOSIS — Z113 Encounter for screening for infections with a predominantly sexual mode of transmission: Secondary | ICD-10-CM

## 2015-03-28 DIAGNOSIS — Z79899 Other long term (current) drug therapy: Secondary | ICD-10-CM

## 2015-03-28 DIAGNOSIS — Z3A27 27 weeks gestation of pregnancy: Secondary | ICD-10-CM | POA: Insufficient documentation

## 2015-03-28 DIAGNOSIS — Z363 Encounter for antenatal screening for malformations: Secondary | ICD-10-CM

## 2015-03-28 DIAGNOSIS — O98712 Human immunodeficiency virus [HIV] disease complicating pregnancy, second trimester: Secondary | ICD-10-CM | POA: Insufficient documentation

## 2015-03-28 MED ORDER — EMTRICITABINE-TENOFOVIR DF 200-300 MG PO TABS: 1.0000 | ORAL_TABLET | Freq: Every day | ORAL | Status: DC

## 2015-03-28 MED ORDER — RITONAVIR 100 MG PO TABS: 100.0000 mg | ORAL_TABLET | Freq: Every day | ORAL | Status: DC

## 2015-03-28 MED ORDER — DARUNAVIR ETHANOLATE 800 MG PO TABS: 800.0000 mg | ORAL_TABLET | Freq: Every day | ORAL | Status: DC

## 2015-03-28 NOTE — Assessment & Plan Note (Signed)
With her history of stopping and starting, I will check a genotype today the see if she has developed any resistance. I discussed with her the concern for resistance. I also discussed the need to stay on treatment even after her pregnancy. I will start her on Prezista, Norvir and Truvada once she is able to get that through the drug assistance program. She has about a 1 month supply of her Kaletra and Combivir and hopefully will not run out prior to getting the new regimen. This was put in as a priority drug assistance program application. I will have her get labs today and see her in about 6 weeks.

## 2015-03-28 NOTE — Progress Notes (Signed)
Subjective:    Patient ID: Eileen [name], female    DOB: 06/16/1986, 29 y.o.   MRN: [id number]  HPI She is here to reestablish care. She has very intermittent follow-up and has been HIV positive for 10 years. She has been taking medication during her pregnancies which have included Kaletra and Combivir and in 2014 when she was last here, she was to continue on Complera. She though has not been on any medication since her last pregnancy with HER-2-year-old child. She recently found out she was pregnant for about 5 months [facility]. She is started Kaletra and Combivir which she had a home already from her previous pregnancies. She has about a 1 month supply left and has been taking it for about one month. She is depressed and tearful during exam. Her husband recently left her about 2 months ago.  She is interested in treatment and hopes to be able to stay in care. She though does have trouble sleeping and concentrating and does feel she is depressed, though is not interested in counseling for home visit nurse.   Review of Systems  Constitutional: Negative for fatigue.  HENT: Negative for trouble swallowing.   Gastrointestinal: Negative for nausea and diarrhea.  Skin: Negative for rash.  Neurological: Negative for dizziness and light-headedness.  Hematological: Negative for adenopathy.       Objective:   Physical Exam  Constitutional: She appears well-developed and well-nourished. No distress.  Morbidly obese  Eyes: No scleral icterus.  Cardiovascular: Normal rate, regular rhythm and normal heart sounds.   No murmur heard. Pulmonary/Chest: Effort normal and breath sounds normal. No respiratory distress.  Lymphadenopathy:    She has no cervical adenopathy.  Skin: No rash noted.          Assessment & Plan:   

## 2015-03-29 LAB — COMPLETE METABOLIC PANEL WITH GFR
ALBUMIN: 3 g/dL — AB (ref 3.5–5.2)
ALK PHOS: 60 U/L (ref 39–117)
ALT: 8 U/L (ref 0–35)
AST: 13 U/L (ref 0–37)
BILIRUBIN TOTAL: 0.4 mg/dL (ref 0.2–1.2)
BUN: 4 mg/dL — ABNORMAL LOW (ref 6–23)
CALCIUM: 8.5 mg/dL (ref 8.4–10.5)
CHLORIDE: 101 meq/L (ref 96–112)
CO2: 26 meq/L (ref 19–32)
Creat: 0.6 mg/dL (ref 0.50–1.10)
GFR, Est African American: >89 mL/min
GFR, Est Non African American: >89 mL/min
GLUCOSE: 98 mg/dL (ref 70–99)
POTASSIUM: 3.6 meq/L (ref 3.5–5.3)
Sodium: 136 mEq/L (ref 135–145)
TOTAL PROTEIN: 6.7 g/dL (ref 6.0–8.3)

## 2015-03-29 LAB — CBC WITH DIFFERENTIAL/PLATELET
Basophils Absolute: 0 10*3/uL (ref 0.0–0.1)
Basophils Relative: 0 % (ref 0–1)
EOS ABS: 0.1 10*3/uL (ref 0.0–0.7)
Eosinophils Relative: 1 % (ref 0–5)
HEMATOCRIT: 31 % — AB (ref 36.0–46.0)
HEMOGLOBIN: 10.3 g/dL — AB (ref 12.0–15.0)
LYMPHS ABS: 1.6 10*3/uL (ref 0.7–4.0)
Lymphocytes Relative: 26 % (ref 12–46)
MCH: 27.4 pg (ref 26.0–34.0)
MCHC: 33.2 g/dL (ref 30.0–36.0)
MCV: 82.4 fL (ref 78.0–100.0)
MONO ABS: 0.4 10*3/uL (ref 0.1–1.0)
MONOS PCT: 6 % (ref 3–12)
MPV: 8.8 fL (ref 8.6–12.4)
NEUTROS ABS: 4 10*3/uL (ref 1.7–7.7)
Neutrophils Relative %: 67 % (ref 43–77)
PLATELETS: 162 10*3/uL (ref 150–400)
RBC: 3.76 MIL/uL — AB (ref 3.87–5.11)
RDW: 15.5 % (ref 11.5–15.5)
WBC: 6 10*3/uL (ref 4.0–10.5)

## 2015-03-29 LAB — LIPID PANEL
Cholesterol: 215 mg/dL — ABNORMAL HIGH (ref 0–200)
HDL: 44 mg/dL — ABNORMAL LOW (ref >=46)
LDL Cholesterol: 112 mg/dL — ABNORMAL HIGH (ref 0–99)
TRIGLYCERIDES: 295 mg/dL — AB (ref <150)
Total CHOL/HDL Ratio: 4.9 Ratio
VLDL: 59 mg/dL — AB (ref 0–40)

## 2015-03-29 LAB — RPR

## 2015-03-30 LAB — HIV-1 RNA ULTRAQUANT REFLEX TO GENTYP+
HIV 1 RNA Quant: 167 copies/mL — ABNORMAL HIGH (ref <20)
HIV-1 RNA QUANT, LOG: 2.22 {Log} — AB (ref <1.30)

## 2015-03-30 LAB — URINE CYTOLOGY ANCILLARY ONLY
Chlamydia: NEGATIVE
Neisseria Gonorrhea: NEGATIVE

## 2015-03-30 LAB — T-HELPER CELL (CD4) - (RCID CLINIC ONLY)
CD4 T CELL HELPER: 28 % — AB (ref 33–55)
CD4 T Cell Abs: 410 /uL (ref 400–2700)

## 2015-04-02 LAB — HLA B*5701: HLA-B*5701 w/rflx HLA-B High: NEGATIVE

## 2015-04-05 ENCOUNTER — Telehealth: Payer: Self-pay | Admitting: Internal Medicine

## 2015-04-05 ENCOUNTER — Other Ambulatory Visit: Payer: Self-pay | Admitting: *Deleted

## 2015-04-05 MED ORDER — PRENATAL MULTIVITAMIN CH: 1.0000 | ORAL_TABLET | Freq: Every day | ORAL | Status: DC

## 2015-04-05 MED ORDER — DARUNAVIR ETHANOLATE 800 MG PO TABS: 800.0000 mg | ORAL_TABLET | Freq: Every day | ORAL | Status: DC

## 2015-04-05 MED ORDER — EMTRICITABINE-TENOFOVIR DF 200-300 MG PO TABS: 1.0000 | ORAL_TABLET | Freq: Every day | ORAL | Status: DC

## 2015-04-05 MED ORDER — RITONAVIR 100 MG PO TABS: 100.0000 mg | ORAL_TABLET | Freq: Every day | ORAL | Status: DC

## 2015-04-05 NOTE — Telephone Encounter (Signed)
Prescriptions sent to [facility]. Per Rob, Pharmacist, they were ADAP approved without copay due.

## 2015-04-06 ENCOUNTER — Encounter: Payer: Medicaid Other | Admitting: Family

## 2015-04-07 ENCOUNTER — Encounter: Payer: Medicaid Other | Admitting: Family

## 2015-04-14 ENCOUNTER — Emergency Department (HOSPITAL_COMMUNITY)
Admission: EM | Admit: 2015-04-14 | Discharge: 2015-04-14 | Disposition: A | Discharge status: Home / Self Care | Payer: Medicaid Other | Attending: Emergency Medicine | Admitting: Emergency Medicine

## 2015-04-14 ENCOUNTER — Encounter (HOSPITAL_COMMUNITY): Payer: Self-pay | Admitting: *Deleted

## 2015-04-14 DIAGNOSIS — Y9389 Activity, other specified: Secondary | ICD-10-CM | POA: Insufficient documentation

## 2015-04-14 DIAGNOSIS — Y998 Other external cause status: Secondary | ICD-10-CM | POA: Insufficient documentation

## 2015-04-14 DIAGNOSIS — Y9289 Other specified places as the place of occurrence of the external cause: Secondary | ICD-10-CM | POA: Insufficient documentation

## 2015-04-14 DIAGNOSIS — F17210 Nicotine dependence, cigarettes, uncomplicated: Secondary | ICD-10-CM | POA: Insufficient documentation

## 2015-04-14 DIAGNOSIS — Z79899 Other long term (current) drug therapy: Secondary | ICD-10-CM | POA: Insufficient documentation

## 2015-04-14 DIAGNOSIS — O98712 Human immunodeficiency virus [HIV] disease complicating pregnancy, second trimester: Secondary | ICD-10-CM | POA: Insufficient documentation

## 2015-04-14 DIAGNOSIS — S8992XA Unspecified injury of left lower leg, initial encounter: Secondary | ICD-10-CM | POA: Insufficient documentation

## 2015-04-14 DIAGNOSIS — M25562 Pain in left knee: Secondary | ICD-10-CM

## 2015-04-14 DIAGNOSIS — Z8619 Personal history of other infectious and parasitic diseases: Secondary | ICD-10-CM | POA: Insufficient documentation

## 2015-04-14 DIAGNOSIS — O99212 Obesity complicating pregnancy, second trimester: Secondary | ICD-10-CM | POA: Insufficient documentation

## 2015-04-14 DIAGNOSIS — Z21 Asymptomatic human immunodeficiency virus [HIV] infection status: Secondary | ICD-10-CM | POA: Insufficient documentation

## 2015-04-14 DIAGNOSIS — O10012 Pre-existing essential hypertension complicating pregnancy, second trimester: Secondary | ICD-10-CM | POA: Insufficient documentation

## 2015-04-14 DIAGNOSIS — O99332 Smoking (tobacco) complicating pregnancy, second trimester: Secondary | ICD-10-CM | POA: Insufficient documentation

## 2015-04-14 DIAGNOSIS — O9A212 Injury, poisoning and certain other consequences of external causes complicating pregnancy, second trimester: Secondary | ICD-10-CM | POA: Insufficient documentation

## 2015-04-14 DIAGNOSIS — E669 Obesity, unspecified: Secondary | ICD-10-CM | POA: Insufficient documentation

## 2015-04-14 DIAGNOSIS — W228XXA Striking against or struck by other objects, initial encounter: Secondary | ICD-10-CM | POA: Insufficient documentation

## 2015-04-14 DIAGNOSIS — Z3A27 27 weeks gestation of pregnancy: Secondary | ICD-10-CM | POA: Insufficient documentation

## 2015-04-14 MED ORDER — OXYCODONE-ACETAMINOPHEN 5-325 MG PO TABS: 1.0000 | ORAL_TABLET | Freq: Four times a day (QID) | ORAL | Status: DC | PRN

## 2015-04-14 NOTE — ED Notes (Signed)
Pt states that she was cleaning her car on Monday and was leaning across to open the door and her knee buckled and struck the back seat; pt states that the knee is painful and gets "stiff"; pt states that she has been taking tylenol without relief; pt states that she is 11months preg and doesn't know what else she can do

## 2015-04-14 NOTE — Discharge Instructions (Signed)
Please follow up with your primary care physician in 1-2 days. If you do not have one please call the [facility] number listed above. Please follow up with your ob/gyn to schedule a follow up appointment.  Please take pain medication and/or muscle relaxants as prescribed and as needed for pain. Please do not drive on narcotic pain medication or on muscle relaxants. Please read all discharge instructions and return precautions.   Knee Pain The knee is the complex joint between your thigh and your lower leg. It is made up of bones, tendons, ligaments, and cartilage. The bones that make up the knee are:  The femur in the thigh.  The tibia and fibula in the lower leg.  The patella or kneecap riding in the groove on the lower femur. CAUSES  Knee pain is a common complaint with many causes. A few of these causes are:  Injury, such as:  A ruptured ligament or tendon injury.  Torn cartilage.  Medical conditions, such as:  Gout  Arthritis  Infections  Overuse, over training, or overdoing a physical activity. Knee pain can be minor or severe. Knee pain can accompany debilitating injury. Minor knee problems often respond well to self-care measures or get well on their own. More serious injuries may need medical intervention or even surgery. SYMPTOMS The knee is complex. Symptoms of knee problems can vary widely. Some of the problems are:  Pain with movement and weight bearing.  Swelling and tenderness.  Buckling of the knee.  Inability to straighten or extend your knee.  Your knee locks and you cannot straighten it.  Warmth and redness with pain and fever.  Deformity or dislocation of the kneecap. DIAGNOSIS  Determining what is wrong may be very straight forward such as when there is an injury. It can also be challenging because of the complexity of the knee. Tests to make a diagnosis may include:  Your caregiver taking a history and doing a physical  exam.  Routine X-rays can be used to rule out other problems. X-rays will not reveal a cartilage tear. Some injuries of the knee can be diagnosed by:  Arthroscopy a surgical technique by which a small video camera is inserted through tiny incisions on the sides of the knee. This procedure is used to examine and repair internal knee joint problems. Tiny instruments can be used during arthroscopy to repair the torn knee cartilage (meniscus).  Arthrography is a radiology technique. A contrast liquid is directly injected into the knee joint. Internal structures of the knee joint then become visible on X-ray film.  An MRI scan is a non X-ray radiology procedure in which magnetic fields and a computer produce two- or three-dimensional images of the inside of the knee. Cartilage tears are often visible using an MRI scanner. MRI scans have largely replaced arthrography in diagnosing cartilage tears of the knee.  Blood work.  Examination of the fluid that helps to lubricate the knee joint (synovial fluid). This is done by taking a sample out using a needle and a syringe. TREATMENT The treatment of knee problems depends on the cause. Some of these treatments are:  Depending on the injury, proper casting, splinting, surgery, or physical therapy care will be needed.  Give yourself adequate recovery time. Do not overuse your joints. If you begin to get sore during workout routines, back off. Slow down or do fewer repetitions.  For repetitive activities such as cycling or running, maintain your strength and nutrition.  Alternate  muscle groups. For example, if you are a weight lifter, work the upper body on one day and the lower body the next. °· Either tight or weak muscles do not give the proper support for your knee. Tight or weak muscles do not absorb the stress placed on the knee joint. Keep the muscles surrounding the knee strong. °· Take care of mechanical problems. °¨ If you have flat feet, orthotics  or special shoes may help. See your caregiver if you need help. °¨ Arch supports, sometimes with wedges on the inner or outer aspect of the heel, can help. These can shift pressure away from the side of the knee most bothered by osteoarthritis. °¨ A brace called an "unloader" brace also may be used to help ease the pressure on the most arthritic side of the knee. °· If your caregiver has prescribed crutches, braces, wraps or ice, use as directed. The acronym for this is PRICE. This means protection, rest, ice, compression, and elevation. °· Nonsteroidal anti-inflammatory drugs (NSAIDs), can help relieve pain. But if taken immediately after an injury, they may actually increase swelling. Take NSAIDs with food in your stomach. Stop them if you develop stomach problems. Do not take these if you have a history of ulcers, stomach pain, or bleeding from the bowel. Do not take without your caregiver's approval if you have problems with fluid retention, heart failure, or kidney problems. °· For ongoing knee problems, physical therapy may be helpful. °· Glucosamine and chondroitin are over-the-counter dietary supplements. Both may help relieve the pain of osteoarthritis in the knee. These medicines are different from the usual anti-inflammatory drugs. Glucosamine may decrease the rate of cartilage destruction. °· Injections of a corticosteroid drug into your knee joint may help reduce the symptoms of an arthritis flare-up. They may provide pain relief that lasts a few months. You may have to wait a few months between injections. The injections do have a small increased risk of infection, water retention, and elevated blood sugar levels. °· Hyaluronic acid injected into damaged joints may ease pain and provide lubrication. These injections may work by reducing inflammation. A series of shots may give relief for as long as 6 months. °· Topical painkillers. Applying certain ointments to your skin may help relieve the pain and  stiffness of osteoarthritis. Ask your pharmacist for suggestions. Many over the-counter products are approved for temporary relief of arthritis pain. °· In some countries, doctors often prescribe topical NSAIDs for relief of chronic conditions such as arthritis and tendinitis. A review of treatment with NSAID creams found that they worked as well as oral medications but without the serious side effects. °PREVENTION °· Maintain a healthy weight. Extra pounds put more strain on your joints. °· Get strong, stay limber. Weak muscles are a common cause of knee injuries. Stretching is important. Include flexibility exercises in your workouts. °· Be smart about exercise. If you have osteoarthritis, chronic knee pain or recurring injuries, you may need to change the way you exercise. This does not mean you have to stop being active. If your knees ache after jogging or playing basketball, consider switching to swimming, water aerobics, or other low-impact activities, at least for a few days a week. Sometimes limiting high-impact activities will provide relief. °· Make sure your shoes fit well. Choose footwear that is right for your sport. °· Protect your knees. Use the proper gear for knee-sensitive activities. Use kneepads when playing volleyball or laying carpet. Buckle your seat belt every time you drive.   Most shattered kneecaps occur in car accidents.  Rest when you are tired. SEEK MEDICAL CARE IF:  You have knee pain that is continual and does not seem to be getting better.  SEEK IMMEDIATE MEDICAL CARE IF:  Your knee joint feels hot to the touch and you have a high fever. MAKE SURE YOU:   Understand these instructions.  Will watch your condition.  Will get help right away if you are not doing well or get worse. Document Released: 08/05/2007 Document Revised: 12/31/2011 Document Reviewed: 08/05/2007 [facility] Patient Information 2015 [employer] [place]. This information is not intended to replace advice given  to you by your health care provider. Make sure you discuss any questions you have with your health care provider.

## 2015-04-14 NOTE — ED Provider Notes (Signed)
CSN: [id number]     Arrival date & time 04/14/15  2000 History   This chart was scribed for Francee Piccolo, PA-C working with No att. providers found by Elveria Rising, ED Scribe. This patient was seen in room WTR9/WTR9 and the patient's care was started at 9:33 PM.   Chief Complaint  Patient presents with  . Knee Injury   The history is provided by the patient. No language interpreter was used.   HPI Comments: Eileen [name] is a 29 y.o. female who presents to the Emergency Department with left knee injury incurred four days ago while cleaning interior of her car. Patient reports that her knee buckled and she struck her knee on the seat. Patient reports worsening pain the following morning and reports stiffening of her knee after periods of inactivity. Patient describes shooting pain extending from anterior knee into her lower leg. Patient has been treating her symptoms with ice and Tylenol, but states she is unable to achieve relief. Patient is 7months pregnant and is uncertain of permissible drug treatments. Patient denies fever, redness or warmth at the site, numbness or tingling in the leg, headache, abdominal pain, vaginal bleeding or discharge, chest pain, urinary symptoms, seizures, or syncope. She does endorse feeling fetuses moving. Patient with PMHx of HIV reports CD4 count is undetectable. Patient is taking Labetalol for her Hypertension, she denies missing doses.  Past Medical History  Diagnosis Date  . HIV infection   . Thyroid disease   . Genital herpes   . Syphilis     was treated  . Obesity   . HIV (human immunodeficiency virus infection)   . Anesthesia complication-postpartum     stopped breathing after spinal with c/s, in ICU  . Complication of anesthesia   . PONV (postoperative nausea and vomiting)   . Hypertension     with pregnancy  . Gestational diabetes     resolved - Hx with 1st preg but not with 2nd or this preganacy   Past Surgical History  Procedure  Laterality Date  . Cesarean section    . Wisdom tooth extraction    . Cesarean section N/A 07/07/2013    Procedure: REPEAT CESAREAN SECTION, ;  Surgeon: Lesly Dukes, MD;  Location: [facility];  Service: Obstetrics;  Laterality: N/A;   Family History  Problem Relation Age of Onset  . Anesthesia problems Neg Hx   . Hypotension Neg Hx   . Malignant hyperthermia Neg Hx   . Pseudochol deficiency Neg Hx   . Diabetes Maternal Grandmother    History  Substance Use Topics  . Smoking status: Current Every Day Smoker -- 0.10 packs/day for 10 years    Types: Cigarettes  . Smokeless tobacco: Never Used  . Alcohol Use: No   OB History    Gravida Para Term Preterm AB TAB SAB Ectopic Multiple Living   Review of Systems  Constitutional: Negative for fever and chills.  Respiratory: Negative for shortness of breath.   Cardiovascular: Negative for chest pain.  Gastrointestinal: Negative for abdominal pain.  Genitourinary: Negative for vaginal bleeding and vaginal discharge.  Musculoskeletal: Positive for arthralgias.  Neurological: Negative for seizures, weakness, numbness and headaches.  All other systems reviewed and are negative.   Allergies  Sulfonamide derivatives and Other  Home Medications   Prior to Admission medications   Medication Sig Start Date End Date Taking? Authorizing Provider  acetaminophen (TYLENOL) 500 MG  tablet Take 1,500 mg by mouth every 4 (four) hours as needed for moderate pain (pain).   Yes Historical Provider, MD  Darunavir Ethanolate (PREZISTA) 800 MG tablet Take 1 tablet (800 mg total) by mouth daily. 04/05/15  Yes Gardiner Barefoot, MD  emtricitabine-tenofovir (TRUVADA) 200-300 MG per tablet Take 1 tablet by mouth daily. 04/05/15  Yes Gardiner Barefoot, MD  labetalol (NORMODYNE) 100 MG tablet TAKE 2 TABLETS BY MOUTH TWICE A DAY 07/01/13  Yes Peggy Constant, MD  ritonavir (NORVIR) 100 MG TABS tablet Take 1 tablet (100 mg total) by mouth daily.  04/05/15  Yes Gardiner Barefoot, MD  ibuprofen (ADVIL,MOTRIN) 600 MG tablet Take 1 tablet (600 mg total) by mouth every 6 (six) hours. Patient not taking: Reported on 03/28/2015 07/09/13   Arabella Merles, CNM  oxyCODONE-acetaminophen (PERCOCET/ROXICET) 5-325 MG per tablet Take 1-2 tablets by mouth every 6 (six) hours as needed for severe pain. 04/14/15   Francee Piccolo, PA-C  Prenatal Vit-Fe Fumarate-FA (PRENATAL MULTIVITAMIN) TABS tablet Take 1 tablet by mouth daily at 12 noon. Patient not taking: Reported on 04/14/2015 04/05/15   Gardiner Barefoot, MD   Triage Vitals: BP 154/74 mmHg  Pulse 95  Temp(Src) 98.3 F (36.8 C) (Oral)  Resp 17  SpO2 100%  LMP 10/11/2014 Physical Exam  Constitutional: She is oriented to person, place, and time. She appears well-developed and well-nourished. No distress.  Exam limited because of body habitus.   HENT:  Head: Normocephalic and atraumatic.  Right Ear: External ear normal.  Left Ear: External ear normal.  Nose: Nose normal.  Mouth/Throat: Oropharynx is clear and moist.  Eyes: Conjunctivae are normal.  Neck: Normal range of motion. Neck supple.  No nuchal rigidity.   Cardiovascular: Normal rate, regular rhythm, normal heart sounds and intact distal pulses.   Pulmonary/Chest: Effort normal.  Abdominal: Soft. There is no tenderness.  Obese abdomen  Musculoskeletal: Normal range of motion.       Left knee: She exhibits normal range of motion, no swelling, no effusion, no ecchymosis, no erythema and no bony tenderness. Tenderness found. Lateral joint line tenderness noted.       Left upper leg: Normal.       Left lower leg: Normal.  Neurological: She is alert and oriented to person, place, and time.  Skin: Skin is warm and dry. She is not diaphoretic.  Psychiatric: She has a normal mood and affect.  Nursing note and vitals reviewed.   ED Course  Procedures (including critical care time) Medications - No data to display  COORDINATION OF  CARE: 9:44 PM- Discussed treatment plan with patient at bedside and patient agreed to plan.   Labs Review Labs Reviewed - No data to display  Imaging Review No results found.   EKG Interpretation None      Declines x-ray. Discussed risks vs benefits of narcotic medications in pregnancy. Patient aware of potential side effects.  MDM   Final diagnoses:  Left knee pain    Filed Vitals:   04/14/15 2247  BP: 123/61  Pulse:   Temp:   Resp:    Afebrile, NAD, non-toxic appearing, AAOx4.   Patient presenting to the emergency department for left knee pain. Exam is limited by body habitus. There is no gross swelling, no erythema or warmth or effusion. Low suspicion for infectious source. Pain along lateral joint line, suspicious for meniscal or ligament injury. Discussed risks of x-ray while pregnant, patient declines at this time. Will treat symptomatically. Also  discussed risks of narcotic pain medication with patient who is aware of risks and accepts. Patient is initially hypertensive upon arrival. No symptoms of preeclampsia, no headache. Patient is also not complaining of any abdominal pain, vaginal bleeding or discharge. Repeat blood pressure after 10 minutes of rest reveals a normal blood pressure of 123/61. Advised patient follow up with PCP for reevaluation of knee pain with likely orthopedic referral. Also advised patient to keep OB/GYN follow-up appointment next Thursday. Return precautions were discussed. Patient is agreeable to plan and stable at time of discharge. Patient d/w with Dr. Fayrene Fearing, agrees with plan.      I personally performed the services described in this documentation, which was scribed in my presence. The recorded information has been reviewed and is accurate.     Francee Piccolo, PA-C 04/15/15 0154  Rolland Porter, MD 04/22/15 1355

## 2015-04-29 ENCOUNTER — Other Ambulatory Visit (HOSPITAL_COMMUNITY)
Admission: RE | Admit: 2015-04-29 | Discharge: 2015-04-29 | Disposition: A | Discharge status: Home / Self Care | Payer: Medicaid Other | Source: Ambulatory Visit | Attending: Family | Admitting: Family

## 2015-04-29 ENCOUNTER — Encounter: Payer: Self-pay | Admitting: Family

## 2015-04-29 ENCOUNTER — Ambulatory Visit (INDEPENDENT_AMBULATORY_CARE_PROVIDER_SITE_OTHER): Payer: Self-pay | Admitting: Family

## 2015-04-29 VITALS — BP 143/88 | HR 77 | Temp 98.4°F | Wt >= 6400 oz

## 2015-04-29 DIAGNOSIS — O30043 Twin pregnancy, dichorionic/diamniotic, third trimester: Secondary | ICD-10-CM

## 2015-04-29 DIAGNOSIS — O34219 Maternal care for unspecified type scar from previous cesarean delivery: Secondary | ICD-10-CM | POA: Insufficient documentation

## 2015-04-29 DIAGNOSIS — O3421 Maternal care for scar from previous cesarean delivery: Secondary | ICD-10-CM

## 2015-04-29 DIAGNOSIS — Z1151 Encounter for screening for human papillomavirus (HPV): Secondary | ICD-10-CM | POA: Insufficient documentation

## 2015-04-29 DIAGNOSIS — O0993 Supervision of high risk pregnancy, unspecified, third trimester: Secondary | ICD-10-CM

## 2015-04-29 DIAGNOSIS — Z01419 Encounter for gynecological examination (general) (routine) without abnormal findings: Secondary | ICD-10-CM | POA: Diagnosis not present

## 2015-04-29 DIAGNOSIS — Z113 Encounter for screening for infections with a predominantly sexual mode of transmission: Secondary | ICD-10-CM | POA: Diagnosis present

## 2015-04-29 DIAGNOSIS — O98713 Human immunodeficiency virus [HIV] disease complicating pregnancy, third trimester: Secondary | ICD-10-CM

## 2015-04-29 DIAGNOSIS — O0933 Supervision of pregnancy with insufficient antenatal care, third trimester: Secondary | ICD-10-CM

## 2015-04-29 DIAGNOSIS — O0990 Supervision of high risk pregnancy, unspecified, unspecified trimester: Secondary | ICD-10-CM | POA: Insufficient documentation

## 2015-04-29 LAB — POCT URINALYSIS DIP (DEVICE)
Bilirubin Urine: NEGATIVE
Glucose, UA: NEGATIVE mg/dL
Ketones, ur: NEGATIVE mg/dL
Leukocytes, UA: NEGATIVE
Nitrite: NEGATIVE
Protein, ur: NEGATIVE mg/dL
UROBILINOGEN UA: 1 mg/dL (ref 0.0–1.0)
pH: 6 (ref 5.0–8.0)

## 2015-04-29 NOTE — Progress Notes (Signed)
Subjective:    Eileen [name] is a [id number] 30.5wks being seen today for her first obstetrical visit.  Her obstetrical history is significant for obesity and HIV, Di/Di twins, chronic hypertension, and prior csection x 3.   Pregnancy history fully reviewed.  Patient reports backache.  Filed Vitals:   04/29/15 0929  BP: 143/88  Pulse: 77  Temp: 98.4 F (36.9 C)  Weight: 194.094 kg (427 lb 14.4 oz)    HISTORY: OB History  Gravida Para Term Preterm AB SAB TAB Ectopic Multiple Living  5 3 3  0 1 1 0 0 0 3    # Outcome Date GA Lbr Len/2nd Weight Sex Delivery Anes PTL Lv  5 Current           4 Term 07/07/13 3967w0d  3.44 kg (7 lb 9.3 oz) F CS-LTranv EPI  Y  3 SAB 11/2011          2 Term 07/31/10 7157w0d   F CS-LTranv Spinal  Y  1 Term 02/18/06 5225w0d  3.487 kg (7 lb 11 oz) M CS-Unspec Spinal  Y     Comments: GDM, HTN     Past Medical History  Diagnosis Date  . HIV infection   . Thyroid disease   . Genital herpes   . Syphilis     was treated  . Obesity   . HIV (human immunodeficiency virus infection)   . Anesthesia complication-postpartum     stopped breathing after spinal with c/s, in ICU  . Complication of anesthesia   . PONV (postoperative nausea and vomiting)   . Hypertension     with pregnancy  . Gestational diabetes     resolved - Hx with 1st preg but not with 2nd or this preganacy   Past Surgical History  Procedure Laterality Date  . Cesarean section    . Wisdom tooth extraction    . Cesarean section N/A 07/07/2013    Procedure: REPEAT CESAREAN SECTION, ;  Surgeon: Lesly DukesKelly H Leggett, MD;  Location: [facility];  Service: Obstetrics;  Laterality: N/A;   Family History  Problem Relation Age of Onset  . Anesthesia problems Neg Hx   . Hypotension Neg Hx   . Malignant hyperthermia Neg Hx   . Pseudochol deficiency Neg Hx   . Diabetes Maternal Grandmother      Exam   BP 143/88 mmHg  Pulse 77  Temp(Src) 98.4 F (36.9 C)  Wt 194.094 kg (427 lb 14.4 oz)  LMP  10/11/2014 Uterine Size: Difficult to palpate  Pelvic Exam:    Perineum: No Hemorrhoids, Normal Perineum   Vulva: normal   Vagina:  normal mucosa, normal discharge, no palpable nodules   pH: Not done   Cervix: no bleeding following Pap, no cervical motion tenderness and no lesions   Adnexa: normal adnexa and no mass, fullness, tenderness   Bony Pelvis: Adequate  System: Breast:  No nipple retraction or dimpling, No nipple discharge or bleeding, No axillary or supraclavicular adenopathy, Normal to palpation without dominant masses   Skin: normal coloration and turgor, no rashes    Neurologic: negative   Extremities: normal strength, tone, and muscle mass   HEENT neck supple with midline trachea and thyroid without masses   Mouth/Teeth mucous membranes moist, pharynx normal without lesions   Neck supple and no masses   Cardiovascular: regular rate and rhythm, no murmurs or gallops   Respiratory:  appears well, vitals normal, no respiratory distress, acyanotic, normal RR, neck free of mass or  lymphadenopathy, chest clear, no wheezing, crepitations, rhonchi, normal symmetric air entry   Abdomen: soft, non-tender; bowel sounds normal; no masses,  no organomegaly   Urinary: urethral meatus normal     Assessment:    Pregnancy:   29 y.o. [id number] at 31.5 wks IUP Patient Active Problem List   Diagnosis Date Noted  . Supervision of high-risk pregnancy 04/29/2015  . Previous cesarean delivery affecting pregnancy, antepartum 04/29/2015  . Screening examination for venereal disease 03/28/2015  . Encounter for long-term (current) use of medications 03/28/2015  . Dichorionic diamniotic twin pregnancy in second trimester   . Encounter for routine screening for malformation using ultrasonics   . Maternal morbid obesity, antepartum   . Obesity (BMI 30-39.9) 07/22/2013  . HYPERTENSION 12/06/2006  . Morbid obesity 11/12/2006  . Human immunodeficiency virus (HIV) disease 11/06/2005  Di/Di Twins       Plan:     Initial labs drawn. Unable to auscultate > to Eileen [name] for FHR assessment Prenatal vitamins. Continue current HIV medication. Continue current dose of labetalol Problem list reviewed and updated. Genetic Screening discussed:  Late to care  Ultrasound discussed; fetal survey: results reviewed.  Follow up on Monday to begin fetal testing.  Marlis Edelson 04/29/2015

## 2015-04-30 LAB — CULTURE, OB URINE
Colony Count: NO GROWTH
Organism ID, Bacteria: NO GROWTH

## 2015-04-30 LAB — GLUCOSE TOLERANCE, 1 HOUR (50G) W/O FASTING: Glucose, 1 Hour GTT: 148 mg/dL — ABNORMAL HIGH (ref 70–140)

## 2015-04-30 LAB — TSH: TSH: 1.435 u[IU]/mL (ref 0.350–4.500)

## 2015-05-01 LAB — HIV 1/2 CONFIRMATION
HIV-1 antibody: POSITIVE — AB
HIV-2 Ab: NEGATIVE

## 2015-05-02 ENCOUNTER — Ambulatory Visit (INDEPENDENT_AMBULATORY_CARE_PROVIDER_SITE_OTHER): Payer: Medicaid Other | Admitting: *Deleted

## 2015-05-02 ENCOUNTER — Other Ambulatory Visit: Payer: Self-pay | Admitting: Family

## 2015-05-02 ENCOUNTER — Other Ambulatory Visit: Payer: Self-pay | Admitting: Obstetrics & Gynecology

## 2015-05-02 ENCOUNTER — Ambulatory Visit (HOSPITAL_COMMUNITY)
Admission: RE | Admit: 2015-05-02 | Discharge: 2015-05-02 | Disposition: A | Discharge status: Home / Self Care | Payer: Medicaid Other | Source: Ambulatory Visit | Attending: Obstetrics & Gynecology | Admitting: Obstetrics & Gynecology

## 2015-05-02 VITALS — BP 148/79 | HR 82

## 2015-05-02 DIAGNOSIS — O30049 Twin pregnancy, dichorionic/diamniotic, unspecified trimester: Secondary | ICD-10-CM

## 2015-05-02 DIAGNOSIS — O0993 Supervision of high risk pregnancy, unspecified, third trimester: Secondary | ICD-10-CM

## 2015-05-02 DIAGNOSIS — O10912 Unspecified pre-existing hypertension complicating pregnancy, second trimester: Secondary | ICD-10-CM | POA: Diagnosis not present

## 2015-05-02 DIAGNOSIS — O289 Unspecified abnormal findings on antenatal screening of mother: Secondary | ICD-10-CM | POA: Diagnosis not present

## 2015-05-02 DIAGNOSIS — O10919 Unspecified pre-existing hypertension complicating pregnancy, unspecified trimester: Secondary | ICD-10-CM

## 2015-05-02 DIAGNOSIS — O288 Other abnormal findings on antenatal screening of mother: Secondary | ICD-10-CM

## 2015-05-02 LAB — PRENATAL PROFILE (SOLSTAS)
Antibody Screen: NEGATIVE
BASOS ABS: 0 10*3/uL (ref 0.0–0.1)
Basophils Relative: 0 % (ref 0–1)
EOS PCT: 1 % (ref 0–5)
Eosinophils Absolute: 0.1 10*3/uL (ref 0.0–0.7)
HCT: 32.4 % — ABNORMAL LOW (ref 36.0–46.0)
HEMOGLOBIN: 10.3 g/dL — AB (ref 12.0–15.0)
HEP B S AG: NEGATIVE
HIV: REACTIVE — AB
LYMPHS PCT: 22 % (ref 12–46)
Lymphs Abs: 1.1 10*3/uL (ref 0.7–4.0)
MCH: 26.3 pg (ref 26.0–34.0)
MCHC: 31.8 g/dL (ref 30.0–36.0)
MCV: 82.9 fL (ref 78.0–100.0)
MPV: 9.1 fL (ref 8.6–12.4)
Monocytes Absolute: 0.4 10*3/uL (ref 0.1–1.0)
Monocytes Relative: 8 % (ref 3–12)
NEUTROS PCT: 69 % (ref 43–77)
Neutro Abs: 3.5 10*3/uL (ref 1.7–7.7)
Platelets: 161 10*3/uL (ref 150–400)
RBC: 3.91 MIL/uL (ref 3.87–5.11)
RDW: 15.4 % (ref 11.5–15.5)
Rh Type: POSITIVE
Rubella: 5.06 Index — ABNORMAL HIGH (ref <0.90)
WBC: 5 10*3/uL (ref 4.0–10.5)

## 2015-05-02 MED ORDER — ALBUTEROL SULFATE HFA 108 (90 BASE) MCG/ACT IN AERS: 2.0000 | INHALATION_SPRAY | Freq: Four times a day (QID) | RESPIRATORY_TRACT | Status: DC | PRN

## 2015-05-02 NOTE — Progress Notes (Signed)
Pt requested Rx for albuteral inhaler due to having a chest cold.  She reports that she has had a chest cold x3 days and sometimes has difficulty breathing especially upon exertion. Her children at home are sick as well.  Rx for albuteral inhaler e-prescribed per order from Dr. Macon LargeAnyanwu.  Pt taken to US dept for BPP today.

## 2015-05-02 NOTE — Progress Notes (Addendum)
NST performed today was reviewed and was found to be reactive for Twin B, but reassuring and not formally reactive for Twin A.  Follow up BPP was 6/8 for both (breathing seen but not sustained for Twin A); making 6/10 for Twin A and 8/10 for Twin B.  Will repeat BPP tomorrow morning during her scheduled follow up scan.

## 2015-05-02 NOTE — Addendum Note (Signed)
Addended by: Jaynie CollinsANYANWU, Josalynn Johndrow A on: 05/02/2015 05:55 PM   Modules accepted: Level of Service

## 2015-05-03 ENCOUNTER — Ambulatory Visit (HOSPITAL_COMMUNITY)
Admission: RE | Admit: 2015-05-03 | Discharge: 2015-05-03 | Disposition: A | Discharge status: Home / Self Care | Payer: Medicaid Other | Source: Ambulatory Visit | Attending: Family | Admitting: Family

## 2015-05-03 ENCOUNTER — Encounter (HOSPITAL_COMMUNITY): Payer: Self-pay

## 2015-05-03 DIAGNOSIS — Z3A32 32 weeks gestation of pregnancy: Secondary | ICD-10-CM | POA: Insufficient documentation

## 2015-05-03 DIAGNOSIS — O30043 Twin pregnancy, dichorionic/diamniotic, third trimester: Secondary | ICD-10-CM | POA: Insufficient documentation

## 2015-05-03 DIAGNOSIS — O0993 Supervision of high risk pregnancy, unspecified, third trimester: Secondary | ICD-10-CM | POA: Diagnosis present

## 2015-05-03 DIAGNOSIS — O288 Other abnormal findings on antenatal screening of mother: Secondary | ICD-10-CM | POA: Insufficient documentation

## 2015-05-03 DIAGNOSIS — O10919 Unspecified pre-existing hypertension complicating pregnancy, unspecified trimester: Secondary | ICD-10-CM | POA: Insufficient documentation

## 2015-05-03 DIAGNOSIS — O99213 Obesity complicating pregnancy, third trimester: Secondary | ICD-10-CM

## 2015-05-03 DIAGNOSIS — Z3A00 Weeks of gestation of pregnancy not specified: Secondary | ICD-10-CM | POA: Diagnosis not present

## 2015-05-03 LAB — PROTEIN, URINE, 24 HOUR
Protein, 24H Urine: 102 mg/d (ref <150)
Protein, Urine: 12 mg/dL (ref 5–24)

## 2015-05-03 LAB — CREATININE CLEARANCE, URINE, 24 HOUR
CREAT CLEAR: 89 mL/min (ref 75–115)
Creatinine, 24H Ur: 718 mg/d (ref 700–1800)
Creatinine, Urine: 84.5 mg/dL
Creatinine: 0.56 mg/dL (ref 0.50–1.10)

## 2015-05-03 LAB — CYTOLOGY - PAP
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
## 2015-05-05 ENCOUNTER — Ambulatory Visit: Payer: Medicaid Other | Admitting: Internal Medicine

## 2015-05-05 ENCOUNTER — Other Ambulatory Visit: Payer: Self-pay

## 2015-05-05 LAB — OPIATES/OPIOIDS (LC/MS-MS)
CODEINE URINE: NEGATIVE ng/mL (ref <50)
Hydrocodone: NEGATIVE ng/mL (ref <50)
Hydromorphone: 2833 ng/mL — AB (ref <50)
Morphine Urine: NEGATIVE ng/mL (ref <50)
NOROXYCODONE, UR: NEGATIVE ng/mL (ref <50)
Norhydrocodone, Ur: NEGATIVE ng/mL (ref <50)
Oxycodone, ur: NEGATIVE ng/mL (ref <50)
Oxymorphone: NEGATIVE ng/mL (ref <50)

## 2015-05-05 LAB — PRESCRIPTION MONITORING PROFILE (19 PANEL)
Amphetamine/Meth: NEGATIVE ng/mL
BARBITURATE SCREEN, URINE: NEGATIVE ng/mL
Benzodiazepine Screen, Urine: NEGATIVE ng/mL
Buprenorphine, Urine: NEGATIVE ng/mL
COCAINE METABOLITES: NEGATIVE ng/mL
Cannabinoid Scrn, Ur: NEGATIVE ng/mL
Carisoprodol, Urine: NEGATIVE ng/mL
Creatinine, Urine: 152.67 mg/dL (ref >20.0)
FENTANYL URINE: NEGATIVE ng/mL
MDMA URINE: NEGATIVE ng/mL
METHAQUALONE SCREEN (URINE): NEGATIVE ng/mL
Meperidine, Ur: NEGATIVE ng/mL
Methadone Screen, Urine: NEGATIVE ng/mL
NITRITES URINE, INITIAL: NEGATIVE ug/mL
Oxycodone Screen, Ur: NEGATIVE ng/mL
PH URINE, INITIAL: 6.1 pH (ref 4.5–8.9)
PHENCYCLIDINE, UR: NEGATIVE ng/mL
Propoxyphene: NEGATIVE ng/mL
TRAMADOL UR: NEGATIVE ng/mL
Tapentadol, urine: NEGATIVE ng/mL
Zolpidem, Urine: NEGATIVE ng/mL

## 2015-05-05 NOTE — Progress Notes (Signed)
CSW received a call from [facility] to inform CSW that they have a 2 month waiting list for patients with Medicaid and that they no longer have state funds for patients who are uninsured.  CSW attempted to contact patient to offer referral to another agency, but there was no answer.  CSW left brief message requesting a returned call, but did not leave reason for call on the message.

## 2015-05-09 ENCOUNTER — Other Ambulatory Visit: Payer: Medicaid Other

## 2015-05-23 ENCOUNTER — Other Ambulatory Visit: Payer: Medicaid Other

## 2015-05-23 ENCOUNTER — Telehealth: Payer: Self-pay | Admitting: *Deleted

## 2015-05-23 NOTE — Telephone Encounter (Addendum)
Called pt and left message for her to call me back today regarding her appt for 8/4.  I had already given that appt time of 3pm to another pt but had not entered it on the appt list. We will need to find another appt time for her.  **Pt need to come @ 0800, 0900 or 1000 on 8/4.  8/2  1500  Pt returned my call today and spoke with Elyse Hsu - registrar. She rescheduled appt to 8/8 @ 0800 stating that she cannot come to clinic until that day.

## 2015-05-24 ENCOUNTER — Telehealth: Payer: Self-pay | Admitting: *Deleted

## 2015-05-24 NOTE — Telephone Encounter (Addendum)
Patient called and stated that she needs a note stating that she can't work 8-10 hours per day. Patient states that she doesn't want to stop working just wants permission for reduced schedule.   8/3  0845  Returned pt's call and left message stating that the doctors will want to see her before a work note can be given. Her last clinic visit was on 7/11.  (Pt has had multiple cancellations and no shows) . She also needs twice weekly visits for fetal testing as previously discussed. We can see her on 8/5 @ 1000 for NST. This visit is very important. The doctor will determine if work note is appropriate at that time. Please call back.    Diane Day RNC

## 2015-05-26 ENCOUNTER — Other Ambulatory Visit: Payer: Medicaid Other

## 2015-05-30 ENCOUNTER — Other Ambulatory Visit: Payer: Self-pay | Admitting: Family

## 2015-05-30 ENCOUNTER — Other Ambulatory Visit: Payer: Medicaid Other

## 2015-05-30 DIAGNOSIS — Z3A36 36 weeks gestation of pregnancy: Secondary | ICD-10-CM

## 2015-05-30 DIAGNOSIS — Z21 Asymptomatic human immunodeficiency virus [HIV] infection status: Secondary | ICD-10-CM

## 2015-05-30 DIAGNOSIS — O09293 Supervision of pregnancy with other poor reproductive or obstetric history, third trimester: Secondary | ICD-10-CM

## 2015-05-30 DIAGNOSIS — O10913 Unspecified pre-existing hypertension complicating pregnancy, third trimester: Secondary | ICD-10-CM

## 2015-05-30 DIAGNOSIS — O34219 Maternal care for unspecified type scar from previous cesarean delivery: Secondary | ICD-10-CM

## 2015-05-30 DIAGNOSIS — O30043 Twin pregnancy, dichorionic/diamniotic, third trimester: Secondary | ICD-10-CM

## 2015-05-30 DIAGNOSIS — O99213 Obesity complicating pregnancy, third trimester: Secondary | ICD-10-CM

## 2015-05-31 ENCOUNTER — Ambulatory Visit (HOSPITAL_COMMUNITY): Payer: Medicaid Other

## 2015-06-01 ENCOUNTER — Other Ambulatory Visit: Payer: Self-pay | Admitting: Family

## 2015-06-01 ENCOUNTER — Ambulatory Visit (HOSPITAL_COMMUNITY)
Admission: RE | Admit: 2015-06-01 | Discharge: 2015-06-01 | Disposition: A | Discharge status: Home / Self Care | Payer: Medicaid Other | Source: Ambulatory Visit | Attending: Family | Admitting: Family

## 2015-06-01 DIAGNOSIS — Z21 Asymptomatic human immunodeficiency virus [HIV] infection status: Secondary | ICD-10-CM | POA: Insufficient documentation

## 2015-06-01 DIAGNOSIS — O98713 Human immunodeficiency virus [HIV] disease complicating pregnancy, third trimester: Secondary | ICD-10-CM | POA: Diagnosis not present

## 2015-06-01 DIAGNOSIS — O99213 Obesity complicating pregnancy, third trimester: Secondary | ICD-10-CM

## 2015-06-01 DIAGNOSIS — O09293 Supervision of pregnancy with other poor reproductive or obstetric history, third trimester: Secondary | ICD-10-CM

## 2015-06-01 DIAGNOSIS — Z3A36 36 weeks gestation of pregnancy: Secondary | ICD-10-CM

## 2015-06-01 DIAGNOSIS — O30043 Twin pregnancy, dichorionic/diamniotic, third trimester: Secondary | ICD-10-CM | POA: Diagnosis not present

## 2015-06-01 DIAGNOSIS — O10913 Unspecified pre-existing hypertension complicating pregnancy, third trimester: Secondary | ICD-10-CM

## 2015-06-01 DIAGNOSIS — O34219 Maternal care for unspecified type scar from previous cesarean delivery: Secondary | ICD-10-CM

## 2015-06-02 ENCOUNTER — Encounter: Payer: Self-pay | Admitting: *Deleted

## 2015-06-02 ENCOUNTER — Ambulatory Visit (INDEPENDENT_AMBULATORY_CARE_PROVIDER_SITE_OTHER): Payer: Medicaid Other | Admitting: Family Medicine

## 2015-06-02 VITALS — BP 162/91 | HR 75 | Temp 98.2°F | Wt >= 6400 oz

## 2015-06-02 DIAGNOSIS — O99213 Obesity complicating pregnancy, third trimester: Secondary | ICD-10-CM | POA: Diagnosis not present

## 2015-06-02 DIAGNOSIS — O0993 Supervision of high risk pregnancy, unspecified, third trimester: Secondary | ICD-10-CM | POA: Diagnosis present

## 2015-06-02 DIAGNOSIS — B20 Human immunodeficiency virus [HIV] disease: Secondary | ICD-10-CM | POA: Diagnosis not present

## 2015-06-02 DIAGNOSIS — O10913 Unspecified pre-existing hypertension complicating pregnancy, third trimester: Secondary | ICD-10-CM | POA: Diagnosis not present

## 2015-06-02 LAB — CBC
HCT: 30.6 % — ABNORMAL LOW (ref 36.0–46.0)
Hemoglobin: 10.4 g/dL — ABNORMAL LOW (ref 12.0–15.0)
MCH: 26.3 pg (ref 26.0–34.0)
MCHC: 34 g/dL (ref 30.0–36.0)
MCV: 77.5 fL — ABNORMAL LOW (ref 78.0–100.0)
MPV: 9.3 fL (ref 8.6–12.4)
Platelets: 163 10*3/uL (ref 150–400)
RBC: 3.95 MIL/uL (ref 3.87–5.11)
RDW: 15.6 % — ABNORMAL HIGH (ref 11.5–15.5)
WBC: 6.2 10*3/uL (ref 4.0–10.5)

## 2015-06-02 LAB — POCT URINALYSIS DIP (DEVICE)
Bilirubin Urine: NEGATIVE
Glucose, UA: NEGATIVE mg/dL
Ketones, ur: NEGATIVE mg/dL
Leukocytes, UA: NEGATIVE
Nitrite: NEGATIVE
Protein, ur: NEGATIVE mg/dL
Specific Gravity, Urine: 1.015 (ref 1.005–1.030)
Urobilinogen, UA: 0.2 mg/dL (ref 0.0–1.0)
pH: 5.5 (ref 5.0–8.0)

## 2015-06-02 MED ORDER — LABETALOL HCL 300 MG PO TABS: 300.0000 mg | ORAL_TABLET | Freq: Two times a day (BID) | ORAL | Status: DC

## 2015-06-02 NOTE — Progress Notes (Signed)
Pt had BPP 8/8 x2 yesterday - does not need NST today per Dr. Shawnie Pons.

## 2015-06-02 NOTE — Progress Notes (Signed)
Subjective:  Eileen [name] is a 29 y.o. [phone] at [redacted]w[redacted]d being seen today for ongoing prenatal care.  Patient reports no complaints.  Contractions: Not present. Eileen [name] has had limited PNC. She has not been scheduled for her C-section. Vag. Bleeding: None. Movement: Present. Denies leaking of fluid.   The following portions of the patient's history were reviewed and updated as appropriate: allergies, current medications, past family history, past medical history, past social history, past surgical history and problem list.   Objective:   Filed Vitals:   06/02/15 1449 06/02/15 1453  BP: 185/101 162/91  Pulse: 75 75  Temp: 98.2 F (36.8 C)   Weight: 442 lb 4.8 oz (200.626 kg)     Fetal Status:     Movement: Present     General:  Alert, oriented and cooperative. Patient is in no acute distress.  Skin: Skin is warm and dry. No rash noted.   Cardiovascular: Normal heart rate noted  Respiratory: Normal respiratory effort, no problems with respiration noted  Abdomen: Soft, gravid, appropriate for gestational age. Pain/Pressure: Absent     Pelvic: Vag. Bleeding: None      Extremities: Normal range of motion.  Edema: Mild pitting, slight indentation  Mental Status: Normal mood and affect. Normal behavior. Normal judgment and thought content.   Urinalysis: Urine Protein: Negative Urine Glucose: Negative  Assessment and Plan:  Pregnancy: [id number] at [redacted]w[redacted]d  1. Chronic hypertension in pregnancy, third trimester BPP yesterday was 8/8 x 2--will not do NST today BP is so elevated today, will increase her meds and check labs. - Comprehensive metabolic panel - CBC - Protein / creatinine ratio, urine - labetalol (NORMODYNE) 300 MG tablet; Take 1 tablet (300 mg total) by mouth 2 (two) times daily.  Dispense: 60 tablet; Refill: 1  2. Supervision of high-risk pregnancy, third trimester  - Culture, beta strep (group b only) - GC/Chlamydia Probe Amp  3. Maternal morbid obesity in third  trimester, antepartum  4. Human immunodeficiency virus (HIV) disease  - HIV-1 RNA ultraquant reflex to gentyp+  5. Previous Cesarean section, antepartum Needs to be scheduled at 38 wks--IV AZT will have to be done also.  Term labor symptoms and general obstetric precautions including but not limited to vaginal bleeding, contractions, leaking of fluid and fetal movement were reviewed in detail with the patient. Please refer to After Visit Summary for other counseling recommendations.  Return in 1 week (on 06/09/2015).   Reva Bores, MD

## 2015-06-02 NOTE — Progress Notes (Signed)
Edema-feet   

## 2015-06-02 NOTE — Patient Instructions (Addendum)
Cesarean Delivery  Cesarean delivery is the birth of a baby through a cut (incision) in the abdomen and womb (uterus).  LET YOUR HEALTH CARE PROVIDER KNOW ABOUT:  All medicines you are taking, including vitamins, herbs, eye drops, creams, and over-the-counter medicines.  Previous problems you or members of your family have had with the use of anesthetics.  Any blood disorders you have.  Previous surgeries you have had.  Medical conditions you have.  Any allergies you have.  Complicationsinvolving the pregnancy. RISKS AND COMPLICATIONS  Generally, this is a safe procedure. However, as with any procedure, complications can occur. Possible complications include:  Bleeding.  Infection.  Blood clots.  Injury to surrounding organs.  Problems with anesthesia.  Injury to the baby. BEFORE THE PROCEDURE   You may be given an antacid medicine to drink. This will prevent acid contents in your stomach from going into your lungs if you vomit during the surgery.  You may be given an antibiotic medicine to prevent infection. PROCEDURE   Hair may be removed from your pubic area and your lower abdomen. This is to prevent infection in the incision site.  A tube (Foley catheter) will be placed in your bladder to drain your urine from your bladder into a bag. This keeps your bladder empty during surgery.  An IV tube will be placed in your vein.  You may be given medicine to numb the lower half of your body (regional anesthetic). If you were in labor, you may have already had an epidural in place which can be used in both labor and cesarean delivery. You may possibly be given medicine to make you sleep (general anesthetic) though this is not as common.  An incision will be made in your abdomen that extends to your uterus. There are 2 basic kinds of incisions:  The horizontal (transverse) incision. Horizontal incisions are from side to side and are used for most routine cesarean  deliveries.  The vertical incision. The vertical incision is from the top of the abdomen to the bottom and is less commonly used. It is often done for women who have a serious complication (extreme prematurity) or under emergency situations.  The horizontal and vertical incisions may both be used at the same time. However, this is very uncommon.  An incision is then made in your uterus to deliver the baby.  Your baby will then be delivered.  Both incisions are then closed with absorbable stitches. AFTER THE PROCEDURE   If you were awake during the surgery, you will see your baby right away. If you were asleep, you will see your baby as soon as you are awake.  You may breastfeed your baby after surgery.  You may be able to get up and walk the same day as the surgery. If you need to stay in bed for a period of time, you will receive help to turn, cough, and take deep breaths after surgery. This helps prevent lung problems such as pneumonia.  Do not get out of bed alone the first time after surgery. You will need help getting out of bed until you are able to do this by yourself.  You may be able to shower the day after your cesarean delivery. After the bandage (dressing) is taken off the incision site, a nurse will assist you to shower if you would like help.  You will have pneumatic compression hose placed on your lower legs. This is done to prevent blood clots. When you are up   and walking regularly, they will no longer be necessary.  Do not cross your legs when you sit.  Save any blood clots that you pass. If you pass a clot while on the toilet, do not flush it. Call for the nurse. Tell the nurse if you think you are bleeding too much or passing too many clots.  You will be given medicine as needed. Let your health care providers know if you are hurting. You may also be given an antibiotic to prevent an infection.  Your IV tube will be taken out when you are drinking a reasonable  amount of fluids. The Foley catheter is taken out when you are up and walking.  If your blood type is Rh negative and your baby's blood type is Rh positive, you will be given a shot of anti-D immune globulin. This shot prevents you from having Rh problems with a future pregnancy. You should get the shot even if you had your tubes tied (tubal ligation).  If you are allowed to take the baby for a walk, place the baby in the bassinet and push it. Do not carry your baby in your arms. Document Released: 10/08/2005 Document Revised: 07/29/2013 Document Reviewed: 04/29/2013 ExitCare Patient Information 2015 [employer]. This information is not intended to replace advice given to you by your health care provider. Make sure you discuss any questions you have with your health care provider.  

## 2015-06-03 ENCOUNTER — Encounter (HOSPITAL_COMMUNITY): Payer: Self-pay | Admitting: *Deleted

## 2015-06-03 LAB — COMPREHENSIVE METABOLIC PANEL
ALK PHOS: 107 U/L (ref 33–115)
ALT: 9 U/L (ref 6–29)
AST: 16 U/L (ref 10–30)
Albumin: 2.8 g/dL — ABNORMAL LOW (ref 3.6–5.1)
BUN: 5 mg/dL — AB (ref 7–25)
CO2: 24 mmol/L (ref 20–31)
CREATININE: 0.5 mg/dL (ref 0.50–1.10)
Calcium: 8.4 mg/dL — ABNORMAL LOW (ref 8.6–10.2)
Chloride: 103 mmol/L (ref 98–110)
Glucose, Bld: 85 mg/dL (ref 65–99)
Potassium: 4.2 mmol/L (ref 3.5–5.3)
Sodium: 136 mmol/L (ref 135–146)
Total Bilirubin: 0.4 mg/dL (ref 0.2–1.2)
Total Protein: 6.3 g/dL (ref 6.1–8.1)

## 2015-06-03 LAB — GC/CHLAMYDIA PROBE AMP
CT Probe RNA: NEGATIVE
GC Probe RNA: NEGATIVE

## 2015-06-03 LAB — PROTEIN / CREATININE RATIO, URINE
Creatinine, Urine: 114.9 mg/dL
Protein Creatinine Ratio: 0.23 — ABNORMAL HIGH (ref <0.15)
Total Protein, Urine: 26 mg/dL — ABNORMAL HIGH (ref 5–24)

## 2015-06-04 LAB — CULTURE, BETA STREP (GROUP B ONLY)

## 2015-06-07 LAB — HIV-1 RNA ULTRAQUANT REFLEX TO GENTYP+
HIV 1 RNA Quant: 36 copies/mL — ABNORMAL HIGH (ref <20)
HIV-1 RNA Quant, Log: 1.56 {Log} — ABNORMAL HIGH (ref <1.30)

## 2015-06-09 ENCOUNTER — Ambulatory Visit (INDEPENDENT_AMBULATORY_CARE_PROVIDER_SITE_OTHER): Payer: Medicaid Other | Admitting: Obstetrics & Gynecology

## 2015-06-09 VITALS — BP 150/89 | HR 84 | Temp 98.6°F | Wt >= 6400 oz

## 2015-06-09 DIAGNOSIS — O0993 Supervision of high risk pregnancy, unspecified, third trimester: Secondary | ICD-10-CM | POA: Diagnosis not present

## 2015-06-09 DIAGNOSIS — O10919 Unspecified pre-existing hypertension complicating pregnancy, unspecified trimester: Secondary | ICD-10-CM

## 2015-06-09 DIAGNOSIS — O10913 Unspecified pre-existing hypertension complicating pregnancy, third trimester: Secondary | ICD-10-CM

## 2015-06-09 DIAGNOSIS — O30049 Twin pregnancy, dichorionic/diamniotic, unspecified trimester: Secondary | ICD-10-CM | POA: Diagnosis not present

## 2015-06-09 NOTE — Progress Notes (Signed)
Subjective:  Eileen [name] is a 29 y.o. [phone] at [redacted]w[redacted]d being seen today for ongoing prenatal care.  Patient reports no complaints.  Contractions: Irregular.  Vag. Bleeding: None. Movement: Present. Denies leaking of fluid.   The following portions of the patient's history were reviewed and updated as appropriate: allergies, current medications, past family history, past medical history, past social history, past surgical history and problem list.   Objective:   Filed Vitals:   06/09/15 1505  BP: 150/89  Pulse: 84  Temp: 98.6 F (37 C)  Weight: 437 lb (198.222 kg)    Fetal Status:     Movement: Present     General:  Alert, oriented and cooperative. Patient is in no acute distress.  Skin: Skin is warm and dry. No rash noted.   Cardiovascular: Normal heart rate noted  Respiratory: Normal respiratory effort, no problems with respiration noted  Abdomen: Soft, gravid, appropriate for gestational age. Pain/Pressure: Present     Pelvic: Vag. Bleeding: None     Cervical exam deferred        Extremities: Normal range of motion.  Edema: Moderate pitting, indentation subsides rapidly  Mental Status: Normal mood and affect. Normal behavior. Normal judgment and thought content.   Urinalysis:      Assessment and Plan:  Pregnancy: [id number] at [redacted]w[redacted]d  1. Supervision of high-risk pregnancy, third trimester She has her RLTCS scheduled for Monday.  Term labor symptoms and general obstetric precautions including but not limited to vaginal bleeding, contractions, leaking of fluid and fetal movement were reviewed in detail with the patient. Please refer to After Visit Summary for other counseling recommendations.  No Follow-up on file.   Allie Bossier, MD

## 2015-06-09 NOTE — Addendum Note (Signed)
Addended by: Jill Side on: 06/09/2015 05:20 PM   Modules accepted: Orders

## 2015-06-10 ENCOUNTER — Encounter (HOSPITAL_COMMUNITY): Payer: Self-pay

## 2015-06-10 ENCOUNTER — Encounter (HOSPITAL_COMMUNITY)
Admission: RE | Admit: 2015-06-10 | Discharge: 2015-06-10 | Disposition: A | Discharge status: Home / Self Care | Payer: Medicaid Other | Source: Ambulatory Visit | Attending: Family Medicine | Admitting: Family Medicine

## 2015-06-10 VITALS — BP 162/102 | HR 72 | Resp 20 | Ht 70.0 in | Wt >= 6400 oz

## 2015-06-10 DIAGNOSIS — O0993 Supervision of high risk pregnancy, unspecified, third trimester: Secondary | ICD-10-CM

## 2015-06-10 DIAGNOSIS — Z01818 Encounter for other preprocedural examination: Secondary | ICD-10-CM | POA: Insufficient documentation

## 2015-06-10 DIAGNOSIS — O34219 Maternal care for unspecified type scar from previous cesarean delivery: Secondary | ICD-10-CM

## 2015-06-10 HISTORY — DX: Hypothyroidism, unspecified: E03.9

## 2015-06-10 LAB — BASIC METABOLIC PANEL
Anion gap: 9 (ref 5–15)
BUN: 7 mg/dL (ref 6–20)
CHLORIDE: 102 mmol/L (ref 101–111)
CO2: 22 mmol/L (ref 22–32)
CREATININE: 0.68 mg/dL (ref 0.44–1.00)
Calcium: 8.6 mg/dL — ABNORMAL LOW (ref 8.9–10.3)
Glucose, Bld: 127 mg/dL — ABNORMAL HIGH (ref 65–99)
Potassium: 3.9 mmol/L (ref 3.5–5.1)
SODIUM: 133 mmol/L — AB (ref 135–145)

## 2015-06-10 LAB — CBC
HCT: 32.4 % — ABNORMAL LOW (ref 36.0–46.0)
Hemoglobin: 10.6 g/dL — ABNORMAL LOW (ref 12.0–15.0)
MCH: 26.6 pg (ref 26.0–34.0)
MCHC: 32.7 g/dL (ref 30.0–36.0)
MCV: 81.2 fL (ref 78.0–100.0)
PLATELETS: 176 10*3/uL (ref 150–400)
RBC: 3.99 MIL/uL (ref 3.87–5.11)
RDW: 15.9 % — AB (ref 11.5–15.5)
WBC: 6.2 10*3/uL (ref 4.0–10.5)

## 2015-06-10 NOTE — Patient Instructions (Addendum)
Your procedure is scheduled on:  June 13, 2015    Enter through the Main Entrance of [facility] at: 10:00 am   Pick up the phone at the desk and dial [phone].  Call this number if you have problems the morning of surgery: [phone].  Remember: Do NOT eat food: after midnight on Sunday  Do NOT drink clear liquids after:  10:45 am day of surgery  Take these medicines the morning of surgery with a SIP OF WATER:  Labetalol  Bring Inhaler with you day of surgery  Do NOT wear jewelry (body piercing), metal hair clips/bobby pins, make-up, or nail polish. Do NOT wear lotions, powders, or perfumes.  You may wear deoderant. Do NOT shave for 48 hours prior to surgery. Do NOT bring valuables to the hospital. Leave suitcase in car.  After surgery it may be brought to your room.  For patients admitted to the hospital, checkout time is 11:00 AM the day of discharge.

## 2015-06-10 NOTE — Pre-Procedure Instructions (Signed)
Patient had no orders in computer at PAT visit. Dr. Shawnie Pons was paged because patient is HIV positive and needed to come in for her AZT infusion prior to surgery. Orders received and pt instructed to be here at 10:00 am on Monday.

## 2015-06-11 ENCOUNTER — Inpatient Hospital Stay (HOSPITAL_COMMUNITY)
Admission: AD | Admit: 2015-06-11 | Discharge: 2015-06-11 | Disposition: A | Discharge status: Home / Self Care | Payer: Medicaid Other | Source: Ambulatory Visit | Attending: Obstetrics & Gynecology | Admitting: Obstetrics & Gynecology

## 2015-06-11 ENCOUNTER — Encounter (HOSPITAL_COMMUNITY): Payer: Self-pay | Admitting: *Deleted

## 2015-06-11 DIAGNOSIS — B20 Human immunodeficiency virus [HIV] disease: Secondary | ICD-10-CM | POA: Insufficient documentation

## 2015-06-11 DIAGNOSIS — Z3A37 37 weeks gestation of pregnancy: Secondary | ICD-10-CM | POA: Diagnosis not present

## 2015-06-11 DIAGNOSIS — I10 Essential (primary) hypertension: Secondary | ICD-10-CM | POA: Insufficient documentation

## 2015-06-11 DIAGNOSIS — E079 Disorder of thyroid, unspecified: Secondary | ICD-10-CM | POA: Insufficient documentation

## 2015-06-11 DIAGNOSIS — K088 Other specified disorders of teeth and supporting structures: Secondary | ICD-10-CM | POA: Diagnosis present

## 2015-06-11 DIAGNOSIS — E039 Hypothyroidism, unspecified: Secondary | ICD-10-CM | POA: Insufficient documentation

## 2015-06-11 DIAGNOSIS — E669 Obesity, unspecified: Secondary | ICD-10-CM | POA: Diagnosis not present

## 2015-06-11 DIAGNOSIS — S025XXA Fracture of tooth (traumatic), initial encounter for closed fracture: Secondary | ICD-10-CM

## 2015-06-11 DIAGNOSIS — F17210 Nicotine dependence, cigarettes, uncomplicated: Secondary | ICD-10-CM | POA: Diagnosis not present

## 2015-06-11 LAB — RPR: RPR Ser Ql: NONREACTIVE

## 2015-06-11 MED ORDER — OXYCODONE-ACETAMINOPHEN 5-325 MG PO TABS: 1.0000 | ORAL_TABLET | ORAL | Status: DC | PRN

## 2015-06-11 NOTE — MAU Note (Signed)
Patient presents at 59 weeks twin gestation with c/o her tooth breaking off last night while eating. Was instructed by her dentist to come to MAU. Scheduled for a C/S Monday. Fetuses active. Denies pain, bleeding or discharge.

## 2015-06-11 NOTE — Discharge Instructions (Signed)
Tooth Injuries °A tooth has many layers. The outside is the enamel. The enamel is the white part. Under the enamel is the dentin. Under the dentin is the pulp, the nerves, and the blood vessels. The top part is the crown. The bottom part is the root. °Three common tooth injuries include: °· Breaks (fractures) usually split the tooth into 2 or more parts. °· Shifts of the tooth at the level of the root. °· A tooth that comes out. °HOME CARE °· Minor breaks usually do not require seeing a dentist right away. °· Handle tooth fragments by the outside layer. Bring these fragments to the dentist. °· A tooth can also be loosened by injury and show no sign of a problem. See a dentist for an X-ray. The X-ray will look for problems below the gum line. °· Gently biting into gauze or a towel will help control bleeding. An exposed nerve requires a dental exam and care. Getting help right away is not needed if the pain is controlled. °· Only take medicine as told by your dentist. °· Finish all medicine as told by your dentist. °· Avoid eating solid foods and see a dentist within 24 hours. °GET HELP RIGHT AWAY IF:  °· The pain is becoming worse rather than better. °· The pain does not get better with medicine. °· You have increased puffiness (swelling) or redness in your face near the injured tooth. °MAKE SURE YOU: °· Understand these instructions. °· Will watch your condition. °· Will get help right away if you are not doing well or get worse. °Document Released: 03/28/2010 Document Revised: 12/31/2011 Document Reviewed: 03/28/2010 °ExitCare® Patient Information ©2015 [employer]. This information is not intended to replace advice given to you by your health care provider. Make sure you discuss any questions you have with your health care provider. ° °

## 2015-06-11 NOTE — MAU Provider Note (Signed)
History   CSN: [id number]  Arrival date and time: 06/11/15 1947   None     Chief Complaint  Patient presents with  . tooth broke off     HPI Patient is 29 y.o. [id number] [redacted]w[redacted]d here with complaints of tooth pain. She states that last night she was eating dinner (salad with croutons) and a piece of her tooth broke off. She was able to go to bed and then around 5am she woke up in pain. Rates pain severity as 8/10.  She has tried hot/cold packs, tylenol, and aspirin which did not improve pain. She called her dentist who stated that since patient is pregnancy and getting induced on Monday there was nothing he could do for her and her best option would be to go to hospital. Patient states she has been having pieces of her teeth chipping off during pregnancy.   She has no pregnancy concerns at this time. +FM, denies LOF, VB, contractions, vaginal discharge.   OB History    Gravida Para Term Preterm AB TAB SAB Ectopic Multiple Living   5 3 3  0 1 0 1 0 0 3    -HRC -twin pregnancy -chronic hypertension  Past Medical History  Diagnosis Date  . HIV infection   . Thyroid disease   . Genital herpes   . Syphilis     was treated  . Obesity   . HIV (human immunodeficiency virus infection)   . Anesthesia complication-postpartum     stopped breathing after spinal with c/s, in ICU  . Complication of anesthesia   . PONV (postoperative nausea and vomiting)   . Hypertension     with pregnancy  . Gestational diabetes     resolved - Hx with 1st preg but not with 2nd or this preganacy  . Hypothyroidism     Past Surgical History  Procedure Laterality Date  . Cesarean section    . Wisdom tooth extraction    . Cesarean section N/A 07/07/2013    Procedure: REPEAT CESAREAN SECTION, ;  Surgeon: Lesly Dukes, MD;  Location: [facility] ORS;  Service: Obstetrics;  Laterality: N/A;    Family History  Problem Relation Age of Onset  . Anesthesia problems Neg Hx   . Hypotension Neg Hx   . Malignant  hyperthermia Neg Hx   . Pseudochol deficiency Neg Hx   . Diabetes Maternal Grandmother     Social History  Substance Use Topics  . Smoking status: Current Every Day Smoker -- 0.50 packs/day for 12 years    Types: Cigarettes  . Smokeless tobacco: Never Used  . Alcohol Use: No    Allergies:  Allergies  Allergen Reactions  . Sulfonamide Derivatives Anaphylaxis  . Other     Steroids, unknown reaction    Prescriptions prior to admission  Medication Sig Dispense Refill Last Dose  . acetaminophen (TYLENOL) 500 MG tablet Take 1,500 mg by mouth every 4 (four) hours as needed for moderate pain (pain).   06/11/2015 at Unknown time  . albuterol (PROVENTIL HFA;VENTOLIN HFA) 108 (90 BASE) MCG/ACT inhaler Inhale 2 puffs into the lungs every 6 (six) hours as needed for wheezing or shortness of breath. 1 Inhaler 0 rescue  . Darunavir Ethanolate (PREZISTA) 800 MG tablet Take 1 tablet (800 mg total) by mouth daily. 30 tablet 5 06/10/2015 at Unknown time  . emtricitabine-tenofovir (TRUVADA) 200-300 MG per tablet Take 1 tablet by mouth daily. 30 tablet 5 06/10/2015 at Unknown time  . labetalol (NORMODYNE) 300 MG tablet Take  1 tablet (300 mg total) by mouth 2 (two) times daily. 60 tablet 1 06/11/2015 at 1000  . ritonavir (NORVIR) 100 MG TABS tablet Take 1 tablet (100 mg total) by mouth daily. 30 tablet 5 06/10/2015 at Unknown time  . Prenatal Vit-Fe Fumarate-FA (PRENATAL MULTIVITAMIN) TABS tablet Take 1 tablet by mouth daily at 12 noon. (Patient not taking: Reported on 04/14/2015) 30 tablet 5 Completed Course at Unknown time    Review of Systems  Constitutional: Negative for fever.  HENT:       Tooth pain  Respiratory: Negative for shortness of breath.   Cardiovascular: Negative for chest pain.  Gastrointestinal: Negative for abdominal pain.  Genitourinary: Negative for dysuria.  Neurological: Negative for headaches.  Also per HPI  Physical Exam   Blood pressure 136/60, pulse 71, temperature 98.1 F  (36.7 C), temperature source Oral, resp. rate 20, height  (1.778 m), weight 438 lb 8 oz (198.902 kg), last menstrual period 10/11/2014.  Physical Exam  Constitutional:  Well-appearing, obese, NAD  HENT:  Mouth/Throat: Oropharynx is clear and moist. Abnormal dentition. Dental caries present.    Cardiovascular: Normal rate.   Respiratory: Effort normal.  GI: Soft.  gravid    MAU Course  Procedures - None  MDM:  Dopplers obtained and normal FHT  Assessment and Plan  A: Patient is 29 y.o. [id number] [redacted]w[redacted]d reporting tooth pain likely secondary to fractured/chipped tooth.   P: Discharge home in stable condition - Rx for short course of percocet for pain control - Encouraged patient to brush teeth well, avoid sticky foods that may get stuck in teeth, and soft diet - Return precautions discussed - Handout given - Needs to follow up with dentistry after delivery  Caryl Ada, DO 06/11/2015, 8:21 PM  PGY-2, [facility]  OB FELLOW MAU DISCHARGE ATTESTATION  I have seen and examined this patient; I agree with above documentation in the resident's note.   Eileen [name] is a 29 y.o. [phone] reporting tooth pain after chipping her left upper pre-molar while eating salad two days ago. +FM, denies LOF, VB, contractions, vaginal discharge.  PE: BP 136/60 mmHg  Pulse 71  Temp(Src) 98.1 F (36.7 C) (Oral)  Resp 20  Ht  (1.778 m)  Wt 438 lb 8 oz (198.902 kg)  BMI 62.92 kg/m2  LMP 10/11/2014 Gen: calm comfortable, NAD Resp: normal effort, no distress Abd: gravid  ROS, labs, PMH reviewed NST reactive   Plan: - has already contacted her dentist, who said nothing to do until after patient's repeat cesarian scheduled in 2 days time - percocet for pain - counseled in mouth hygeine, and odontogenic infection return precautions  Silvano Bilis, MD 2:11 AM

## 2015-06-12 MED ORDER — ZIDOVUDINE 10 MG/ML IV SOLN
2.0000 mg/kg | Freq: Once | INTRAVENOUS | Status: AC
  Administered 2015-06-13: 396 mg via INTRAVENOUS
  Filled 2015-06-12: qty 39.6

## 2015-06-12 MED ORDER — DEXTROSE 5 % IV SOLN
3.0000 g | INTRAVENOUS | Status: AC
  Administered 2015-06-13: 3 g via INTRAVENOUS
  Filled 2015-06-12: qty 3000

## 2015-06-12 MED ORDER — ZIDOVUDINE 10 MG/ML IV SOLN
1.0000 mg/kg/h | INTRAVENOUS | Status: DC
  Administered 2015-06-13 (×2): 1 mg/kg/h via INTRAVENOUS
  Filled 2015-06-12: qty 40

## 2015-06-12 NOTE — Anesthesia Preprocedure Evaluation (Addendum)
Anesthesia Evaluation  Patient identified by MRN, date of birth, ID band Patient awake    Reviewed: Allergy & Precautions, NPO status , Patient's Chart, lab work & pertinent test results  History of Anesthesia Complications (+) PONV and history of anesthetic complications (hx of apnea after first C/S requiring ICU stay, no intubation)  Airway Mallampati: III  TM Distance: >3 FB Neck ROM: Full    Dental no notable dental hx. (+) Dental Advisory Given   Pulmonary sleep apnea (Likely OSA, symptoms, but no formal diagnosis or treatment, was instructed to get a sleep study years ago) , Current Smoker,  breath sounds clear to auscultation  Pulmonary exam normal       Cardiovascular hypertension, Normal cardiovascular examRhythm:Regular Rate:Normal     Neuro/Psych negative neurological ROS  negative psych ROS   GI/Hepatic negative GI ROS, Neg liver ROS,   Endo/Other  diabetesHypothyroidism Morbid obesity (super morbid obesity, BMI >60)  Renal/GU negative Renal ROS  negative genitourinary   Musculoskeletal negative musculoskeletal ROS (+)   Abdominal (+) + obese,   Peds negative pediatric ROS (+)  Hematology  (+) anemia , HIV,   Anesthesia Other Findings Medical Noncompliance Hx  Reproductive/Obstetrics (+) Pregnancy First C/S (2007) labor epidural was used for C/S. Had respiratory distress and desat event after surgery and went to ICU for O2 by mask and monitoring (pt thinks was related to epidural meds?).  Second C/S (2011) had wet tap with tuohy during spinal placement in OR, SAB dose was given, placed intrathecal catheter which was removed at end of case. Did not have PDPH.  This is her 4th C/S, twin gestation                           Anesthesia Physical Anesthesia Plan  ASA: IV  Anesthesia Plan: Combined Spinal and Epidural   Post-op Pain Management:    Induction:   Airway  Management Planned:   Additional Equipment:   Intra-op Plan:   Post-operative Plan:   Informed Consent: I have reviewed the patients History and Physical, chart, labs and discussed the procedure including the risks, benefits and alternatives for the proposed anesthesia with the patient or authorized representative who has indicated his/her understanding and acceptance.   Dental advisory given  Plan Discussed with: CRNA  Anesthesia Plan Comments: (2PIVs, Type and Cross, Ramp positioning, AZT infusion prior to delivery, possible ICU stay postop)       Anesthesia Quick Evaluation

## 2015-06-13 ENCOUNTER — Inpatient Hospital Stay (HOSPITAL_COMMUNITY)
Admission: RE | Admit: 2015-06-13 | Discharge: 2015-06-15 | DRG: 765 | Disposition: A | Discharge status: Home / Self Care | Payer: Medicaid Other | Source: Ambulatory Visit | Attending: Family Medicine | Admitting: Family Medicine

## 2015-06-13 ENCOUNTER — Inpatient Hospital Stay (HOSPITAL_COMMUNITY): Payer: Medicaid Other | Admitting: Anesthesiology

## 2015-06-13 ENCOUNTER — Encounter (HOSPITAL_COMMUNITY)
Admission: RE | Disposition: A | Discharge status: Home / Self Care | Payer: Self-pay | Source: Ambulatory Visit | Attending: Family Medicine

## 2015-06-13 ENCOUNTER — Encounter (HOSPITAL_COMMUNITY): Payer: Self-pay | Admitting: *Deleted

## 2015-06-13 DIAGNOSIS — O30043 Twin pregnancy, dichorionic/diamniotic, third trimester: Secondary | ICD-10-CM | POA: Diagnosis present

## 2015-06-13 DIAGNOSIS — O99334 Smoking (tobacco) complicating childbirth: Secondary | ICD-10-CM | POA: Diagnosis present

## 2015-06-13 DIAGNOSIS — O3421 Maternal care for scar from previous cesarean delivery: Secondary | ICD-10-CM | POA: Diagnosis present

## 2015-06-13 DIAGNOSIS — Z302 Encounter for sterilization: Secondary | ICD-10-CM | POA: Diagnosis not present

## 2015-06-13 DIAGNOSIS — Z833 Family history of diabetes mellitus: Secondary | ICD-10-CM

## 2015-06-13 DIAGNOSIS — O9872 Human immunodeficiency virus [HIV] disease complicating childbirth: Secondary | ICD-10-CM | POA: Diagnosis present

## 2015-06-13 DIAGNOSIS — R0989 Other specified symptoms and signs involving the circulatory and respiratory systems: Secondary | ICD-10-CM | POA: Diagnosis present

## 2015-06-13 DIAGNOSIS — F17210 Nicotine dependence, cigarettes, uncomplicated: Secondary | ICD-10-CM | POA: Diagnosis present

## 2015-06-13 DIAGNOSIS — O99214 Obesity complicating childbirth: Secondary | ICD-10-CM | POA: Diagnosis not present

## 2015-06-13 DIAGNOSIS — Z3A38 38 weeks gestation of pregnancy: Secondary | ICD-10-CM | POA: Diagnosis present

## 2015-06-13 DIAGNOSIS — Z98891 History of uterine scar from previous surgery: Secondary | ICD-10-CM

## 2015-06-13 DIAGNOSIS — O10919 Unspecified pre-existing hypertension complicating pregnancy, unspecified trimester: Secondary | ICD-10-CM

## 2015-06-13 DIAGNOSIS — O321XX2 Maternal care for breech presentation, fetus 2: Secondary | ICD-10-CM | POA: Diagnosis present

## 2015-06-13 DIAGNOSIS — Z8632 Personal history of gestational diabetes: Secondary | ICD-10-CM | POA: Diagnosis not present

## 2015-06-13 DIAGNOSIS — Z21 Asymptomatic human immunodeficiency virus [HIV] infection status: Secondary | ICD-10-CM | POA: Diagnosis present

## 2015-06-13 DIAGNOSIS — Z6841 Body Mass Index (BMI) 40.0 and over, adult: Secondary | ICD-10-CM

## 2015-06-13 DIAGNOSIS — O99210 Obesity complicating pregnancy, unspecified trimester: Secondary | ICD-10-CM | POA: Diagnosis present

## 2015-06-13 DIAGNOSIS — O34219 Maternal care for unspecified type scar from previous cesarean delivery: Secondary | ICD-10-CM

## 2015-06-13 DIAGNOSIS — O1092 Unspecified pre-existing hypertension complicating childbirth: Secondary | ICD-10-CM | POA: Diagnosis present

## 2015-06-13 DIAGNOSIS — O321XX1 Maternal care for breech presentation, fetus 1: Secondary | ICD-10-CM | POA: Diagnosis present

## 2015-06-13 LAB — PREPARE RBC (CROSSMATCH)

## 2015-06-13 SURGERY — Surgical Case
Anesthesia: Regional | Site: Abdomen | Laterality: Bilateral

## 2015-06-13 MED ORDER — ZOLPIDEM TARTRATE 5 MG PO TABS: 5.0000 mg | ORAL_TABLET | Freq: Every evening | ORAL | Status: DC | PRN

## 2015-06-13 MED ORDER — TETANUS-DIPHTH-ACELL PERTUSSIS 5-2.5-18.5 LF-MCG/0.5 IM SUSP: 0.5000 mL | Freq: Once | INTRAMUSCULAR | Status: DC

## 2015-06-13 MED ORDER — KETOROLAC TROMETHAMINE 30 MG/ML IJ SOLN: 30.0000 mg | Freq: Four times a day (QID) | INTRAMUSCULAR | Status: AC | PRN

## 2015-06-13 MED ORDER — ONDANSETRON HCL 4 MG/2ML IJ SOLN
INTRAMUSCULAR | Status: DC | PRN
  Administered 2015-06-13: 4 mg via INTRAVENOUS

## 2015-06-13 MED ORDER — PHENYLEPHRINE 8 MG IN D5W 100 ML (0.08MG/ML) PREMIX OPTIME
INJECTION | INTRAVENOUS | Status: DC | PRN
  Administered 2015-06-13: 60 ug/min via INTRAVENOUS

## 2015-06-13 MED ORDER — KETOROLAC TROMETHAMINE 30 MG/ML IJ SOLN
INTRAMUSCULAR | Status: AC
  Filled 2015-06-13: qty 1

## 2015-06-13 MED ORDER — FENTANYL CITRATE (PF) 100 MCG/2ML IJ SOLN
INTRAMUSCULAR | Status: AC
  Filled 2015-06-13: qty 4

## 2015-06-13 MED ORDER — LACTATED RINGERS IV SOLN
INTRAVENOUS | Status: DC
  Administered 2015-06-13: 15:00:00 via INTRAVENOUS

## 2015-06-13 MED ORDER — SCOPOLAMINE 1 MG/3DAYS TD PT72
MEDICATED_PATCH | TRANSDERMAL | Status: AC
  Filled 2015-06-13: qty 1

## 2015-06-13 MED ORDER — DIPHENHYDRAMINE HCL 50 MG/ML IJ SOLN: 12.5000 mg | Freq: Four times a day (QID) | INTRAMUSCULAR | Status: DC | PRN

## 2015-06-13 MED ORDER — DIPHENHYDRAMINE HCL 12.5 MG/5ML PO ELIX
12.5000 mg | ORAL_SOLUTION | Freq: Four times a day (QID) | ORAL | Status: DC | PRN
  Filled 2015-06-13: qty 5

## 2015-06-13 MED ORDER — FENTANYL CITRATE (PF) 100 MCG/2ML IJ SOLN
INTRAMUSCULAR | Status: DC | PRN
  Administered 2015-06-13 (×2): 50 ug via INTRAVENOUS

## 2015-06-13 MED ORDER — SIMETHICONE 80 MG PO CHEW
80.0000 mg | CHEWABLE_TABLET | ORAL | Status: DC
  Administered 2015-06-14 – 2015-06-15 (×2): 80 mg via ORAL
  Filled 2015-06-13 (×2): qty 1

## 2015-06-13 MED ORDER — OXYTOCIN 10 UNIT/ML IJ SOLN
40.0000 [IU] | INTRAVENOUS | Status: DC | PRN
  Administered 2015-06-13: 40 [IU] via INTRAVENOUS

## 2015-06-13 MED ORDER — OXYCODONE-ACETAMINOPHEN 5-325 MG PO TABS
2.0000 | ORAL_TABLET | ORAL | Status: DC | PRN
  Administered 2015-06-14 – 2015-06-15 (×7): 2 via ORAL
  Filled 2015-06-13 (×7): qty 2

## 2015-06-13 MED ORDER — LACTATED RINGERS IV SOLN
INTRAVENOUS | Status: DC
  Administered 2015-06-13 (×4): via INTRAVENOUS

## 2015-06-13 MED ORDER — FENTANYL CITRATE (PF) 100 MCG/2ML IJ SOLN: 25.0000 ug | INTRAMUSCULAR | Status: DC | PRN

## 2015-06-13 MED ORDER — PHENYLEPHRINE 8 MG IN D5W 100 ML (0.08MG/ML) PREMIX OPTIME
INJECTION | INTRAVENOUS | Status: AC
  Filled 2015-06-13: qty 100

## 2015-06-13 MED ORDER — SODIUM CHLORIDE 0.9 % IV SOLN
INTRAVENOUS | Status: DC | PRN
  Administered 2015-06-13: 11:00:00 via INTRAVENOUS

## 2015-06-13 MED ORDER — LACTATED RINGERS IV SOLN
INTRAVENOUS | Status: DC
Start: 2015-06-13 — End: 2015-06-15
  Administered 2015-06-13: 23:00:00 via INTRAVENOUS

## 2015-06-13 MED ORDER — SIMETHICONE 80 MG PO CHEW
80.0000 mg | CHEWABLE_TABLET | Freq: Three times a day (TID) | ORAL | Status: DC
  Administered 2015-06-14 – 2015-06-15 (×5): 80 mg via ORAL
  Filled 2015-06-13 (×5): qty 1

## 2015-06-13 MED ORDER — OXYCODONE-ACETAMINOPHEN 5-325 MG PO TABS: 1.0000 | ORAL_TABLET | ORAL | Status: DC | PRN

## 2015-06-13 MED ORDER — SENNOSIDES-DOCUSATE SODIUM 8.6-50 MG PO TABS
2.0000 | ORAL_TABLET | ORAL | Status: DC
  Administered 2015-06-14 – 2015-06-15 (×2): 2 via ORAL
  Filled 2015-06-13 (×2): qty 2

## 2015-06-13 MED ORDER — DIPHENHYDRAMINE HCL 25 MG PO CAPS: 25.0000 mg | ORAL_CAPSULE | Freq: Four times a day (QID) | ORAL | Status: DC | PRN

## 2015-06-13 MED ORDER — NALOXONE HCL 0.4 MG/ML IJ SOLN: 0.4000 mg | INTRAMUSCULAR | Status: DC | PRN

## 2015-06-13 MED ORDER — DIBUCAINE 1 % RE OINT: 1.0000 "application " | TOPICAL_OINTMENT | RECTAL | Status: DC | PRN

## 2015-06-13 MED ORDER — WITCH HAZEL-GLYCERIN EX PADS: 1.0000 "application " | MEDICATED_PAD | CUTANEOUS | Status: DC | PRN

## 2015-06-13 MED ORDER — OXYTOCIN 10 UNIT/ML IJ SOLN
INTRAMUSCULAR | Status: AC
Start: 2015-06-13 — End: 2015-06-13
  Filled 2015-06-13: qty 4

## 2015-06-13 MED ORDER — SCOPOLAMINE 1 MG/3DAYS TD PT72: 1.0000 | MEDICATED_PATCH | Freq: Once | TRANSDERMAL | Status: DC

## 2015-06-13 MED ORDER — MENTHOL 3 MG MT LOZG: 1.0000 | LOZENGE | OROMUCOSAL | Status: DC | PRN

## 2015-06-13 MED ORDER — SIMETHICONE 80 MG PO CHEW
80.0000 mg | CHEWABLE_TABLET | ORAL | Status: DC | PRN
  Filled 2015-06-13: qty 1

## 2015-06-13 MED ORDER — ONDANSETRON HCL 4 MG/2ML IJ SOLN
INTRAMUSCULAR | Status: AC
  Filled 2015-06-13: qty 2

## 2015-06-13 MED ORDER — BUPIVACAINE HCL (PF) 0.5 % IJ SOLN
INTRAMUSCULAR | Status: DC | PRN
  Administered 2015-06-13: 30 mL

## 2015-06-13 MED ORDER — IBUPROFEN 600 MG PO TABS
600.0000 mg | ORAL_TABLET | Freq: Four times a day (QID) | ORAL | Status: DC
  Administered 2015-06-14 – 2015-06-15 (×7): 600 mg via ORAL
  Filled 2015-06-13 (×7): qty 1

## 2015-06-13 MED ORDER — LACTATED RINGERS IV SOLN: Freq: Once | INTRAVENOUS | Status: DC

## 2015-06-13 MED ORDER — SODIUM CHLORIDE 0.9 % IJ SOLN: 9.0000 mL | INTRAMUSCULAR | Status: DC | PRN

## 2015-06-13 MED ORDER — PRENATAL MULTIVITAMIN CH
1.0000 | ORAL_TABLET | Freq: Every day | ORAL | Status: DC
  Administered 2015-06-14 – 2015-06-15 (×2): 1 via ORAL
  Filled 2015-06-13 (×2): qty 1

## 2015-06-13 MED ORDER — KETOROLAC TROMETHAMINE 30 MG/ML IJ SOLN
30.0000 mg | Freq: Four times a day (QID) | INTRAMUSCULAR | Status: AC | PRN
  Administered 2015-06-13: 30 mg via INTRAMUSCULAR

## 2015-06-13 MED ORDER — ONDANSETRON HCL 4 MG/2ML IJ SOLN: 4.0000 mg | Freq: Once | INTRAMUSCULAR | Status: DC | PRN

## 2015-06-13 MED ORDER — ONDANSETRON HCL 4 MG/2ML IJ SOLN: 4.0000 mg | Freq: Four times a day (QID) | INTRAMUSCULAR | Status: DC | PRN

## 2015-06-13 MED ORDER — SCOPOLAMINE 1 MG/3DAYS TD PT72
1.0000 | MEDICATED_PATCH | Freq: Once | TRANSDERMAL | Status: DC
  Administered 2015-06-13: 1.5 mg via TRANSDERMAL

## 2015-06-13 MED ORDER — BUPIVACAINE IN DEXTROSE 0.75-8.25 % IT SOLN
INTRATHECAL | Status: DC | PRN
  Administered 2015-06-13: 1.6 mL via INTRATHECAL

## 2015-06-13 MED ORDER — DEXTROSE 5 % IV SOLN
INTRAVENOUS | Status: AC
  Filled 2015-06-13: qty 3000

## 2015-06-13 MED ORDER — BUPIVACAINE HCL (PF) 0.5 % IJ SOLN
INTRAMUSCULAR | Status: AC
Start: 2015-06-13 — End: 2015-06-13
  Filled 2015-06-13: qty 30

## 2015-06-13 MED ORDER — LANOLIN HYDROUS EX OINT: 1.0000 "application " | TOPICAL_OINTMENT | CUTANEOUS | Status: DC | PRN

## 2015-06-13 MED ORDER — HYDROMORPHONE 0.3 MG/ML IV SOLN
INTRAVENOUS | Status: DC
  Administered 2015-06-13: 19:00:00 via INTRAVENOUS
  Administered 2015-06-14: 3.9 mg via INTRAVENOUS
  Administered 2015-06-14: 01:00:00 via INTRAVENOUS
  Filled 2015-06-13 (×2): qty 25

## 2015-06-13 MED ORDER — ACETAMINOPHEN 325 MG PO TABS: 650.0000 mg | ORAL_TABLET | ORAL | Status: DC | PRN

## 2015-06-13 MED ORDER — MEPERIDINE HCL 25 MG/ML IJ SOLN: 6.2500 mg | INTRAMUSCULAR | Status: DC | PRN

## 2015-06-13 MED ORDER — OXYTOCIN 40 UNITS IN LACTATED RINGERS INFUSION - SIMPLE MED: 62.5000 mL/h | INTRAVENOUS | Status: AC

## 2015-06-13 MED ORDER — 0.9 % SODIUM CHLORIDE (POUR BTL) OPTIME
TOPICAL | Status: DC | PRN
  Administered 2015-06-13: 1000 mL

## 2015-06-13 SURGICAL SUPPLY — 46 items
APL SKNCLS STERI-STRIP NONHPOA (GAUZE/BANDAGES/DRESSINGS) ×1
BENZOIN TINCTURE PRP APPL 2/3 (GAUZE/BANDAGES/DRESSINGS) ×3 IMPLANT
CATH ROBINSON RED A/P 16FR (CATHETERS) IMPLANT
CLAMP CORD UMBIL (MISCELLANEOUS) IMPLANT
CLIP FILSHIE TUBAL LIGA STRL (Clip) ×3 IMPLANT
CLOSURE WOUND 1/2 X4 (GAUZE/BANDAGES/DRESSINGS) ×1
CLOTH BEACON ORANGE TIMEOUT ST (SAFETY) ×3 IMPLANT
DRAPE SHEET LG 3/4 BI-LAMINATE (DRAPES) IMPLANT
DRSG OPSITE POSTOP 4X10 (GAUZE/BANDAGES/DRESSINGS) ×3 IMPLANT
DURAPREP 26ML APPLICATOR (WOUND CARE) ×3 IMPLANT
ELECT REM PT RETURN 9FT ADLT (ELECTROSURGICAL) ×3
ELECTRODE REM PT RTRN 9FT ADLT (ELECTROSURGICAL) ×1 IMPLANT
EXTRACTOR VACUUM M CUP 4 TUBE (SUCTIONS) IMPLANT
EXTRACTOR VACUUM M CUP 4' TUBE (SUCTIONS)
GLOVE BIOGEL PI IND STRL 7.0 (GLOVE) IMPLANT
GLOVE BIOGEL PI IND STRL 7.5 (GLOVE) ×1 IMPLANT
GLOVE BIOGEL PI INDICATOR 7.0 (GLOVE) ×12
GLOVE BIOGEL PI INDICATOR 7.5 (GLOVE) ×2
GLOVE ECLIPSE 7.5 STRL STRAW (GLOVE) ×5 IMPLANT
GOWN STRL REUS W/TWL LRG LVL3 (GOWN DISPOSABLE) ×9 IMPLANT
KIT ABG SYR 3ML LUER SLIP (SYRINGE) IMPLANT
NDL HYPO 25X5/8 SAFETYGLIDE (NEEDLE) IMPLANT
NEEDLE HYPO 25X5/8 SAFETYGLIDE (NEEDLE) IMPLANT
NS IRRIG 1000ML POUR BTL (IV SOLUTION) ×3 IMPLANT
PACK C SECTION WH (CUSTOM PROCEDURE TRAY) ×3 IMPLANT
PAD OB MATERNITY 4.3X12.25 (PERSONAL CARE ITEMS) ×3 IMPLANT
RTRCTR C-SECT PINK 25CM LRG (MISCELLANEOUS) IMPLANT
RTRCTR C-SECT PINK 34CM XLRG (MISCELLANEOUS) ×2 IMPLANT
SPONGE LAP 18X18 X RAY DECT (DISPOSABLE) ×4 IMPLANT
STRIP CLOSURE SKIN 1/2X4 (GAUZE/BANDAGES/DRESSINGS) ×2 IMPLANT
SUT MNCRL 0 VIOLET CTX 36 (SUTURE) IMPLANT
SUT MONOCRYL 0 CTX 36 (SUTURE)
SUT PDS AB 0 CTX 60 (SUTURE) ×2 IMPLANT
SUT PLAIN 2 0 XLH (SUTURE) ×2 IMPLANT
SUT VIC AB 0 CT1 18XCR BRD8 (SUTURE) IMPLANT
SUT VIC AB 0 CT1 27 (SUTURE) ×3
SUT VIC AB 0 CT1 27XBRD ANBCTR (SUTURE) IMPLANT
SUT VIC AB 0 CT1 36 (SUTURE) ×2 IMPLANT
SUT VIC AB 0 CT1 8-18 (SUTURE) ×3
SUT VIC AB 0 CTX 36 (SUTURE) ×15
SUT VIC AB 0 CTX36XBRD ANBCTRL (SUTURE) ×3 IMPLANT
SUT VIC AB 2-0 CT1 27 (SUTURE) ×3
SUT VIC AB 2-0 CT1 TAPERPNT 27 (SUTURE) ×1 IMPLANT
SUT VIC AB 4-0 KS 27 (SUTURE) ×3 IMPLANT
TOWEL OR 17X24 6PK STRL BLUE (TOWEL DISPOSABLE) ×3 IMPLANT
TRAY FOLEY CATH SILVER 14FR (SET/KITS/TRAYS/PACK) ×3 IMPLANT

## 2015-06-13 NOTE — Anesthesia Postprocedure Evaluation (Signed)
  Anesthesia Post-op Note  Patient: Eileen [name]  Procedure(s) Performed: Procedure(s) (LRB): REPEAT CESAREAN SECTION WITH BILATERAL TUBAL LIGATION (Bilateral)  Patient Location: PACU  Anesthesia Type: CSE  Level of Consciousness: awake and alert   Airway and Oxygen Therapy: Patient Spontanous Breathing  Post-op Pain: mild  Post-op Assessment: Post-op Vital signs reviewed, Patient's Cardiovascular Status Stable, Respiratory Function Stable, Patent Airway and No signs of Nausea or vomiting  Last Vitals:  Filed Vitals:   06/13/15 1745  BP: 142/82  Pulse: 81  Temp:   Resp: 18    Post-op Vital Signs: stable   Complications: No apparent anesthesia complications

## 2015-06-13 NOTE — Anesthesia Procedure Notes (Signed)
Epidural Patient location during procedure: OB  Staffing Performed by: anesthesiologist   Preanesthetic Checklist Completed: patient identified, site marked, surgical consent, pre-op evaluation, timeout performed, IV checked, risks and benefits discussed and monitors and equipment checked  Epidural Patient position: sitting Prep: ChloraPrep, site prepped and draped and 2 preps with chloraprep, sterile technique observed throughout procedure Patient monitoring: continuous pulse ox and blood pressure Approach: midline Location: L3-L4 Injection technique: LOR saline  Needle:  Needle type: Tuohy  Needle gauge: 17 G Needle length: 9 cm and 9 Needle insertion depth: 9 cm Catheter type: closed end flexible Catheter size: 19 Gauge Catheter at skin depth: 15 cm Test dose: negative  Assessment Events: blood not aspirated, injection not painful, no injection resistance, negative IV test and no paresthesia  Additional Notes Patient identified. Risks/Benefits/Options discussed with patient including but not limited to bleeding, infection, nerve damage, paralysis, failed block, incomplete pain control, headache, blood pressure changes, nausea, vomiting, reactions to medication both or allergic, itching and postpartum back pain. Confirmed with bedside nurse the patient's most recent platelet count. Confirmed with patient that they are not currently taking any anticoagulation, have any bleeding history or any family history of bleeding disorders. Patient expressed understanding and wished to proceed. All questions were answered. Sterile technique was used throughout the entire procedure. Please see nursing notes for vital signs.   This was a CSE. After LOR was obtained on first pass, a 27ga spinal needle was advanced and clear CSF return. Spinal dose given through this needle and then withdrawn. Epidural catheter then threaded through tuohy easily and aspiration negative for heme or CSF. 3ml of 2%  lidocaine with epi was given through the epidural catheter and negative. Patient tolerated procedure well.

## 2015-06-13 NOTE — H&P (Signed)
Faculty Practice H&P  Eileen [name] is a 29 y.o. female [phone] with IUP at [redacted]w[redacted]d presenting for repeat cesarean section for elective repeat. Pregnancy was been complicated by Chronic hypertension, HIV, previous cesarean section x 3, dichorionic/diamniotic twins with weight concordance, class III morbid obesity with BMI of 62.    Pt states she has been having no contractions, no vaginal bleeding, intact membranes, with normal fetal movement.     Prenatal Course Source of Care: [facility]  with onset of care at [redacted]w[redacted]d Pregnancy complications or risks: Patient Active Problem List   Diagnosis Date Noted  . Maternal morbid obesity in third trimester, antepartum   . Chronic hypertension during pregnancy, antepartum 05/02/2015  . Supervision of high-risk pregnancy 04/29/2015  . Previous cesarean delivery affecting pregnancy, antepartum 04/29/2015  . Dichorionic diamniotic twin pregnancy, antepartum   . HYPERTENSION 12/06/2006  . Morbid obesity 11/12/2006  . Human immunodeficiency virus (HIV) disease 11/06/2005   She desires to bilateral tubal ligation.  She plans to plans to bottle feed  Prenatal labs and studies: ABO, Rh: --/--/O POS (08/19 1505) Antibody: NEG (08/19 1505) Rubella:   RPR: Non Reactive (08/19 1505)  HBsAg: NEGATIVE (07/08 1103)  HIV: REACTIVE (07/08 1103)  GBS:    1 hr Glucola 148, she never follow up for 3hr GTT. Genetic screeningnot done as she presented too late for care Anatomy US normal  Past Medical History:  Past Medical History  Diagnosis Date  . HIV infection   . Thyroid disease   . Genital herpes   . Syphilis     was treated  . Obesity   . HIV (human immunodeficiency virus infection)   . Anesthesia complication-postpartum     stopped breathing after spinal with c/s, in ICU  . Complication of anesthesia   . PONV (postoperative nausea and vomiting)   . Hypertension     with pregnancy  . Gestational diabetes     resolved - Hx with 1st preg but not  with 2nd or this preganacy  . Hypothyroidism     Past Surgical History:  Past Surgical History  Procedure Laterality Date  . Cesarean section    . Wisdom tooth extraction    . Cesarean section N/A 07/07/2013    Procedure: REPEAT CESAREAN SECTION, ;  Surgeon: Lesly Dukes, MD;  Location: [facility];  Service: Obstetrics;  Laterality: N/A;    Obstetrical History:  OB History    Gravida Para Term Preterm AB TAB SAB Ectopic Multiple Living   5 3 3  0 1 0 1 0 0 3       Social History:  Social History   Social History  . Marital Status: Married    Spouse Name: N/A  . Number of Children: N/A  . Years of Education: N/A   Social History Main Topics  . Smoking status: Current Every Day Smoker -- 0.50 packs/day for 12 years    Types: Cigarettes  . Smokeless tobacco: Never Used  . Alcohol Use: No  . Drug Use: No  . Sexual Activity:    Partners: Male    Pharmacist, hospital Protection: None     Comment: pt. declined condoms. pt married 08-2011. Husband is (-), refuses to use condoms..  Pregnant   Other Topics Concern  . None   Social History Narrative    Family History:  Family History  Problem Relation Age of Onset  . Anesthesia problems Neg Hx   . Hypotension Neg Hx   . Malignant hyperthermia Neg  Hx   . Pseudochol deficiency Neg Hx   . Diabetes Maternal Grandmother     Medications:  Prenatal vitamins,  Current Facility-Administered Medications  Medication Dose Route Frequency Provider Last Rate Last Dose  . ceFAZolin (ANCEF) 3 g in dextrose 5 % 50 mL IVPB  3 g Intravenous On Call to OR Levie Heritage, DO      . lactated ringers infusion   Intravenous Continuous Reva Bores, MD      . lactated ringers infusion   Intravenous Once Cristela Blue, MD      . lactated ringers infusion   Intravenous Continuous Cristela Blue, MD      . zidovudine (RETROVIR) 400 mg in dextrose 5 % 100 mL (4 mg/mL) infusion  1 mg/kg/hr Intravenous Continuous Reva Bores, MD 49.6 mL/hr at 06/13/15  1140 1 mg/kg/hr at 06/13/15 1140    Allergies:  Allergies  Allergen Reactions  . Sulfonamide Derivatives Anaphylaxis  . Other     Steroids, unknown reaction    Review of Systems: - negative  Physical Exam: Blood pressure 164/99, pulse 65, temperature 98.1 F (36.7 C), temperature source Oral, resp. rate 20, last menstrual period 10/11/2014, SpO2 97 %. GENERAL: Well-developed, well-nourished female in no acute distress.  LUNGS: Clear to auscultation bilaterally.  HEART: Regular rate and rhythm. ABDOMEN: Soft, nontender, nondistended, gravid. EFW 5#2oz, 5#15oz by [place] EXTREMITIES: Nontender, no edema, 2+ distal pulses. Cervical Exam: none FHT:  Baseline rate 135/150 bpm      Pertinent Labs/Studies:  HIV quat of 36.  Assessment : Eileen [name] is a 29 y.o. [phone] at [redacted]w[redacted]d being admitted for cesarean section secondary to elective repeat  Plan: The risks of cesarean section discussed with the patient included but were not limited to: bleeding which may require transfusion or reoperation; infection which may require antibiotics; injury to bowel, bladder, ureters or other surrounding organs; injury to the fetus; need for additional procedures including hysterectomy in the event of a life-threatening hemorrhage; placental abnormalities wth subsequent pregnancies, incisional problems, thromboembolic phenomenon and other postoperative/anesthesia complications. The patient concurred with the proposed plan, giving informed written consent for the procedure.   Patient has been NPO since midnight and will remain NPO for procedure.  Preoperative prophylactic Ancef ordered on call to the OR.    Levie Heritage, DO 06/13/2015, 12:56 PM

## 2015-06-13 NOTE — Consult Note (Signed)
Neonatology Note:   Attendance at C-section:    I was asked by Dr. Adrian Blackwater to attend this repeat C/S at 38 1/7 weeks for di-di twin gestation. The mother is a G5P3A1 O pos, HIV positive, GBS neg with morbid obesity, cigarette smoking, and chronic HTN. 1-hour GTT was abnormal, 3-hour follow-up testing was not obtained. Late PNC. ROM at delivery, fluid clear.  Twin A, a girl, was vigorous with good spontaneous cry and tone. Needed only minimal bulb suctioning. Ap 9/9. Lungs clear to ausc in DR. To CN to care of Pediatrician.  Twin B, a boy, was vigorous with good spontaneous cry and tone. Needed only minimal bulb suctioning. Ap 9/9. Lungs clear to ausc in DR. To CN to care of Pediatrician.    Doretha Sou, MD

## 2015-06-13 NOTE — Op Note (Signed)
[facility] PROCEDURE DATE: 06/13/2015  PREOPERATIVE DIAGNOSIS: Intrauterine pregnancy at  [redacted]w[redacted]d weeks gestation; previous uterine incision kerr x3 or greater, HIV, DI/DI twins, Chronic Hypertension, undesired fertility.  POSTOPERATIVE DIAGNOSIS: The same  PROCEDURE: Repeat Low Transverse Cesarean Section with bilateral tubal ligation with Filshie Clip   SURGEON:  Dr. Candelaria Celeste  ASSISTANT: Dr Erin Fulling  INDICATIONS: Eileen [name] is a 29 y.o. [id number] at [redacted]w[redacted]d scheduled for cesarean section secondary to previous uterine incision kerr x3 or greater and Di/Di twins, HIV.  The risks of cesarean section discussed with the patient included but were not limited to: bleeding which may require transfusion or reoperation; infection which may require antibiotics; injury to bowel, bladder, ureters or other surrounding organs; injury to the fetus; need for additional procedures including hysterectomy in the event of a life-threatening hemorrhage; placental abnormalities wth subsequent pregnancies, incisional problems, thromboembolic phenomenon and other postoperative/anesthesia complications. The patient concurred with the proposed plan, giving informed written consent for the procedure.    FINDINGS:  Viable Female in breech presentation.  Apgars 9 and 9, weight, 5 pounds and 13 ounces.  Clear amniotic fluid.  Viable female in transverse presentation.  Apgars 9 and 9, weight, 5 pounds and 13 ounces.  Clear amniotic fluid.  Intact placenta, three vessel cord.  Normal uterus, fallopian tubes and ovaries bilaterally.  ANESTHESIA:    Spinal INTRAVENOUS FLUIDS:3862 ml ESTIMATED BLOOD LOSS: 1150 ml URINE OUTPUT:  650 ml SPECIMENS: Placenta sent to pathology COMPLICATIONS: None immediate  PROCEDURE IN DETAIL:  The patient received intravenous antibiotics and had sequential compression devices applied to her lower extremities while in the preoperative area.  She was then taken to the operating room  where spinal anesthesia was administered and epidural anesthesia was dosed up to surgical level and was found to be adequate. She was then placed in a dorsal supine position with a leftward tilt, and prepped and draped in a sterile manner.  A foley catheter was placed into her bladder and attached to constant gravity, which drained clear fluid throughout.  After an adequate timeout was performed, a Joel-Cohen skin incision (3cm above original Pfannensteil incision) was made with scalpel and carried through to the underlying layer of fascia. The fascia was incised in the midline and this incision was extended bilaterally using the Mayo scissors. Kocher clamps were applied to the superior aspect of the fascial incision and the underlying rectus muscles were dissected off with cautery.  The rectus muscles were separated in the midline bluntly and the peritoneum was grasped with allis clamps and incised with Metzenbaum scissors.  The rectus muscles were further separated.  The omentum was densely adhesed to the anterior abdominal wall and the bladder was adhesed on the anterior uterine wall.  A bladder blade was placed and a bladder flap was created.  The bladder blade was then removed and replaced to incorporate the bladder flap.  On inspection of the lower uterine segment, a 2cm uterine window was evident in the left side of the previous hysterotomy.  The lower uterine segment was incised to the level of the amniotic sac.  A amniotomy was then preformed, revealing clear fluid. The bladder blade was removed. The baby was then delivered breech.  After suction of the mouth and nose, the baby was left on the operating table with the cord attached for a minute.  An amniotomy for Baby B was then preformed, revealing his transverse position.  His feet were grasped and he was also delivered breech.  Mouth and nose were suction and delayed cord clamping occurred with this Baby as well.  After minute past the cords were  clamped, cut, and the infants were delivered to the awaiting neonatal team. Cord blood was obtained. The cord clamp was placed on Baby A's cord, while that cord for baby B was left unclamped. Uterine massage was then administered and the placenta delivered intact with three-vessel cord. The uterus was then cleared of clot and debris.  The bladder blade was replaced. Due to the thinness of the lower uterine segment, there was a extension of the hysterotomy in the middle of the incision that extended down to the bladder. The hysterotomy was closed with 0 Vicryl in a running locked fashion. The hysterotomy was reinforced corners, as well as in the area of the extension. Overall, excellent hemostasis was noted.  Attention was then turned to the fallopian tubes.  A Filshie clip was placed on both tubes, about 2 cm from the cornua, with care given to incorporate the underlying mesosalpinx on both sides, allowing for bilateral tubal sterilization.  Hemostasis was confirmed on all surfaces.  Due to the adhesion of the omentum to the anterior abdominal wall, there was a potential space which would make the patient prone to an internal hernia. The section of omentum was grasped with 2 Kelly's on both the caudal and cephalad end. The omentum was transected with Metzenbaum scissors. The ends of the transected omentum were tied with 0 Vicryl. The rectus muscles and peritoneum were reapproximated using 0 vicryl.  The fascia was then closed using Looped 0 PDS.  The subcutaneous layer was reapproximated with plain gut and the skin was closed with 4-0 vicryl. Steri strips were then placed and a Pico wound vac was placed for prophylactic wound healing.  The patient tolerated the procedure well. Sponge, lap, instrument and needle counts were correct x 2. She was taken to the recovery room in stable condition.    Levie Heritage, DO 06/13/2015 4:54 PM

## 2015-06-13 NOTE — Transfer of Care (Signed)
Immediate Anesthesia Transfer of Care Note  Patient: Eileen [name]  Procedure(s) Performed: Procedure(s) with comments: REPEAT CESAREAN SECTION WITH BILATERAL TUBAL LIGATION (Bilateral) - Requested 06/13/15 @ 2:45p  Patient Location: PACU  Anesthesia Type:Spinal and Epidural  Level of Consciousness: awake, alert  and oriented  Airway & Oxygen Therapy: Patient Spontanous Breathing  Post-op Assessment: Report given to RN and Post -op Vital signs reviewed and stable  Post vital signs: Reviewed and stable  Last Vitals:  Filed Vitals:   06/13/15 1159  BP: 164/99  Pulse:   Temp:   Resp:     Complications: No apparent anesthesia complications

## 2015-06-14 ENCOUNTER — Ambulatory Visit: Payer: Medicaid Other | Admitting: Internal Medicine

## 2015-06-14 ENCOUNTER — Encounter (HOSPITAL_COMMUNITY): Payer: Self-pay | Admitting: Family Medicine

## 2015-06-14 LAB — CBC
HCT: 31.1 % — ABNORMAL LOW (ref 36.0–46.0)
HEMOGLOBIN: 10 g/dL — AB (ref 12.0–15.0)
MCH: 26.1 pg (ref 26.0–34.0)
MCHC: 32.2 g/dL (ref 30.0–36.0)
MCV: 81.2 fL (ref 78.0–100.0)
PLATELETS: 198 10*3/uL (ref 150–400)
RBC: 3.83 MIL/uL — AB (ref 3.87–5.11)
RDW: 16.1 % — ABNORMAL HIGH (ref 11.5–15.5)
WBC: 6.7 10*3/uL (ref 4.0–10.5)

## 2015-06-14 LAB — TYPE AND SCREEN
ABO/RH(D): O POS
Antibody Screen: NEGATIVE
UNIT DIVISION: 0
Unit division: 0
Unit division: 0
Unit division: 0

## 2015-06-14 MED ORDER — NALBUPHINE HCL 10 MG/ML IJ SOLN: 5.0000 mg | INTRAMUSCULAR | Status: DC | PRN

## 2015-06-14 MED ORDER — NALBUPHINE HCL 10 MG/ML IJ SOLN: 5.0000 mg | Freq: Once | INTRAMUSCULAR | Status: DC | PRN

## 2015-06-14 MED ORDER — EMTRICITABINE-TENOFOVIR DF 200-300 MG PO TABS
1.0000 | ORAL_TABLET | Freq: Every day | ORAL | Status: DC
  Administered 2015-06-14 – 2015-06-15 (×2): 1 via ORAL
  Filled 2015-06-14 (×3): qty 1

## 2015-06-14 MED ORDER — DIPHENHYDRAMINE HCL 50 MG/ML IJ SOLN: 12.5000 mg | INTRAMUSCULAR | Status: DC | PRN

## 2015-06-14 MED ORDER — RITONAVIR 100 MG PO TABS
100.0000 mg | ORAL_TABLET | Freq: Every day | ORAL | Status: DC
  Administered 2015-06-14 – 2015-06-15 (×2): 100 mg via ORAL
  Filled 2015-06-14 (×3): qty 1

## 2015-06-14 MED ORDER — NALOXONE HCL 0.4 MG/ML IJ SOLN: 0.4000 mg | INTRAMUSCULAR | Status: DC | PRN

## 2015-06-14 MED ORDER — NALOXONE HCL 1 MG/ML IJ SOLN
1.0000 ug/kg/h | INTRAMUSCULAR | Status: DC | PRN
  Filled 2015-06-14: qty 2

## 2015-06-14 MED ORDER — ALBUTEROL SULFATE (2.5 MG/3ML) 0.083% IN NEBU: 3.0000 mL | INHALATION_SOLUTION | Freq: Four times a day (QID) | RESPIRATORY_TRACT | Status: DC | PRN

## 2015-06-14 MED ORDER — ONDANSETRON HCL 4 MG/2ML IJ SOLN: 4.0000 mg | Freq: Three times a day (TID) | INTRAMUSCULAR | Status: DC | PRN

## 2015-06-14 MED ORDER — DIPHENHYDRAMINE HCL 25 MG PO CAPS: 25.0000 mg | ORAL_CAPSULE | ORAL | Status: DC | PRN

## 2015-06-14 MED ORDER — DARUNAVIR ETHANOLATE 800 MG PO TABS
800.0000 mg | ORAL_TABLET | Freq: Every day | ORAL | Status: DC
  Administered 2015-06-14 – 2015-06-15 (×2): 800 mg via ORAL
  Filled 2015-06-14 (×3): qty 1

## 2015-06-14 MED ORDER — SODIUM CHLORIDE 0.9 % IJ SOLN: 3.0000 mL | INTRAMUSCULAR | Status: DC | PRN
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
## 2015-06-14 NOTE — Addendum Note (Signed)
Addendum  created 06/14/15 1056 by Janeece Agee, CRNA   Modules edited: Notes Section   Notes Section:  File: [id number]

## 2015-06-14 NOTE — Progress Notes (Signed)
Patient ID: Eileen [name], female   DOB: Dec 16, 1985, 29 y.o.   MRN: [id number] Reordered HIV meds to start in the morning, triple therapy

## 2015-06-14 NOTE — Progress Notes (Signed)
UR chart review completed.  

## 2015-06-14 NOTE — Anesthesia Postprocedure Evaluation (Signed)
  Anesthesia Post-op Note  Patient: Eileen [name]  Procedure(s) Performed: Procedure(s) with comments: REPEAT CESAREAN SECTION WITH BILATERAL TUBAL LIGATION (Bilateral) - Requested 06/13/15 @ 2:45p  Patient Location: Mother/Baby  Anesthesia Type:Spinal and Epidural  Level of Consciousness: awake, alert  and oriented  Airway and Oxygen Therapy: Patient Spontanous Breathing  Post-op Pain: none  Post-op Assessment: Post-op Vital signs reviewed and Patient's Cardiovascular Status Stable LLE Motor Response: Purposeful movement LLE Sensation: Tingling RLE Motor Response: Purposeful movement RLE Sensation: Tingling      Post-op Vital Signs: Reviewed and stable  Last Vitals:  Filed Vitals:   06/14/15 0915  BP: 115/59  Pulse: 85  Temp: 36.8 C  Resp: 16    Complications: No apparent anesthesia complications

## 2015-06-14 NOTE — Progress Notes (Signed)
Subjective: Postpartum Day 1: Cesarean Delivery Patient reports incisional pain and tolerating PO.    Objective: Vital signs in last 24 hours: Temp:  [97.6 F (36.4 C)-98.7 F (37.1 C)] 98.3 F (36.8 C) (08/23 0519) Pulse Rate:  [65-81] 78 (08/23 0519) Resp:  [10-20] 14 (08/23 0519) BP: (124-164)/(60-99) 128/67 mmHg (08/23 0519) SpO2:  [90 %-97 %] 93 % (08/23 0519) FiO2 (%):  [90 %-91 %] 91 % (08/22 2306)  Physical Exam:  General: alert, cooperative and no distress  Heart rate regular, S1S2, no dysrhthmia Lungs have coarse rales throughout upper and middle lobes bilaterally, somewhat cleared with coughing  Lochia: appropriate Uterine Fundus: firm Incision: healing well, no significant drainage, Pico Pump in place DVT Evaluation: No evidence of DVT seen on physical exam.   Recent Labs  06/14/15 0605  HGB 10.0*  HCT 31.1*    Assessment/Plan: Status post Cesarean section. Doing well postoperatively. Pulmonary congestion  Discussed need for cough and deep breathing Discussed risk of pneumonia if she doesn't clear secretions Continue current care.  [facility] 06/14/2015, 7:18 AM

## 2015-06-15 MED ORDER — DOCUSATE SODIUM 100 MG PO CAPS: 100.0000 mg | ORAL_CAPSULE | Freq: Two times a day (BID) | ORAL | Status: DC

## 2015-06-15 MED ORDER — OXYCODONE-ACETAMINOPHEN 5-325 MG PO TABS: 1.0000 | ORAL_TABLET | Freq: Four times a day (QID) | ORAL | Status: DC | PRN

## 2015-06-15 MED ORDER — IBUPROFEN 600 MG PO TABS: 600.0000 mg | ORAL_TABLET | Freq: Four times a day (QID) | ORAL | Status: DC

## 2015-06-15 NOTE — Progress Notes (Signed)
Upon entering the room, pt is sleeping with both infants in the bed with her. Pt states that she will stay awake, but would not allow nurse to place infants in bassinet. Education reinforced about safe sleep and pt alert and attentive to infants when nurse left the room. Sherald Barge

## 2015-06-15 NOTE — Progress Notes (Signed)
POSTPARTUM PROGRESS NOTE  Post Partum Day 2 Subjective:  Eileen [name] is a 29 y.o. [id number] [redacted]w[redacted]d s/p rLTCS.  No acute events overnight.  Pt denies problems with ambulating, voiding or po intake.  She denies nausea or vomiting.  Pain is moderately controlled.  She has had flatus. She has not had bowel movement.  Lochia Minimal.   Objective: Blood pressure 143/55, pulse 89, temperature 97.9 F (36.6 C), temperature source Oral, resp. rate 16, last menstrual period 10/11/2014, SpO2 94 %, unknown if currently breastfeeding.  Physical Exam:  General: alert, cooperative and no distress Lochia:normal flow Chest: CTAB Heart: RRR no m/r/g Abdomen: +BS, soft, nontender,  Uterine Fundus: firm but difficult to assess given patient's adiposity; incision intact, wound vac in place DVT Evaluation: No evidence of DVT seen on physical exam. Extremities: trace  edema   Recent Labs  06/14/15 0605  HGB 10.0*  HCT 31.1*    Assessment/Plan:  ASSESSMENT: Eileen [name] is a 29 y.o. F6O1308 [redacted]w[redacted]d s/p rltcs and btl. POD2. Twin gestation, thinks likely will need to remain at least a couple more days. Pain moderately well controlled, not ambulating much.  Plan for discharge tomorrow   LOS: 2 days   Silvano Bilis 06/15/2015, 7:06 AM

## 2015-06-15 NOTE — Discharge Summary (Signed)
Obstetric Discharge Summary Reason for Admission: Scheduled rLTCS Prenatal Procedures: NST and ultrasound Intrapartum Procedures: cesarean: low cervical, transverse and tubal ligation Postpartum Procedures: none Complications-Operative and Postpartum: none HEMOGLOBIN  Date Value Ref Range Status  06/14/2015 10.0* 12.0 - 15.0 g/dL Final   HCT  Date Value Ref Range Status  06/14/2015 31.1* 36.0 - 46.0 % Final   Hospital Course:  Patient presented for schedule RLTCS with BTL. Pregnancy complicated by HIV, HTN, previous CS x 3,  di-di twins, and super morbid obesity. CS was routine with no complications. PECO wound vac was placed given BMI of 62. Patient has an uncomplicated post-operative course. She was feeling well on the the day of discharge, eating well, ambulating well, passing gas. Plan to have patient seen by [facility] for BP check in 3-4 days and then removal of PECO/Wound check.  She is at risk for delayed wound healing (obesity and immunosuppressed) and will have patient follow up in clinic for wound check as well.   Physical Exam:  General: alert and cooperative Lochia: appropriate Uterine Fundus: firm Incision: healing well, no significant drainage, no dehiscence, no significant erythema DVT Evaluation: No evidence of DVT seen on physical exam.  Discharge Diagnoses: Term Pregnancy-delivered  Discharge Information: Date: 06/15/2015 Activity: pelvic rest Diet: routine Medications: Ibuprofen, Colace and Percocet Condition: stable Instructions: refer to practice specific booklet Discharge to: home Follow-up Information    Follow up with [facility]  CLINIC In 6 weeks.   Contact information:   [address] [id number] mc [place] [address] [phone]      Follow up with [facility] In 1 week.   Why:  Wound check   Contact information:   [address] [id number] mc [place] [address] [phone]     Newborn Data:   Lena, Gores [[id number]]  Live born female  Birth Weight: 5 lb 13.1 oz (2639 g) APGAR: 9, 9   Charmian, Forbis [[id number]]  Live born female  Birth Weight: 5 lb 13.5 oz (2651 g) APGAR: 9, 9  Home with mother.  Isa Rankin [facility] 06/15/2015, 11:46 AM

## 2015-06-19 ENCOUNTER — Inpatient Hospital Stay (HOSPITAL_COMMUNITY)
Admission: AD | Admit: 2015-06-19 | Discharge: 2015-06-19 | Disposition: A | Discharge status: Home / Self Care | Payer: Medicaid Other | Source: Ambulatory Visit | Attending: Obstetrics & Gynecology | Admitting: Obstetrics & Gynecology

## 2015-06-19 ENCOUNTER — Encounter (HOSPITAL_COMMUNITY): Payer: Self-pay | Admitting: *Deleted

## 2015-06-19 DIAGNOSIS — O9089 Other complications of the puerperium, not elsewhere classified: Secondary | ICD-10-CM | POA: Diagnosis present

## 2015-06-19 DIAGNOSIS — L24A9 Irritant contact dermatitis due friction or contact with other specified body fluids: Secondary | ICD-10-CM

## 2015-06-19 DIAGNOSIS — T148XXA Other injury of unspecified body region, initial encounter: Secondary | ICD-10-CM

## 2015-06-19 DIAGNOSIS — O34219 Maternal care for unspecified type scar from previous cesarean delivery: Secondary | ICD-10-CM

## 2015-06-19 NOTE — MAU Provider Note (Signed)
History     CSN: [id number]  Arrival date and time: 06/19/15 4098   First Provider Initiated Contact with Patient 06/19/15 1910      No chief complaint on file.  HPI This is a 29 y.o. female who is 6 days post-op who presents with complaint that she feels like her dressing and the PICO pump is coming off and it "all feels very heavy".  States pain is tolerable but rates it a "6".  Bleeding/lochia light  RN Note:  Expand All Collapse All   C/S last Monday, states she feels like wound vac & dressing are coming off, "it feels very heavy." Pt states her incision pain stays at a 6 all the time. States bleeding is now like light period.           OB History as of 06/13/15    Gravida Para Term Preterm AB TAB SAB Ectopic Multiple Living   5 4 4  0 1 0 1 0 1 5      Past Medical History  Diagnosis Date  . HIV infection   . Thyroid disease   . Genital herpes   . Syphilis     was treated  . Obesity   . HIV (human immunodeficiency virus infection)   . Anesthesia complication-postpartum     stopped breathing after spinal with c/s, in ICU  . Complication of anesthesia   . PONV (postoperative nausea and vomiting)   . Hypertension     with pregnancy  . Gestational diabetes     resolved - Hx with 1st preg but not with 2nd or this preganacy  . Hypothyroidism     Past Surgical History  Procedure Laterality Date  . Cesarean section    . Wisdom tooth extraction    . Cesarean section N/A 07/07/2013    Procedure: REPEAT CESAREAN SECTION, ;  Surgeon: Lesly Dukes, MD;  Location: [facility] ORS;  Service: Obstetrics;  Laterality: N/A;  . Cesarean section with bilateral tubal ligation Bilateral 06/13/2015    Procedure: REPEAT CESAREAN SECTION WITH BILATERAL TUBAL LIGATION;  Surgeon: Levie Heritage, DO;  Location: [facility] ORS;  Service: Obstetrics;  Laterality: Bilateral;  Requested 06/13/15 @ 2:45p    Family History  Problem Relation Age of Onset  . Anesthesia problems Neg Hx   .  Hypotension Neg Hx   . Malignant hyperthermia Neg Hx   . Pseudochol deficiency Neg Hx   . Diabetes Maternal Grandmother     Social History  Substance Use Topics  . Smoking status: Current Every Day Smoker -- 0.50 packs/day for 12 years    Types: Cigarettes  . Smokeless tobacco: Never Used  . Alcohol Use: No    Allergies:  Allergies  Allergen Reactions  . Sulfonamide Derivatives Anaphylaxis  . Other     Steroids, unknown reaction    Prescriptions prior to admission  Medication Sig Dispense Refill Last Dose  . acetaminophen (TYLENOL) 500 MG tablet Take 1,500 mg by mouth every 4 (four) hours as needed for moderate pain (pain).   06/11/2015  . albuterol (PROVENTIL HFA;VENTOLIN HFA) 108 (90 BASE) MCG/ACT inhaler Inhale 2 puffs into the lungs every 6 (six) hours as needed for wheezing or shortness of breath. 1 Inhaler 0 Past Month at Unknown time  . Darunavir Ethanolate (PREZISTA) 800 MG tablet Take 1 tablet (800 mg total) by mouth daily. 30 tablet 5 06/12/2015 at Unknown time  . docusate sodium (COLACE) 100 MG capsule Take 1 capsule (100 mg total) by mouth  2 (two) times daily. 60 capsule 3   . emtricitabine-tenofovir (TRUVADA) 200-300 MG per tablet Take 1 tablet by mouth daily. 30 tablet 5 06/12/2015 at Unknown time  . ibuprofen (ADVIL,MOTRIN) 600 MG tablet Take 1 tablet (600 mg total) by mouth every 6 (six) hours. 90 tablet 3   . labetalol (NORMODYNE) 300 MG tablet Take 1 tablet (300 mg total) by mouth 2 (two) times daily. 60 tablet 1 06/13/2015 at 0930  . oxyCODONE-acetaminophen (PERCOCET/ROXICET) 5-325 MG per tablet Take 1 tablet by mouth every 6 (six) hours as needed (for pain scale 4-7). 30 tablet 0   . oxyCODONE-acetaminophen (ROXICET) 5-325 MG per tablet Take 1 tablet by mouth every 4 (four) hours as needed for severe pain. 20 tablet 0 06/13/2015 at Unknown time  . Prenatal Vit-Fe Fumarate-FA (PRENATAL MULTIVITAMIN) TABS tablet Take 1 tablet by mouth daily at 12 noon. (Patient not  taking: Reported on 04/14/2015) 30 tablet 5 Unknown at Unknown time  . ritonavir (NORVIR) 100 MG TABS tablet Take 1 tablet (100 mg total) by mouth daily. 30 tablet 5 06/12/2015 at Unknown time    Review of Systems  Constitutional: Negative for fever, chills and malaise/fatigue.  Gastrointestinal: Positive for abdominal pain. Negative for nausea, vomiting, diarrhea and constipation.  Genitourinary: Negative for dysuria.   Physical Exam   Blood pressure 135/71, pulse 80, temperature 98.4 F (36.9 C), temperature source Oral, resp. rate 18, last menstrual period 10/11/2014, unknown if currently breastfeeding.  Physical Exam  Constitutional: She is oriented to person, place, and time. She appears well-developed and well-nourished. No distress.  Cardiovascular: Normal rate and regular rhythm.   Respiratory: Effort normal. No respiratory distress.  GI: Soft. She exhibits no distension. There is tenderness. There is no rebound and no guarding.  Dressing was loose on half of one side PICO non-adherent Dressing saturated with old drainage Incision clean and intact, no erethema on incision There is erethema around edges where tape was, in shape of tape part of dressing  Musculoskeletal: Normal range of motion.  Neurological: She is alert and oriented to person, place, and time.  Skin: Skin is warm and dry.  Psychiatric: She has a normal mood and affect.    MAU Course  Procedures  MDM Dressing removed Sponges placed on incision to keep it dry  Assessment and Plan  A:  Dressing non-adherent       PICO and dressing removed       Incision clean and intact  P:  Discharge home       Wound care reviewed        Follow up in clinic later this week for wound check  [facility] 06/19/2015, 7:10 PM

## 2015-06-19 NOTE — MAU Note (Addendum)
C/S last Monday, states she feels like wound vac & dressing are coming off, "it feels very heavy."  Pt states her incision pain stays at a 6 all the time.  States bleeding is now like light period.

## 2015-06-19 NOTE — Discharge Instructions (Signed)
Incision Care °An incision is when a surgeon cuts into your body tissues. After surgery, the incision needs to be cared for properly to prevent infection.  °HOME CARE INSTRUCTIONS  °· Take all medicine as directed by your caregiver. Only take over-the-counter or prescription medicines for pain, discomfort, or fever as directed by your caregiver. °· Do not remove your bandage (dressing) or get your incision wet until your surgeon gives you permission. In the event that your dressing becomes wet, dirty, or starts to smell, change the dressing and call your surgeon for instructions as soon as possible. °· Take showers. Do not take tub baths, swim, or do anything that may soak the wound until it is healed. °· Resume your normal diet and activities as directed or allowed. °· Avoid lifting any weight until you are instructed otherwise. °· Use anti-itch antihistamine medicine as directed by your caregiver. The wound may itch when it is healing. Do not pick or scratch at the wound. °· Follow up with your caregiver for stitch (suture) or staple removal as directed. °· Drink enough fluids to keep your urine clear or pale yellow. °SEEK MEDICAL CARE IF:  °· You have redness, swelling, or increasing pain in the wound that is not controlled with medicine. °· You have drainage, blood, or pus coming from the wound that lasts longer than 1 day. °· You develop muscle aches, chills, or a general ill feeling. °· You notice a bad smell coming from the wound or dressing. °· Your wound edges separate after the sutures, staples, or skin adhesive strips have been removed. °· You develop persistent nausea or vomiting. °SEEK IMMEDIATE MEDICAL CARE IF:  °· You have a fever. °· You develop a rash. °· You develop dizzy episodes or faint while standing. °· You have difficulty breathing. °· You develop any reaction or side effects to medicine given. °MAKE SURE YOU:  °· Understand these instructions. °· Will watch your condition. °· Will get help  right away if you are not doing well or get worse. °Document Released: 04/27/2005 Document Revised: 12/31/2011 Document Reviewed: 12/02/2013 °ExitCare® Patient Information ©2015 [employer]. This information is not intended to replace advice given to you by your health care provider. Make sure you discuss any questions you have with your health care provider. ° ° °

## 2015-06-23 ENCOUNTER — Ambulatory Visit: Payer: Medicaid Other

## 2015-06-23 ENCOUNTER — Other Ambulatory Visit: Payer: Self-pay | Admitting: Family Medicine

## 2015-06-30 ENCOUNTER — Ambulatory Visit: Payer: Medicaid Other | Admitting: *Deleted

## 2015-06-30 ENCOUNTER — Encounter: Payer: Self-pay | Admitting: *Deleted

## 2015-06-30 DIAGNOSIS — Z5189 Encounter for other specified aftercare: Secondary | ICD-10-CM

## 2015-06-30 NOTE — Progress Notes (Signed)
Pt in for incision check. Incision is clean and dry. Some slight redness. Dr. Debroah Loop came in and looked at patients incision and advised that she keep it clean and dry but otherwise its looks good. Patient also released to return to work by Dr. Debroah Loop. Letter given. Patient had no further questions.

## 2015-07-25 ENCOUNTER — Ambulatory Visit: Payer: Medicaid Other | Admitting: Family Medicine

## 2015-09-01 ENCOUNTER — Ambulatory Visit: Payer: Medicaid Other | Admitting: Medical

## 2015-09-02 ENCOUNTER — Encounter: Payer: Self-pay | Admitting: *Deleted

## 2015-09-07 ENCOUNTER — Emergency Department (INDEPENDENT_AMBULATORY_CARE_PROVIDER_SITE_OTHER): Payer: Self-pay

## 2015-09-07 ENCOUNTER — Emergency Department (INDEPENDENT_AMBULATORY_CARE_PROVIDER_SITE_OTHER)
Admission: EM | Admit: 2015-09-07 | Discharge: 2015-09-07 | Disposition: A | Discharge status: Home / Self Care | Payer: Medicaid Other | Source: Home / Self Care

## 2015-09-07 ENCOUNTER — Encounter (HOSPITAL_COMMUNITY): Payer: Self-pay | Admitting: Emergency Medicine

## 2015-09-07 DIAGNOSIS — M25571 Pain in right ankle and joints of right foot: Secondary | ICD-10-CM

## 2015-09-07 MED ORDER — KETOROLAC TROMETHAMINE 60 MG/2ML IM SOLN
INTRAMUSCULAR | Status: AC
  Filled 2015-09-07: qty 2

## 2015-09-07 MED ORDER — KETOROLAC TROMETHAMINE 60 MG/2ML IM SOLN
60.0000 mg | Freq: Once | INTRAMUSCULAR | Status: AC
  Administered 2015-09-07: 60 mg via INTRAMUSCULAR

## 2015-09-07 NOTE — Discharge Instructions (Signed)
Joint Pain Joint pain, which is also called arthralgia, can be caused by many things. Joint pain often goes away when you follow your health care provider's instructions for relieving pain at home. However, joint pain can also be caused by conditions that require further treatment. Common causes of joint pain include:  Bruising in the area of the joint.  Overuse of the joint.  Wear and tear on the joints that occur with aging (osteoarthritis).  Various other forms of arthritis.  A buildup of a crystal form of uric acid in the joint (gout).  Infections of the joint (septic arthritis) or of the bone (osteomyelitis). Your health care provider may recommend medicine to help with the pain. If your joint pain continues, additional tests may be needed to diagnose your condition. HOME CARE INSTRUCTIONS Watch your condition for any changes. Follow these instructions as directed to lessen the pain that you are feeling.  Take medicines only as directed by your health care provider.  Rest the affected area for as long as your health care provider says that you should. If directed to do so, raise the painful joint above the level of your heart while you are sitting or lying down.  Do not do things that cause or worsen pain.  If directed, apply ice to the painful area:  Put ice in a plastic bag.  Place a towel between your skin and the bag.  Leave the ice on for 20 minutes, 2-3 times per day.  Wear an elastic bandage, splint, or sling as directed by your health care provider. Loosen the elastic bandage or splint if your fingers or toes become numb and tingle, or if they turn cold and blue.  Begin exercising or stretching the affected area as directed by your health care provider. Ask your health care provider what types of exercise are safe for you.  Keep all follow-up visits as directed by your health care provider. This is important. SEEK MEDICAL CARE IF:  Your pain increases, and medicine  does not help.  Your joint pain does not improve within 3 days.  You have increased bruising or swelling.  You have a fever.  You lose 10 lb (4.5 kg) or more without trying. SEEK IMMEDIATE MEDICAL CARE IF:  You are not able to move the joint.  Your fingers or toes become numb or they turn cold and blue.   This information is not intended to replace advice given to you by your health care provider. Make sure you discuss any questions you have with your health care provider.   Document Released: 10/08/2005 Document Revised: 10/29/2014 Document Reviewed: 07/20/2014 Elsevier Interactive Patient Education 2016 Elsevier Inc.  Ankle Pain Ankle pain is a common symptom. The bones, cartilage, tendons, and muscles of the ankle joint perform a lot of work each day. The ankle joint holds your body weight and allows you to move around. Ankle pain can occur on either side or back of 1 or both ankles. Ankle pain may be sharp and burning or dull and aching. There may be tenderness, stiffness, redness, or warmth around the ankle. The pain occurs more often when a person walks or puts pressure on the ankle. CAUSES  There are many reasons ankle pain can develop. It is important to work with your caregiver to identify the cause since many conditions can impact the bones, cartilage, muscles, and tendons. Causes for ankle pain include:  Injury, including a break (fracture), sprain, or strain often due to a fall,  sports, or a high-impact activity.  Swelling (inflammation) of a tendon (tendonitis).  Achilles tendon rupture.  Ankle instability after repeated sprains and strains.  Poor foot alignment.  Pressure on a nerve (tarsal tunnel syndrome).  Arthritis in the ankle or the lining of the ankle.  Crystal formation in the ankle (gout or pseudogout). DIAGNOSIS  A diagnosis is based on your medical history, your symptoms, results of your physical exam, and results of diagnostic tests. Diagnostic  tests may include X-ray exams or a computerized magnetic scan (magnetic resonance imaging, MRI). TREATMENT  Treatment will depend on the cause of your ankle pain and may include:  Keeping pressure off the ankle and limiting activities.  Using crutches or other walking support (a cane or brace).  Using rest, ice, compression, and elevation.  Participating in physical therapy or home exercises.  Wearing shoe inserts or special shoes.  Losing weight.  Taking medications to reduce pain or swelling or receiving an injection.  Undergoing surgery. HOME CARE INSTRUCTIONS   Only take over-the-counter or prescription medicines for pain, discomfort, or fever as directed by your caregiver.  Put ice on the injured area.  Put ice in a plastic bag.  Place a towel between your skin and the bag.  Leave the ice on for 15-20 minutes at a time, 03-04 times a day.  Keep your leg raised (elevated) when possible to lessen swelling.  Avoid activities that cause ankle pain.  Follow specific exercises as directed by your caregiver.  Record how often you have ankle pain, the location of the pain, and what it feels like. This information may be helpful to you and your caregiver.  Ask your caregiver about returning to work or sports and whether you should drive.  Follow up with your caregiver for further examination, therapy, or testing as directed. SEEK MEDICAL CARE IF:   Pain or swelling continues or worsens beyond 1 week.  You have an oral temperature above 102 F (38.9 C).  You are feeling unwell or have chills.  You are having an increasingly difficult time with walking.  You have loss of sensation or other new symptoms.  You have questions or concerns. MAKE SURE YOU:   Understand these instructions.  Will watch your condition.  Will get help right away if you are not doing well or get worse.   This information is not intended to replace advice given to you by your health care  provider. Make sure you discuss any questions you have with your health care provider.   Document Released: 03/28/2010 Document Revised: 12/31/2011 Document Reviewed: 05/10/2015 Elsevier Interactive Patient Education 2016 Elsevier Inc.  [employer] Therapy Heat therapy can help ease sore, stiff, injured, and tight muscles and joints. Heat relaxes your muscles, which may help ease your pain. Heat therapy should only be used on old, pre-existing, or long-lasting (chronic) injuries. Do not use heat therapy unless told by your doctor. HOW TO USE HEAT THERAPY There are several different kinds of heat therapy, including:  Moist heat pack.  Warm water bath.  Hot water bottle.  Electric heating pad.  Heated gel pack.  Heated wrap.  Electric heating pad. GENERAL HEAT THERAPY RECOMMENDATIONS   Do not sleep while using heat therapy. Only use heat therapy while you are awake.  Your skin may turn pink while using heat therapy. Do not use heat therapy if your skin turns red.  Do not use heat therapy if you have new pain.  High heat or long exposure to  heat can cause burns. Be careful when using heat therapy to avoid burning your skin.  Do not use heat therapy on areas of your skin that are already irritated, such as with a rash or sunburn. GET HELP IF:   You have blisters, redness, swelling (puffiness), or numbness.  You have new pain.  Your pain is worse. MAKE SURE YOU:  Understand these instructions.  Will watch your condition.  Will get help right away if you are not doing well or get worse.   This information is not intended to replace advice given to you by your health care provider. Make sure you discuss any questions you have with your health care provider.   Document Released: 12/31/2011 Document Revised: 10/29/2014 Document Reviewed: 12/01/2013 Elsevier Interactive Patient Education 2016 Elsevier Inc.  Cryotherapy Cryotherapy is when you put ice on your injury. Ice helps  lessen pain and puffiness (swelling) after an injury. Ice works the best when you start using it in the first 24 to 48 hours after an injury. HOME CARE  Put a dry or damp towel between the ice pack and your skin.  You may press gently on the ice pack.  Leave the ice on for no more than 10 to 20 minutes at a time.  Check your skin after 5 minutes to make sure your skin is okay.  Rest at least 20 minutes between ice pack uses.  Stop using ice when your skin loses feeling (numbness).  Do not use ice on someone who cannot tell you when it hurts. This includes small children and people with memory problems (dementia). GET HELP RIGHT AWAY IF:  You have white spots on your skin.  Your skin turns blue or pale.  Your skin feels waxy or hard.  Your puffiness gets worse. MAKE SURE YOU:   Understand these instructions.  Will watch your condition.  Will get help right away if you are not doing well or get worse.   This information is not intended to replace advice given to you by your health care provider. Make sure you discuss any questions you have with your health care provider.   Document Released: 03/26/2008 Document Revised: 12/31/2011 Document Reviewed: 05/31/2011 Elsevier Interactive Patient Education [employer]2016 Elsevier Inc.

## 2015-09-07 NOTE — ED Provider Notes (Addendum)
CSN: [id number]     Arrival date & time 09/07/15  1630 History   None    Chief Complaint  Patient presents with  . Ankle Pain   (Consider location/radiation/quality/duration/timing/severity/associated sxs/prior Treatment) HPI History obtained from patient:   LOCATION: right ankle SEVERITY:7 DURATION:since tuesday CONTEXT:awoke with pain, no known injury QUALITY: MODIFYING FACTORS: tylenol ASSOCIATED SYMPTOMS: painful to walk TIMING:constant OCCUPATION:[employer]  Past Medical History  Diagnosis Date  . HIV infection (HCC)   . Thyroid disease   . Genital herpes   . Syphilis     was treated  . Obesity   . HIV (human immunodeficiency virus infection) (HCC)   . Anesthesia complication-postpartum     stopped breathing after spinal with c/s, in ICU  . Complication of anesthesia   . PONV (postoperative nausea and vomiting)   . Hypertension     with pregnancy  . Gestational diabetes     resolved - Hx with 1st preg but not with 2nd or this preganacy  . Hypothyroidism    Past Surgical History  Procedure Laterality Date  . Cesarean section    . Wisdom tooth extraction    . Cesarean section N/A 07/07/2013    Procedure: REPEAT CESAREAN SECTION, ;  Surgeon: Lesly DukesKelly H Leggett, MD;  Location: [facility];  Service: Obstetrics;  Laterality: N/A;  . Cesarean section with bilateral tubal ligation Bilateral 06/13/2015    Procedure: REPEAT CESAREAN SECTION WITH BILATERAL TUBAL LIGATION;  Surgeon: Levie HeritageJacob J Stinson, DO;  Location: [facility];  Service: Obstetrics;  Laterality: Bilateral;  Requested 06/13/15 @ 2:45p   Family History  Problem Relation Age of Onset  . Anesthesia problems Neg Hx   . Hypotension Neg Hx   . Malignant hyperthermia Neg Hx   . Pseudochol deficiency Neg Hx   . Diabetes Maternal Grandmother    Social History  Substance Use Topics  . Smoking status: Current Every Day Smoker -- 0.50 packs/day for 12 years    Types: Cigarettes  . Smokeless tobacco: Never Used  . Alcohol  Use: No   OB History    Gravida Para Term Preterm AB TAB SAB Ectopic Multiple Living   5 4 4  0 1 0 1 0 1 5     Review of Systems ROS +'ve right ankle  Denies: HEADACHE, NAUSEA, ABDOMINAL PAIN, CHEST PAIN, CONGESTION, DYSURIA, SHORTNESS OF BREATH  Allergies  Sulfonamide derivatives and Other  Home Medications   Prior to Admission medications   Medication Sig Start Date End Date Taking? Authorizing Provider  acetaminophen (TYLENOL) 500 MG tablet Take 1,500 mg by mouth every 4 (four) hours as needed for moderate pain.     Historical Provider, MD  albuterol (PROVENTIL HFA;VENTOLIN HFA) 108 (90 BASE) MCG/ACT inhaler Inhale 2 puffs into the lungs every 6 (six) hours as needed for wheezing or shortness of breath. 05/02/15   Tereso NewcomerUgonna A Anyanwu, MD  Darunavir Ethanolate (PREZISTA) 800 MG tablet Take 1 tablet (800 mg total) by mouth daily. 04/05/15   Gardiner Barefootobert W Comer, MD  docusate sodium (COLACE) 100 MG capsule Take 1 capsule (100 mg total) by mouth 2 (two) times daily. 06/15/15   Federico FlakeKimberly Niles Newton, MD  emtricitabine-tenofovir (TRUVADA) 200-300 MG per tablet Take 1 tablet by mouth daily. 04/05/15   Gardiner Barefootobert W Comer, MD  ibuprofen (ADVIL,MOTRIN) 600 MG tablet Take 1 tablet (600 mg total) by mouth every 6 (six) hours. Patient taking differently: Take 600 mg by mouth every 6 (six) hours as needed for mild pain.  06/15/15  Federico Flake, MD  labetalol (NORMODYNE) 300 MG tablet Take 1 tablet (300 mg total) by mouth 2 (two) times daily. 06/02/15   Reva Bores, MD  oxyCODONE-acetaminophen (PERCOCET/ROXICET) 5-325 MG per tablet Take 1 tablet by mouth every 6 (six) hours as needed (for pain scale 4-7). 06/15/15   Federico Flake, MD  ritonavir (NORVIR) 100 MG TABS tablet Take 1 tablet (100 mg total) by mouth daily. 04/05/15   Gardiner Barefoot, MD   Meds Ordered and Administered this Visit   Medications  ketorolac (TORADOL) injection 60 mg (not administered)    BP 135/66 mmHg  Pulse 101   Temp(Src) 98.3 F (36.8 C) (Oral)  Resp 20  SpO2 100%  LMP 09/05/2015 No data found.   Physical Exam  Constitutional: She is oriented to person, place, and time. She appears well-developed and well-nourished. No distress.  HENT:  Head: Normocephalic and atraumatic.  Pulmonary/Chest: Effort normal.  Musculoskeletal: She exhibits tenderness.       Right ankle: She exhibits normal range of motion. Tenderness. Lateral malleolus and medial malleolus tenderness found. No proximal fibula tenderness found.       Feet:  Neurological: She is alert and oriented to person, place, and time.  Skin: Skin is warm and dry.  Psychiatric: She has a normal mood and affect. Her behavior is normal. Judgment and thought content normal.    ED Course  Procedures (including critical care time)  Labs Review Labs Reviewed - No data to display  Imaging Review Dg Ankle Complete Right  09/07/2015  CLINICAL DATA:  Pt complains of lateral right ankle x 2 days no injury EXAM: RIGHT ANKLE - COMPLETE 3+ VIEW COMPARISON:  04/24/2011 FINDINGS: There is no evidence of fracture, dislocation, or joint effusion. There is no evidence of arthropathy or other focal bone abnormality. Obesity. Soft tissues are unremarkable. IMPRESSION: Negative. Electronically Signed   By: Corlis Leak M.D.   On: 09/07/2015 18:06     Visual Acuity Review  Right Eye Distance:   Left Eye Distance:   Bilateral Distance:    Right Eye Near:   Left Eye Near:    Bilateral Near:        Please note that pt was given Toradol 60 IM prior to discharge.  MDM   1. Ankle pain, right    No fracture identified on the x-ray. There is no obvious cause some pain in the right ankle. I have advised patient to treat herself symptomatically I have applied an Ace wrap to the ankle. She should continue Tylenol or ibuprofen and follow-up with her primary care provider. There are no worsening symptoms or symptoms do not improve. Instructions of care  provided discharged home in stable condition work note is offered but patient declines.    Tharon Aquas, PA 09/07/15 [id number]  Tharon Aquas, PA 10/20/15 1321  Tharon Aquas, [place] 10/21/15 [phone]

## 2015-09-07 NOTE — ED Notes (Signed)
Patient is complaining of right ankle pain.  Pain started Tuesday morning, patient woke with pain.  No known injury

## 2016-02-26 ENCOUNTER — Emergency Department (HOSPITAL_COMMUNITY): Payer: Medicaid Other

## 2016-02-26 ENCOUNTER — Inpatient Hospital Stay (HOSPITAL_COMMUNITY): Payer: Medicaid Other

## 2016-02-26 ENCOUNTER — Encounter (HOSPITAL_COMMUNITY): Payer: Self-pay | Admitting: Emergency Medicine

## 2016-02-26 ENCOUNTER — Inpatient Hospital Stay (HOSPITAL_COMMUNITY)
Admission: EM | Admit: 2016-02-26 | Discharge: 2016-03-07 | DRG: 463 | Disposition: A | Discharge status: Home Health | Payer: Medicaid Other | Attending: General Surgery | Admitting: General Surgery

## 2016-02-26 DIAGNOSIS — Z91199 Patient's noncompliance with other medical treatment and regimen due to unspecified reason: Secondary | ICD-10-CM | POA: Insufficient documentation

## 2016-02-26 DIAGNOSIS — S32423A Displaced fracture of posterior wall of unspecified acetabulum, initial encounter for closed fracture: Secondary | ICD-10-CM | POA: Insufficient documentation

## 2016-02-26 DIAGNOSIS — E559 Vitamin D deficiency, unspecified: Secondary | ICD-10-CM | POA: Diagnosis present

## 2016-02-26 DIAGNOSIS — B20 Human immunodeficiency virus [HIV] disease: Secondary | ICD-10-CM | POA: Diagnosis present

## 2016-02-26 DIAGNOSIS — F1410 Cocaine abuse, uncomplicated: Secondary | ICD-10-CM | POA: Diagnosis present

## 2016-02-26 DIAGNOSIS — D62 Acute posthemorrhagic anemia: Secondary | ICD-10-CM | POA: Diagnosis not present

## 2016-02-26 DIAGNOSIS — E8889 Other specified metabolic disorders: Secondary | ICD-10-CM | POA: Diagnosis present

## 2016-02-26 DIAGNOSIS — Z9119 Patient's noncompliance with other medical treatment and regimen: Secondary | ICD-10-CM | POA: Diagnosis not present

## 2016-02-26 DIAGNOSIS — S0101XA Laceration without foreign body of scalp, initial encounter: Secondary | ICD-10-CM | POA: Diagnosis present

## 2016-02-26 DIAGNOSIS — F1010 Alcohol abuse, uncomplicated: Secondary | ICD-10-CM | POA: Insufficient documentation

## 2016-02-26 DIAGNOSIS — F17200 Nicotine dependence, unspecified, uncomplicated: Secondary | ICD-10-CM | POA: Diagnosis present

## 2016-02-26 DIAGNOSIS — F1910 Other psychoactive substance abuse, uncomplicated: Secondary | ICD-10-CM | POA: Insufficient documentation

## 2016-02-26 DIAGNOSIS — S81011A Laceration without foreign body, right knee, initial encounter: Secondary | ICD-10-CM

## 2016-02-26 DIAGNOSIS — S0181XA Laceration without foreign body of other part of head, initial encounter: Secondary | ICD-10-CM

## 2016-02-26 DIAGNOSIS — F419 Anxiety disorder, unspecified: Secondary | ICD-10-CM | POA: Diagnosis present

## 2016-02-26 DIAGNOSIS — Z6841 Body Mass Index (BMI) 40.0 and over, adult: Secondary | ICD-10-CM | POA: Diagnosis not present

## 2016-02-26 DIAGNOSIS — S32402A Unspecified fracture of left acetabulum, initial encounter for closed fracture: Secondary | ICD-10-CM | POA: Diagnosis present

## 2016-02-26 DIAGNOSIS — M25519 Pain in unspecified shoulder: Secondary | ICD-10-CM

## 2016-02-26 DIAGNOSIS — M25551 Pain in right hip: Secondary | ICD-10-CM | POA: Diagnosis present

## 2016-02-26 DIAGNOSIS — T1490XA Injury, unspecified, initial encounter: Secondary | ICD-10-CM

## 2016-02-26 DIAGNOSIS — Z419 Encounter for procedure for purposes other than remedying health state, unspecified: Secondary | ICD-10-CM

## 2016-02-26 DIAGNOSIS — H109 Unspecified conjunctivitis: Secondary | ICD-10-CM | POA: Diagnosis present

## 2016-02-26 DIAGNOSIS — S129XXA Fracture of neck, unspecified, initial encounter: Secondary | ICD-10-CM | POA: Diagnosis present

## 2016-02-26 DIAGNOSIS — G8918 Other acute postprocedural pain: Secondary | ICD-10-CM | POA: Insufficient documentation

## 2016-02-26 DIAGNOSIS — S22009A Unspecified fracture of unspecified thoracic vertebra, initial encounter for closed fracture: Secondary | ICD-10-CM

## 2016-02-26 DIAGNOSIS — R000 Tachycardia, unspecified: Secondary | ICD-10-CM | POA: Insufficient documentation

## 2016-02-26 DIAGNOSIS — R509 Fever, unspecified: Secondary | ICD-10-CM

## 2016-02-26 DIAGNOSIS — S01111A Laceration without foreign body of right eyelid and periocular area, initial encounter: Secondary | ICD-10-CM | POA: Diagnosis present

## 2016-02-26 DIAGNOSIS — S329XXA Fracture of unspecified parts of lumbosacral spine and pelvis, initial encounter for closed fracture: Secondary | ICD-10-CM

## 2016-02-26 DIAGNOSIS — J189 Pneumonia, unspecified organism: Secondary | ICD-10-CM | POA: Diagnosis not present

## 2016-02-26 DIAGNOSIS — IMO0002 Reserved for concepts with insufficient information to code with codable children: Secondary | ICD-10-CM

## 2016-02-26 DIAGNOSIS — R52 Pain, unspecified: Secondary | ICD-10-CM

## 2016-02-26 LAB — CBC
HCT: 39.2 % (ref 36.0–46.0)
HCT: 34.9 % — ABNORMAL LOW (ref 36.0–46.0)
HCT: 32.4 % — ABNORMAL LOW (ref 36.0–46.0)
HEMOGLOBIN: 12.2 g/dL (ref 12.0–15.0)
Hemoglobin: 10.9 g/dL — ABNORMAL LOW (ref 12.0–15.0)
Hemoglobin: 9.8 g/dL — ABNORMAL LOW (ref 12.0–15.0)
MCH: 24.2 pg — ABNORMAL LOW (ref 26.0–34.0)
MCH: 24.7 pg — ABNORMAL LOW (ref 26.0–34.0)
MCH: 23.6 pg — ABNORMAL LOW (ref 26.0–34.0)
MCHC: 31.1 g/dL (ref 30.0–36.0)
MCHC: 31.2 g/dL (ref 30.0–36.0)
MCHC: 30.2 g/dL (ref 30.0–36.0)
MCV: 77.6 fL — ABNORMAL LOW (ref 78.0–100.0)
MCV: 79 fL (ref 78.0–100.0)
MCV: 78.1 fL (ref 78.0–100.0)
Platelets: 268 10*3/uL (ref 150–400)
Platelets: 179 10*3/uL (ref 150–400)
Platelets: 154 10*3/uL (ref 150–400)
RBC: 5.05 MIL/uL (ref 3.87–5.11)
RBC: 4.42 MIL/uL (ref 3.87–5.11)
RBC: 4.15 MIL/uL (ref 3.87–5.11)
RDW: 17.5 % — ABNORMAL HIGH (ref 11.5–15.5)
RDW: 17.6 % — ABNORMAL HIGH (ref 11.5–15.5)
RDW: 18 % — ABNORMAL HIGH (ref 11.5–15.5)
WBC: 11.8 10*3/uL — ABNORMAL HIGH (ref 4.0–10.5)
WBC: 11.4 10*3/uL — ABNORMAL HIGH (ref 4.0–10.5)
WBC: 6.9 10*3/uL (ref 4.0–10.5)

## 2016-02-26 LAB — URINE MICROSCOPIC-ADD ON

## 2016-02-26 LAB — RAPID URINE DRUG SCREEN, HOSP PERFORMED
Amphetamines: NOT DETECTED
BARBITURATES: NOT DETECTED
Benzodiazepines: NOT DETECTED
COCAINE: POSITIVE — AB
Opiates: POSITIVE — AB
Tetrahydrocannabinol: NOT DETECTED

## 2016-02-26 LAB — COMPREHENSIVE METABOLIC PANEL
ALK PHOS: 64 U/L (ref 38–126)
ALT: 26 U/L (ref 14–54)
AST: 59 U/L — AB (ref 15–41)
Albumin: 3.8 g/dL (ref 3.5–5.0)
Anion gap: 14 (ref 5–15)
BUN: 9 mg/dL (ref 6–20)
CALCIUM: 9.1 mg/dL (ref 8.9–10.3)
CHLORIDE: 105 mmol/L (ref 101–111)
CO2: 19 mmol/L — AB (ref 22–32)
Creatinine, Ser: 0.86 mg/dL (ref 0.44–1.00)
GFR calc non Af Amer: >60 mL/min (ref >60)
GLUCOSE: 176 mg/dL — AB (ref 65–99)
Potassium: 4 mmol/L (ref 3.5–5.1)
SODIUM: 138 mmol/L (ref 135–145)
TOTAL PROTEIN: 7.8 g/dL (ref 6.5–8.1)
Total Bilirubin: 0.6 mg/dL (ref 0.3–1.2)

## 2016-02-26 LAB — I-STAT CHEM 8, ED
BUN: 11 mg/dL (ref 6–20)
Calcium, Ion: 1.09 mmol/L — ABNORMAL LOW (ref 1.12–1.23)
Chloride: 105 mmol/L (ref 101–111)
Creatinine, Ser: 0.8 mg/dL (ref 0.44–1.00)
GLUCOSE: 171 mg/dL — AB (ref 65–99)
HEMATOCRIT: 41 % (ref 36.0–46.0)
Hemoglobin: 13.9 g/dL (ref 12.0–15.0)
POTASSIUM: 3.9 mmol/L (ref 3.5–5.1)
Sodium: 140 mmol/L (ref 135–145)
TCO2: 23 mmol/L (ref 0–100)

## 2016-02-26 LAB — I-STAT BETA HCG BLOOD, ED (MC, WL, AP ONLY): I-stat hCG, quantitative: <5 m[IU]/mL (ref <5)

## 2016-02-26 LAB — URINALYSIS, ROUTINE W REFLEX MICROSCOPIC
Bilirubin Urine: NEGATIVE
Glucose, UA: NEGATIVE mg/dL
HGB URINE DIPSTICK: NEGATIVE
Ketones, ur: NEGATIVE mg/dL
Nitrite: NEGATIVE
PROTEIN: NEGATIVE mg/dL
Specific Gravity, Urine: 1.024 (ref 1.005–1.030)
pH: 5.5 (ref 5.0–8.0)

## 2016-02-26 LAB — MRSA PCR SCREENING: MRSA BY PCR: NEGATIVE

## 2016-02-26 LAB — PROTIME-INR
INR: 1.09 (ref 0.00–1.49)
Prothrombin Time: 14.3 seconds (ref 11.6–15.2)

## 2016-02-26 LAB — SAMPLE TO BLOOD BANK

## 2016-02-26 LAB — I-STAT CG4 LACTIC ACID, ED: LACTIC ACID, VENOUS: 1.63 mmol/L (ref 0.5–2.0)

## 2016-02-26 LAB — ETHANOL

## 2016-02-26 MED ORDER — ACETAMINOPHEN 325 MG PO TABS
650.0000 mg | ORAL_TABLET | ORAL | Status: DC | PRN
  Administered 2016-02-28 – 2016-03-06 (×7): 650 mg via ORAL
  Filled 2016-02-26 (×7): qty 2

## 2016-02-26 MED ORDER — SODIUM CHLORIDE 0.9 % IV BOLUS (SEPSIS)
1000.0000 mL | Freq: Once | INTRAVENOUS | Status: DC
Start: 2016-02-26 — End: 2016-02-29

## 2016-02-26 MED ORDER — PANTOPRAZOLE SODIUM 40 MG IV SOLR
40.0000 mg | Freq: Every day | INTRAVENOUS | Status: DC
  Administered 2016-02-26 – 2016-03-01 (×5): 40 mg via INTRAVENOUS
  Filled 2016-02-26 (×5): qty 40

## 2016-02-26 MED ORDER — PANTOPRAZOLE SODIUM 40 MG PO TBEC
40.0000 mg | DELAYED_RELEASE_TABLET | Freq: Every day | ORAL | Status: DC
  Administered 2016-03-02 – 2016-03-07 (×6): 40 mg via ORAL
  Filled 2016-02-26 (×6): qty 1

## 2016-02-26 MED ORDER — TETANUS-DIPHTH-ACELL PERTUSSIS 5-2.5-18.5 LF-MCG/0.5 IM SUSP
0.5000 mL | Freq: Once | INTRAMUSCULAR | Status: AC
  Administered 2016-02-26: 0.5 mL via INTRAMUSCULAR
  Filled 2016-02-26: qty 0.5

## 2016-02-26 MED ORDER — MORPHINE SULFATE (PF) 4 MG/ML IV SOLN: 4.0000 mg | Freq: Once | INTRAVENOUS | Status: DC

## 2016-02-26 MED ORDER — HYDROMORPHONE HCL 1 MG/ML IJ SOLN
1.0000 mg | INTRAMUSCULAR | Status: DC | PRN
Start: 2016-02-26 — End: 2016-02-28
  Administered 2016-02-26 – 2016-02-28 (×12): 1 mg via INTRAVENOUS
  Filled 2016-02-26 (×13): qty 1

## 2016-02-26 MED ORDER — MORPHINE SULFATE 2 MG/ML IJ SOLN
INTRAMUSCULAR | Status: AC | PRN
  Administered 2016-02-26: 4 mg via INTRAVENOUS

## 2016-02-26 MED ORDER — HYDROMORPHONE HCL 1 MG/ML IJ SOLN
2.0000 mg | Freq: Once | INTRAMUSCULAR | Status: AC
  Administered 2016-02-26: 2 mg via INTRAVENOUS
  Filled 2016-02-26: qty 2

## 2016-02-26 MED ORDER — SODIUM CHLORIDE 0.9 % IV SOLN
INTRAVENOUS | Status: DC
  Administered 2016-02-26 – 2016-02-29 (×7): via INTRAVENOUS

## 2016-02-26 MED ORDER — DEXTROSE 5 % IV SOLN
3.0000 g | Freq: Once | INTRAVENOUS | Status: AC
  Administered 2016-02-26: 3 g via INTRAVENOUS
  Filled 2016-02-26 (×2): qty 3000

## 2016-02-26 MED ORDER — CETYLPYRIDINIUM CHLORIDE 0.05 % MT LIQD
7.0000 mL | Freq: Two times a day (BID) | OROMUCOSAL | Status: DC
  Administered 2016-02-26 – 2016-03-07 (×13): 7 mL via OROMUCOSAL

## 2016-02-26 MED ORDER — SODIUM CHLORIDE 0.9 % IV SOLN
INTRAVENOUS | Status: AC | PRN
  Administered 2016-02-26: 1000 mL via INTRAVENOUS

## 2016-02-26 MED ORDER — IOPAMIDOL (ISOVUE-300) INJECTION 61%
100.0000 mL | Freq: Once | INTRAVENOUS | Status: AC | PRN
  Administered 2016-02-26: 100 mL via INTRAVENOUS

## 2016-02-26 MED ORDER — OXYCODONE HCL 5 MG PO TABS
10.0000 mg | ORAL_TABLET | ORAL | Status: DC | PRN
  Administered 2016-02-26 – 2016-02-27 (×4): 10 mg via ORAL
  Filled 2016-02-26 (×4): qty 2

## 2016-02-26 MED ORDER — METHOCARBAMOL 1000 MG/10ML IJ SOLN
500.0000 mg | Freq: Three times a day (TID) | INTRAVENOUS | Status: DC | PRN
  Filled 2016-02-26: qty 5

## 2016-02-26 MED ORDER — LIDOCAINE-EPINEPHRINE 1 %-1:100000 IJ SOLN
20.0000 mL | Freq: Once | INTRAMUSCULAR | Status: AC
  Administered 2016-02-26: 20 mL
  Filled 2016-02-26: qty 1

## 2016-02-26 MED ORDER — CEFAZOLIN SODIUM 1-5 GM-% IV SOLN
1.0000 g | Freq: Once | INTRAVENOUS | Status: AC
  Administered 2016-02-26: 1 g via INTRAVENOUS
  Filled 2016-02-26: qty 50

## 2016-02-26 MED ORDER — IOPAMIDOL (ISOVUE-370) INJECTION 76%
INTRAVENOUS | Status: AC
  Administered 2016-02-26: 80 mL
  Filled 2016-02-26: qty 100

## 2016-02-26 MED ORDER — HYDROMORPHONE HCL 1 MG/ML IJ SOLN
1.0000 mg | Freq: Once | INTRAMUSCULAR | Status: AC
  Administered 2016-02-26: 1 mg via INTRAVENOUS
  Filled 2016-02-26: qty 1

## 2016-02-26 MED ORDER — ONDANSETRON HCL 4 MG PO TABS: 4.0000 mg | ORAL_TABLET | Freq: Four times a day (QID) | ORAL | Status: DC | PRN

## 2016-02-26 MED ORDER — ONDANSETRON HCL 4 MG/2ML IJ SOLN: 4.0000 mg | Freq: Four times a day (QID) | INTRAMUSCULAR | Status: DC | PRN
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
## 2016-02-26 NOTE — Progress Notes (Addendum)
Reports pain much better with left hip  Clinically the leg is out to length and no significant pain with gentle log rolling  Will recheck xray AP pelvis now  Patient would likely have to be on her side for 4-5 hours tomorrow and need to make sure with Neuro that positioning is feasible given body habitus.  High risk whatever option is undertaken.  Hip extremely unstable and needs to be repaired.  Prolonged traction in bed would seem to offer little risk reduction.  Myrene GalasMichael Junko Ohagan, MD [facility], [facility] [phone] (p)   F/u film shows improved position of the femoral head and acetabular fracture. MH 02/27/16, 00:25AM
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
## 2016-02-26 NOTE — Progress Notes (Signed)
Orthopedic Tech Progress Note Patient Details:  Eileen [name] December 04, 1985 [id number]  Musculoskeletal Traction Type of Traction: Skeletal (Balanced Suspension) Traction Weight: 30 lbs    [name] 02/26/2016, 3:03 PM
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
## 2016-02-27 ENCOUNTER — Encounter (HOSPITAL_COMMUNITY): Payer: Self-pay | Admitting: Anesthesiology

## 2016-02-27 ENCOUNTER — Inpatient Hospital Stay (HOSPITAL_COMMUNITY): Payer: Medicaid Other

## 2016-02-27 ENCOUNTER — Encounter (HOSPITAL_COMMUNITY): Admission: EM | Disposition: A | Discharge status: Home Health | Payer: Self-pay | Source: Home / Self Care

## 2016-02-27 ENCOUNTER — Inpatient Hospital Stay (HOSPITAL_COMMUNITY): Payer: Medicaid Other | Admitting: Anesthesiology

## 2016-02-27 HISTORY — PX: I & D EXTREMITY: SHX5045

## 2016-02-27 HISTORY — PX: ORIF ACETABULAR FRACTURE: SHX5029

## 2016-02-27 LAB — POCT I-STAT 7, (LYTES, BLD GAS, ICA,H+H)
Acid-base deficit: 1 mmol/L (ref 0.0–2.0)
BICARBONATE: 23.7 meq/L (ref 20.0–24.0)
Calcium, Ion: 1.05 mmol/L — ABNORMAL LOW (ref 1.12–1.23)
HCT: 27 % — ABNORMAL LOW (ref 36.0–46.0)
Hemoglobin: 9.2 g/dL — ABNORMAL LOW (ref 12.0–15.0)
O2 Saturation: 100 %
PCO2 ART: 37.2 mmHg (ref 35.0–45.0)
PO2 ART: 192 mmHg — AB (ref 80.0–100.0)
Potassium: 3.6 mmol/L (ref 3.5–5.1)
Sodium: 141 mmol/L (ref 135–145)
TCO2: 25 mmol/L (ref 0–100)
pH, Arterial: 7.411 (ref 7.350–7.450)

## 2016-02-27 LAB — POCT I-STAT 4, (NA,K, GLUC, HGB,HCT)
GLUCOSE: 138 mg/dL — AB (ref 65–99)
Glucose, Bld: 140 mg/dL — ABNORMAL HIGH (ref 65–99)
HCT: 28 % — ABNORMAL LOW (ref 36.0–46.0)
HEMATOCRIT: 30 % — AB (ref 36.0–46.0)
HEMOGLOBIN: 10.2 g/dL — AB (ref 12.0–15.0)
Hemoglobin: 9.5 g/dL — ABNORMAL LOW (ref 12.0–15.0)
POTASSIUM: 4.4 mmol/L (ref 3.5–5.1)
Potassium: 4.2 mmol/L (ref 3.5–5.1)
SODIUM: 138 mmol/L (ref 135–145)
Sodium: 137 mmol/L (ref 135–145)

## 2016-02-27 LAB — BASIC METABOLIC PANEL
Anion gap: 8 (ref 5–15)
BUN: 9 mg/dL (ref 6–20)
CALCIUM: 8.3 mg/dL — AB (ref 8.9–10.3)
CHLORIDE: 104 mmol/L (ref 101–111)
CO2: 24 mmol/L (ref 22–32)
CREATININE: 0.77 mg/dL (ref 0.44–1.00)
GFR calc Af Amer: >60 mL/min (ref >60)
GFR calc non Af Amer: >60 mL/min (ref >60)
Glucose, Bld: 123 mg/dL — ABNORMAL HIGH (ref 65–99)
Potassium: 3.9 mmol/L (ref 3.5–5.1)
SODIUM: 136 mmol/L (ref 135–145)

## 2016-02-27 LAB — CBC
HCT: 30.6 % — ABNORMAL LOW (ref 36.0–46.0)
HCT: 26.9 % — ABNORMAL LOW (ref 36.0–46.0)
HEMOGLOBIN: 9.3 g/dL — AB (ref 12.0–15.0)
HEMOGLOBIN: 8.3 g/dL — AB (ref 12.0–15.0)
MCH: 23.8 pg — AB (ref 26.0–34.0)
MCH: 24.5 pg — ABNORMAL LOW (ref 26.0–34.0)
MCHC: 30.4 g/dL (ref 30.0–36.0)
MCHC: 30.9 g/dL (ref 30.0–36.0)
MCV: 78.5 fL (ref 78.0–100.0)
MCV: 79.4 fL (ref 78.0–100.0)
PLATELETS: 123 10*3/uL — AB (ref 150–400)
Platelets: 135 10*3/uL — ABNORMAL LOW (ref 150–400)
RBC: 3.9 MIL/uL (ref 3.87–5.11)
RBC: 3.39 MIL/uL — AB (ref 3.87–5.11)
RDW: 18 % — AB (ref 11.5–15.5)
RDW: 18.3 % — ABNORMAL HIGH (ref 11.5–15.5)
WBC: 5.7 10*3/uL (ref 4.0–10.5)
WBC: 4.8 10*3/uL (ref 4.0–10.5)

## 2016-02-27 LAB — SURGICAL PCR SCREEN
MRSA, PCR: NEGATIVE
Staphylococcus aureus: POSITIVE — AB

## 2016-02-27 LAB — PROTIME-INR
INR: 1.14 (ref 0.00–1.49)
PROTHROMBIN TIME: 14.8 s (ref 11.6–15.2)

## 2016-02-27 LAB — PREPARE RBC (CROSSMATCH)

## 2016-02-27 LAB — APTT: APTT: 30 s (ref 24–37)

## 2016-02-27 LAB — ABO/RH: ABO/RH(D): O POS

## 2016-02-27 SURGERY — OPEN REDUCTION INTERNAL FIXATION (ORIF) ACETABULAR FRACTURE
Anesthesia: General | Site: Knee | Laterality: Right

## 2016-02-27 MED ORDER — ROCURONIUM BROMIDE 100 MG/10ML IV SOLN
INTRAVENOUS | Status: DC | PRN
  Administered 2016-02-27: 30 mg via INTRAVENOUS
  Administered 2016-02-27: 20 mg via INTRAVENOUS
  Administered 2016-02-27: 100 mg via INTRAVENOUS
  Administered 2016-02-27 (×3): 20 mg via INTRAVENOUS
  Administered 2016-02-27 (×3): 30 mg via INTRAVENOUS

## 2016-02-27 MED ORDER — PROPOFOL 10 MG/ML IV BOLUS
INTRAVENOUS | Status: DC | PRN
  Administered 2016-02-27: 140 mg via INTRAVENOUS

## 2016-02-27 MED ORDER — SUCCINYLCHOLINE CHLORIDE 20 MG/ML IJ SOLN
INTRAMUSCULAR | Status: DC | PRN
  Administered 2016-02-27: 100 mg via INTRAVENOUS

## 2016-02-27 MED ORDER — FENTANYL CITRATE (PF) 100 MCG/2ML IJ SOLN
50.0000 ug | Freq: Once | INTRAMUSCULAR | Status: AC
  Administered 2016-02-27: 50 ug via INTRAVENOUS
  Filled 2016-02-27: qty 2

## 2016-02-27 MED ORDER — OXYMETAZOLINE HCL 0.05 % NA SOLN
NASAL | Status: DC | PRN
  Administered 2016-02-27: 2 via NASAL

## 2016-02-27 MED ORDER — PHENYLEPHRINE HCL 10 MG/ML IJ SOLN
0.0000 ug/min | INTRAVENOUS | Status: DC
  Administered 2016-02-27: 35 ug/min via INTRAVENOUS
  Filled 2016-02-27: qty 1

## 2016-02-27 MED ORDER — FENTANYL CITRATE (PF) 250 MCG/5ML IJ SOLN
INTRAMUSCULAR | Status: AC
  Filled 2016-02-27: qty 5

## 2016-02-27 MED ORDER — PROPOFOL 1000 MG/100ML IV EMUL
5.0000 ug/kg/min | INTRAVENOUS | Status: DC
  Administered 2016-02-28: 40 ug/kg/min via INTRAVENOUS
  Administered 2016-02-28: 45 ug/kg/min via INTRAVENOUS
  Administered 2016-02-28: 40 ug/kg/min via INTRAVENOUS
  Filled 2016-02-27 (×4): qty 100

## 2016-02-27 MED ORDER — SODIUM CHLORIDE 0.9% FLUSH
10.0000 mL | INTRAVENOUS | Status: DC | PRN
  Administered 2016-03-03 (×2): 10 mL
  Filled 2016-02-27 (×2): qty 40

## 2016-02-27 MED ORDER — 0.9 % SODIUM CHLORIDE (POUR BTL) OPTIME
TOPICAL | Status: DC | PRN
  Administered 2016-02-27: 1000 mL

## 2016-02-27 MED ORDER — LACTATED RINGERS IV SOLN
INTRAVENOUS | Status: DC | PRN
  Administered 2016-02-27 (×3): via INTRAVENOUS

## 2016-02-27 MED ORDER — CEFAZOLIN SODIUM-DEXTROSE 2-4 GM/100ML-% IV SOLN
2.0000 g | Freq: Three times a day (TID) | INTRAVENOUS | Status: DC
  Administered 2016-02-28 – 2016-03-01 (×8): 2 g via INTRAVENOUS
  Filled 2016-02-27 (×11): qty 100

## 2016-02-27 MED ORDER — PROPOFOL 500 MG/50ML IV EMUL
INTRAVENOUS | Status: DC | PRN
  Administered 2016-02-27: 50 ug/kg/min via INTRAVENOUS

## 2016-02-27 MED ORDER — MEPERIDINE HCL 25 MG/ML IJ SOLN: 6.2500 mg | INTRAMUSCULAR | Status: DC | PRN

## 2016-02-27 MED ORDER — PROPOFOL 500 MG/50ML IV EMUL: INTRAVENOUS | Status: DC | PRN

## 2016-02-27 MED ORDER — DEXTROSE 5 % IV SOLN
3.0000 g | INTRAVENOUS | Status: AC
Start: 2016-02-27 — End: 2016-02-27
  Administered 2016-02-27 (×2): 3 g via INTRAVENOUS
  Filled 2016-02-27: qty 3000

## 2016-02-27 MED ORDER — SODIUM CHLORIDE 0.9 % IV SOLN
0.0000 ug/h | INTRAVENOUS | Status: DC
  Administered 2016-02-27: 50 ug/h via INTRAVENOUS
  Filled 2016-02-27: qty 50

## 2016-02-27 MED ORDER — LIDOCAINE 2% (20 MG/ML) 5 ML SYRINGE
INTRAMUSCULAR | Status: AC
  Filled 2016-02-27: qty 5

## 2016-02-27 MED ORDER — MIDAZOLAM HCL 2 MG/2ML IJ SOLN
INTRAMUSCULAR | Status: AC
  Filled 2016-02-27: qty 2

## 2016-02-27 MED ORDER — SODIUM CHLORIDE 0.9 % IR SOLN
Status: DC | PRN
Start: 2016-02-27 — End: 2016-02-27
  Administered 2016-02-27 (×2): 3000 mL

## 2016-02-27 MED ORDER — SODIUM CHLORIDE 0.9 % IV SOLN: Freq: Once | INTRAVENOUS | Status: DC

## 2016-02-27 MED ORDER — MIDAZOLAM HCL 5 MG/5ML IJ SOLN
INTRAMUSCULAR | Status: DC | PRN
  Administered 2016-02-27: 2 mg via INTRAVENOUS

## 2016-02-27 MED ORDER — PROPOFOL 1000 MG/100ML IV EMUL
INTRAVENOUS | Status: AC
  Administered 2016-02-27: 1000 mg
  Filled 2016-02-27: qty 100

## 2016-02-27 MED ORDER — CHLORHEXIDINE GLUCONATE 0.12% ORAL RINSE (MEDLINE KIT)
15.0000 mL | Freq: Two times a day (BID) | OROMUCOSAL | Status: DC
  Administered 2016-02-28 (×2): 15 mL via OROMUCOSAL

## 2016-02-27 MED ORDER — ALBUMIN HUMAN 5 % IV SOLN
INTRAVENOUS | Status: DC | PRN
  Administered 2016-02-27 (×2): via INTRAVENOUS

## 2016-02-27 MED ORDER — ANTISEPTIC ORAL RINSE SOLUTION (CORINZ)
7.0000 mL | OROMUCOSAL | Status: DC
  Administered 2016-02-28 (×4): 7 mL via OROMUCOSAL

## 2016-02-27 MED ORDER — LACTATED RINGERS IV SOLN
INTRAVENOUS | Status: DC
  Administered 2016-02-27 (×3): via INTRAVENOUS

## 2016-02-27 MED ORDER — SUCCINYLCHOLINE 20MG/ML (10ML) SYRINGE FOR MEDFUSION PUMP - OPTIME: INTRAMUSCULAR | Status: DC | PRN

## 2016-02-27 MED ORDER — METOCLOPRAMIDE HCL 5 MG/ML IJ SOLN: 10.0000 mg | Freq: Once | INTRAMUSCULAR | Status: DC | PRN

## 2016-02-27 MED ORDER — PHENYLEPHRINE HCL 10 MG/ML IJ SOLN
INTRAMUSCULAR | Status: DC | PRN
  Administered 2016-02-27: 80 ug via INTRAVENOUS

## 2016-02-27 MED ORDER — DEXTROSE 5 % IV SOLN
10.0000 mg | INTRAVENOUS | Status: DC | PRN
  Administered 2016-02-27: 20 ug/min via INTRAVENOUS

## 2016-02-27 MED ORDER — PROPOFOL 10 MG/ML IV BOLUS
INTRAVENOUS | Status: AC
  Filled 2016-02-27: qty 40

## 2016-02-27 MED ORDER — SODIUM CHLORIDE 0.9% FLUSH
10.0000 mL | Freq: Two times a day (BID) | INTRAVENOUS | Status: DC
  Administered 2016-02-27 – 2016-02-29 (×6): 10 mL
  Administered 2016-03-01 – 2016-03-02 (×4): 20 mL
  Administered 2016-03-03 – 2016-03-04 (×2): 10 mL
  Administered 2016-03-07: 40 mL

## 2016-02-27 MED ORDER — LIDOCAINE HCL (PF) 4 % IJ SOLN
INTRAMUSCULAR | Status: DC | PRN
  Administered 2016-02-27: 4 mL via RESPIRATORY_TRACT

## 2016-02-27 MED ORDER — HYDROMORPHONE HCL 1 MG/ML IJ SOLN
1.0000 mg | Freq: Once | INTRAMUSCULAR | Status: AC
  Administered 2016-02-27: 1 mg via INTRAVENOUS

## 2016-02-27 MED ORDER — FENTANYL CITRATE (PF) 100 MCG/2ML IJ SOLN: 25.0000 ug | INTRAMUSCULAR | Status: DC | PRN

## 2016-02-27 MED ORDER — MUPIROCIN 2 % EX OINT
1.0000 "application " | TOPICAL_OINTMENT | Freq: Two times a day (BID) | CUTANEOUS | Status: AC
  Administered 2016-02-27 – 2016-03-02 (×8): 1 via NASAL
  Filled 2016-02-27 (×4): qty 22

## 2016-02-27 MED ORDER — CHLORHEXIDINE GLUCONATE CLOTH 2 % EX PADS
6.0000 | MEDICATED_PAD | Freq: Every day | CUTANEOUS | Status: AC
  Administered 2016-02-27 – 2016-03-02 (×5): 6 via TOPICAL

## 2016-02-27 MED ORDER — FENTANYL CITRATE (PF) 100 MCG/2ML IJ SOLN
INTRAMUSCULAR | Status: DC | PRN
  Administered 2016-02-27: 50 ug via INTRAVENOUS
  Administered 2016-02-27: 100 ug via INTRAVENOUS
  Administered 2016-02-27 (×3): 50 ug via INTRAVENOUS
  Administered 2016-02-27: 100 ug via INTRAVENOUS
  Administered 2016-02-27 (×2): 50 ug via INTRAVENOUS
  Administered 2016-02-27: 100 ug via INTRAVENOUS
  Administered 2016-02-27: 50 ug via INTRAVENOUS

## 2016-02-27 SURGICAL SUPPLY — 111 items
APPLIER CLIP 11 MED OPEN (CLIP)
APR CLP MED 11 20 MLT OPN (CLIP)
BANDAGE ELASTIC 4 VELCRO ST LF (GAUZE/BANDAGES/DRESSINGS) ×2 IMPLANT
BIT DRILL AO MATTA 2.5MX230M (BIT) IMPLANT
BIT DRILL TWST MATTA 3.5MX195M (BIT) IMPLANT
BLADE SURG 10 STRL SS (BLADE) ×4 IMPLANT
BLADE SURG ROTATE 9660 (MISCELLANEOUS) IMPLANT
BNDG COHESIVE 4X5 TAN STRL (GAUZE/BANDAGES/DRESSINGS) ×2 IMPLANT
BNDG GAUZE ELAST 4 BULKY (GAUZE/BANDAGES/DRESSINGS) ×6 IMPLANT
BNDG GAUZE STRTCH 6 (GAUZE/BANDAGES/DRESSINGS) ×6 IMPLANT
BRUSH SCRUB DISP (MISCELLANEOUS) ×8 IMPLANT
BRUSH SCRUB EZ PLAIN DRY (MISCELLANEOUS) ×6 IMPLANT
CLIP APPLIE 11 MED OPEN (CLIP) IMPLANT
CLOSURE WOUND 1/2 X4 (GAUZE/BANDAGES/DRESSINGS)
COVER SURGICAL LIGHT HANDLE (MISCELLANEOUS) ×8 IMPLANT
DRAIN CHANNEL 10F 3/8 F FF (DRAIN) IMPLANT
DRAIN CHANNEL 15F RND FF W/TCR (WOUND CARE) IMPLANT
DRAPE C-ARM 42X72 X-RAY (DRAPES) IMPLANT
DRAPE C-ARMOR (DRAPES) ×2 IMPLANT
DRAPE IMP U-DRAPE 54X76 (DRAPES) ×6 IMPLANT
DRAPE INCISE IOBAN 66X45 STRL (DRAPES) IMPLANT
DRAPE INCISE IOBAN 85X60 (DRAPES) ×8 IMPLANT
DRAPE ORTHO SPLIT 77X108 STRL (DRAPES) ×8
DRAPE SURG ORHT 6 SPLT 77X108 (DRAPES) ×4 IMPLANT
DRAPE U-SHAPE 47X51 STRL (DRAPES) ×4 IMPLANT
DRILL BIT AO MATTA 2.5MX230M (BIT) ×4
DRILL TWIST AO MATTA 3.5MX195M (BIT) ×4
DRSG ADAPTIC 3X8 NADH LF (GAUZE/BANDAGES/DRESSINGS) ×4 IMPLANT
DRSG MEPILEX BORDER 4X12 (GAUZE/BANDAGES/DRESSINGS) ×2 IMPLANT
DRSG MEPILEX BORDER 4X8 (GAUZE/BANDAGES/DRESSINGS) ×2 IMPLANT
ELECT BLADE 6.5 EXT (BLADE) ×2 IMPLANT
ELECT CAUTERY BLADE 6.4 (BLADE) IMPLANT
ELECT REM PT RETURN 9FT ADLT (ELECTROSURGICAL) ×4
ELECTRODE REM PT RTRN 9FT ADLT (ELECTROSURGICAL) ×2 IMPLANT
EVACUATOR 1/8 PVC DRAIN (DRAIN) IMPLANT
EVACUATOR SILICONE 100CC (DRAIN) ×4 IMPLANT
FACESHIELD WRAPAROUND (MASK) ×4 IMPLANT
FACESHIELD WRAPAROUND OR TEAM (MASK) IMPLANT
GAUZE SPONGE 4X4 12PLY STRL (GAUZE/BANDAGES/DRESSINGS) ×4 IMPLANT
GAUZE SPONGE 4X4 16PLY XRAY LF (GAUZE/BANDAGES/DRESSINGS) ×4 IMPLANT
GLOVE BIO SURGEON STRL SZ7.5 (GLOVE) ×6 IMPLANT
GLOVE BIO SURGEON STRL SZ8 (GLOVE) ×6 IMPLANT
GLOVE BIOGEL PI IND STRL 7.5 (GLOVE) ×2 IMPLANT
GLOVE BIOGEL PI IND STRL 8 (GLOVE) ×2 IMPLANT
GLOVE BIOGEL PI INDICATOR 7.5 (GLOVE) ×4
GLOVE BIOGEL PI INDICATOR 8 (GLOVE) ×4
GOWN STRL REUS W/ TWL LRG LVL3 (GOWN DISPOSABLE) ×4 IMPLANT
GOWN STRL REUS W/ TWL XL LVL3 (GOWN DISPOSABLE) ×2 IMPLANT
GOWN STRL REUS W/TWL 2XL LVL3 (GOWN DISPOSABLE) ×4 IMPLANT
GOWN STRL REUS W/TWL LRG LVL3 (GOWN DISPOSABLE) ×12
GOWN STRL REUS W/TWL XL LVL3 (GOWN DISPOSABLE) ×8
HANDPIECE INTERPULSE COAX TIP (DISPOSABLE) ×4
KIT BASIN OR (CUSTOM PROCEDURE TRAY) ×4 IMPLANT
KIT ROOM TURNOVER OR (KITS) ×4 IMPLANT
LIGHT ORTHO (MISCELLANEOUS) ×4 IMPLANT
LIGHT WAVEGUIDE WIDE FLAT (MISCELLANEOUS) ×2 IMPLANT
LOOP VESSEL MAXI BLUE (MISCELLANEOUS) IMPLANT
MANIFOLD NEPTUNE II (INSTRUMENTS) ×4 IMPLANT
NDL MAYO TROCAR (NEEDLE) ×2 IMPLANT
NEEDLE MAYO TROCAR (NEEDLE) IMPLANT
NS IRRIG 1000ML POUR BTL (IV SOLUTION) ×4 IMPLANT
PACK ORTHO EXTREMITY (CUSTOM PROCEDURE TRAY) ×4 IMPLANT
PACK TOTAL JOINT (CUSTOM PROCEDURE TRAY) ×2 IMPLANT
PACK UNIVERSAL I (CUSTOM PROCEDURE TRAY) ×4 IMPLANT
PAD ARMBOARD 7.5X6 YLW CONV (MISCELLANEOUS) ×8 IMPLANT
PADDING CAST COTTON 6X4 STRL (CAST SUPPLIES) ×2 IMPLANT
PIPE LIGHT STORZ FITTING (MISCELLANEOUS) ×2 IMPLANT
PLATE ACET STRT 70.5M 6H (Plate) ×2 IMPLANT
PLATE ACET STRT 94.5M 8H (Plate) ×4 IMPLANT
RETRIEVER SUT HEWSON (MISCELLANEOUS) IMPLANT
SCREW CORTEX ST MATTA 3.5X16MM (Screw) ×2 IMPLANT
SCREW CORTEX ST MATTA 3.5X18MM (Screw) ×6 IMPLANT
SCREW CORTEX ST MATTA 3.5X20 (Screw) ×2 IMPLANT
SCREW CORTEX ST MATTA 3.5X22MM (Screw) ×2 IMPLANT
SCREW CORTEX ST MATTA 3.5X26MM (Screw) ×2 IMPLANT
SCREW CORTEX ST MATTA 3.5X28MM (Screw) ×4 IMPLANT
SCREW CORTEX ST MATTA 3.5X32MM (Screw) ×2 IMPLANT
SCREW CORTEX ST MATTA 3.5X34MM (Screw) ×2 IMPLANT
SCREW CORTEX ST MATTA 3.5X36MM (Screw) ×4 IMPLANT
SCREW CORTEX ST MATTA 3.5X40MM (Screw) ×8 IMPLANT
SCREW CORTEX ST MATTA 3.5X55MM (Screw) ×2 IMPLANT
SET HNDPC FAN SPRY TIP SCT (DISPOSABLE) IMPLANT
SPONGE GAUZE 4X4 12PLY STER LF (GAUZE/BANDAGES/DRESSINGS) ×2 IMPLANT
SPONGE LAP 18X18 X RAY DECT (DISPOSABLE) ×12 IMPLANT
STAPLER VISISTAT 35W (STAPLE) ×4 IMPLANT
STOCKINETTE IMPERVIOUS 9X36 MD (GAUZE/BANDAGES/DRESSINGS) ×2 IMPLANT
STRIP CLOSURE SKIN 1/2X4 (GAUZE/BANDAGES/DRESSINGS) ×2 IMPLANT
SUCTION FRAZIER HANDLE 10FR (MISCELLANEOUS) ×2
SUCTION TUBE FRAZIER 10FR DISP (MISCELLANEOUS) ×2 IMPLANT
SUT ETHILON 2 0 PSLX (SUTURE) ×6 IMPLANT
SUT FIBERWIRE #2 38 T-5 BLUE (SUTURE)
SUT FIBERWIRE 2-0 18 17.9 3/8 (SUTURE) ×12
SUT PDS AB 2-0 CT1 27 (SUTURE) ×4 IMPLANT
SUT VIC AB 0 CT1 27 (SUTURE) ×12
SUT VIC AB 0 CT1 27XBRD ANBCTR (SUTURE) ×2 IMPLANT
SUT VIC AB 1 CT1 18XCR BRD 8 (SUTURE) ×2 IMPLANT
SUT VIC AB 1 CT1 8-18 (SUTURE)
SUT VIC AB 1 CTX 18 (SUTURE) ×4 IMPLANT
SUT VIC AB 2-0 CT1 27 (SUTURE)
SUT VIC AB 2-0 CT1 TAPERPNT 27 (SUTURE) ×2 IMPLANT
SUTURE FIBERWR #2 38 T-5 BLUE (SUTURE) IMPLANT
SUTURE FIBERWR 2-0 18 17.9 3/8 (SUTURE) IMPLANT
TOWEL OR 17X24 6PK STRL BLUE (TOWEL DISPOSABLE) ×4 IMPLANT
TOWEL OR 17X26 10 PK STRL BLUE (TOWEL DISPOSABLE) ×8 IMPLANT
TRAY FOLEY CATH 16FRSI W/METER (SET/KITS/TRAYS/PACK) IMPLANT
TUBE ANAEROBIC SPECIMEN COL (MISCELLANEOUS) IMPLANT
TUBE CONNECTING 12'X1/4 (SUCTIONS) ×1
TUBE CONNECTING 12X1/4 (SUCTIONS) ×3 IMPLANT
UNDERPAD 30X30 INCONTINENT (UNDERPADS AND DIAPERS) ×4 IMPLANT
WATER STERILE IRR 1000ML POUR (IV SOLUTION) ×4 IMPLANT
YANKAUER SUCT BULB TIP NO VENT (SUCTIONS) ×4 IMPLANT

## 2016-02-27 NOTE — Progress Notes (Signed)
Patient intermittently moaning in pain, however patient is also falling asleep and snoring while asleep. She is maintaining her airway and SpO2 while sleeping remains easily arousable. Will continue to monitor.

## 2016-02-27 NOTE — Progress Notes (Signed)
Patient came down from ICU NSR on monitor. BP 136/71 SpO2 96% on 2 lpm. Patient intermittently sleeping but arousable on calling.
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
## 2016-02-27 NOTE — Progress Notes (Signed)
Pt administered PRN pain meds per order. Pt complaining of R shoulder pain. Notified Trauma MD Dwain SarnaWakefield. Orders given for 1x prn dose and right shoulder xray. Will continue to monitor.

## 2016-02-27 NOTE — Anesthesia Procedure Notes (Addendum)
Procedure Name: Awake intubation Date/Time: 02/27/2016 3:46 PM Performed by: Bobbie StackANDERSON, Xoie Kreuser KIRSTEN Pre-anesthesia Checklist: Patient identified, Emergency Drugs available, Suction available, Timeout performed and Patient being monitored Patient Re-evaluated:Patient Re-evaluated prior to inductionOxygen Delivery Method: Circle system utilized Preoxygenation: Pre-oxygenation with 100% oxygen Intubation Type: IV induction Grade View: Grade I Tube type: Parker flex tip Nasal Tubes: Right Tube size: 7.0 mm Number of attempts: 1 Airway Equipment and Method: Fiberoptic brochoscope Placement Confirmation: ETT inserted through vocal cords under direct vision,  positive ETCO2 and breath sounds checked- equal and bilateral Secured at: 26 cm Tube secured with: Tape Dental Injury: Teeth and Oropharynx as per pre-operative assessment  Difficulty Due To: Difficulty was anticipated, Difficult Airway- due to cervical collar, Difficult Airway-  due to neck instability and Difficult Airway- due to limited oral opening Comments: Awake fiberoptic intubation performed by Dr. Malen GauzeFoster. Head and neck remained neutral.
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
## 2016-02-27 NOTE — Progress Notes (Signed)
Peripherally Inserted Central Catheter/Midline Placement  The IV Nurse has discussed with the patient and/or persons authorized to consent for the patient, the purpose of this procedure and the potential benefits and risks involved with this procedure.  The benefits include less needle sticks, lab draws from the catheter and patient may be discharged home with the catheter.  Risks include, but not limited to, infection, bleeding, blood clot (thrombus formation), and puncture of an artery; nerve damage and irregular heat beat.  Alternatives to this procedure were also discussed.  PICC/Midline Placement Documentation  PICC Double Lumen 02/27/16 PICC Left Basilic 48 cm 0 cm (Active)  Indication for Insertion or Continuance of Line Prolonged intravenous therapies 02/27/2016  9:00 AM  Exposed Catheter (cm) 0 cm 02/27/2016  9:00 AM  Dressing Change Due 03/05/16 02/27/2016  9:00 AM       Eileen [name], Eileen [name] 02/27/2016, 9:44 AM

## 2016-02-27 NOTE — Anesthesia Preprocedure Evaluation (Addendum)
Anesthesia Evaluation  Patient identified by MRN, date of birth, ID band  Airway Mallampati: IV   Neck ROM: Limited   Comment: Cervical collar in place Dental  (+) Teeth Intact   Pulmonary Current Smoker,    Pulmonary exam normal breath sounds clear to auscultation       Cardiovascular negative cardio ROS   Rhythm:Regular Rate:Normal     Neuro/Psych  C-spine not cleared negative psych ROS   GI/Hepatic negative GI ROS, Neg liver ROS,   Endo/Other  Morbid obesity  Renal/GU negative Renal ROS  negative genitourinary   Musculoskeletal Multiple C-Spine Fx's ? Focal epidural Hematoma C6 with narrowing of spinal canal   Abdominal (+) + obese,   Peds  Hematology  (+) anemia , HIV (+)   Anesthesia Other Findings   Reproductive/Obstetrics negative OB ROS                        Anesthesia Physical Anesthesia Plan  ASA: III  Anesthesia Plan: General   Post-op Pain Management:    Induction: Intravenous and Rapid sequence  Airway Management Planned: Video Laryngoscope Planned and Fiberoptic Intubation Planned  Additional Equipment: Arterial line  Intra-op Plan:   Post-operative Plan: Post-operative intubation/ventilation  Informed Consent: I have reviewed the patients History and Physical, chart, labs and discussed the procedure including the risks, benefits and alternatives for the proposed anesthesia with the patient or authorized representative who has indicated his/her understanding and acceptance.   Dental advisory given  Plan Discussed with: CRNA, Anesthesiologist and Surgeon  Anesthesia Plan Comments: (Will use Fiberoptic for intubation with manual inline cervical traction. Discussed with Dr. Conchita ParisNundkumar regarding unstable C-Spine Fx. No operative intervention needed for her C-Spine Fx's. Prone positoning planned for acetabular Fx. Discussed with Dr. Carola FrostHandy.)     Anesthesia Quick  Evaluation

## 2016-02-27 NOTE — Transfer of Care (Signed)
Immediate Anesthesia Transfer of Care Note  Patient: Eileen [name]  Procedure(s) Performed: Procedure(s): OPEN REDUCTION INTERNAL FIXATION (ORIF) ACETABULAR FRACTURE (Left) IRRIGATION AND DEBRIDEMENT RIGHT KNEE (Right)  Patient Location: ICU  Anesthesia Type:General  Level of Consciousness: unresponsive and Patient remains intubated per anesthesia plan  Airway & Oxygen Therapy: Patient remains intubated per anesthesia plan and Patient placed on Ventilator (see vital sign flow sheet for setting)  Post-op Assessment: Report given to RN and Post -op Vital signs reviewed and stable  Post vital signs: Reviewed and stable  Last Vitals:  Filed Vitals:   02/27/16 1200 02/27/16 1300  BP: 115/64 132/83  Pulse: 88 98  Temp: 37.4 C 37.5 C  Resp: 15 26    Last Pain:  Filed Vitals:   02/27/16 1558  PainSc: Asleep         Complications: No apparent anesthesia complications

## 2016-02-27 NOTE — Progress Notes (Signed)
Orthopaedic Trauma Service Progress Note  Case discussed with Dr. Ninetta LightsHatcher, he will come by to see pt. Much appreciated  Also discussed with Radiation oncology. Weight limit of Treatment bed is 440 lbs.  pts current weight today is 391 lbs. Will be able to do XRT for HO prophylaxis once surgery complete. Suspect we will proceed with intra-pelvic fixation today and posterior fixation Tuesday or Thursday   + cocaine on tox screen + HIV   Type and screen completed  Will type and cross 2 units for OR today     CBC    Component Value Date/Time   WBC 4.8 02/27/2016 0945   RBC 3.39* 02/27/2016 0945   HGB 8.3* 02/27/2016 0945   HCT 26.9* 02/27/2016 0945   PLT 123* 02/27/2016 0945   MCV 79.4 02/27/2016 0945   MCH 24.5* 02/27/2016 0945   MCHC 30.9 02/27/2016 0945   RDW 18.3* 02/27/2016 0945    Significant risk for periop complications and long term dysfunction given medical hx, social history and injury Will order metabolic bone labs given history of anti-retroviral medication use   Eileen LatinKeith W. Judd Mccubbin, PA-C [facility] [phone] (P) 02/27/2016 10:51 AM

## 2016-02-28 ENCOUNTER — Encounter (HOSPITAL_COMMUNITY): Payer: Self-pay | Admitting: Orthopedic Surgery

## 2016-02-28 ENCOUNTER — Telehealth: Payer: Self-pay | Admitting: *Deleted

## 2016-02-28 ENCOUNTER — Institutional Professional Consult (permissible substitution): Payer: Self-pay | Admitting: Radiation Oncology

## 2016-02-28 DIAGNOSIS — S12101A Unspecified nondisplaced fracture of second cervical vertebra, initial encounter for closed fracture: Secondary | ICD-10-CM

## 2016-02-28 LAB — CBC
HEMATOCRIT: 22.6 % — AB (ref 36.0–46.0)
Hemoglobin: 7 g/dL — ABNORMAL LOW (ref 12.0–15.0)
MCH: 24.7 pg — AB (ref 26.0–34.0)
MCHC: 31 g/dL (ref 30.0–36.0)
MCV: 79.9 fL (ref 78.0–100.0)
PLATELETS: 104 10*3/uL — AB (ref 150–400)
RBC: 2.83 MIL/uL — ABNORMAL LOW (ref 3.87–5.11)
RDW: 18.2 % — AB (ref 11.5–15.5)
WBC: 6.2 10*3/uL (ref 4.0–10.5)

## 2016-02-28 LAB — POCT I-STAT 3, ART BLOOD GAS (G3+)
Acid-Base Excess: 2 mmol/L (ref 0.0–2.0)
BICARBONATE: 28.2 meq/L — AB (ref 20.0–24.0)
O2 Saturation: 95 %
PO2 ART: 84 mmHg (ref 80.0–100.0)
TCO2: 30 mmol/L (ref 0–100)
pCO2 arterial: 50.3 mmHg — ABNORMAL HIGH (ref 35.0–45.0)
pH, Arterial: 7.359 (ref 7.350–7.450)

## 2016-02-28 LAB — URINE MICROSCOPIC-ADD ON

## 2016-02-28 LAB — LIPID PANEL
CHOL/HDL RATIO: 4.1 ratio
Cholesterol: 103 mg/dL (ref 0–200)
HDL: 25 mg/dL — AB (ref >40)
LDL CALC: 51 mg/dL (ref 0–99)
Triglycerides: 135 mg/dL (ref <150)
VLDL: 27 mg/dL (ref 0–40)

## 2016-02-28 LAB — URINE CULTURE: CULTURE: NO GROWTH

## 2016-02-28 LAB — T-HELPER CELLS (CD4) COUNT (NOT AT ARMC)
CD4 % Helper T Cell: 21 % — ABNORMAL LOW (ref 33–55)
CD4 T Cell Abs: 210 /uL — ABNORMAL LOW (ref 400–2700)

## 2016-02-28 LAB — URINALYSIS, ROUTINE W REFLEX MICROSCOPIC
Bilirubin Urine: NEGATIVE
GLUCOSE, UA: NEGATIVE mg/dL
HGB URINE DIPSTICK: NEGATIVE
KETONES UR: NEGATIVE mg/dL
LEUKOCYTES UA: NEGATIVE
Nitrite: NEGATIVE
PH: 5.5 (ref 5.0–8.0)
Protein, ur: NEGATIVE mg/dL
Specific Gravity, Urine: 1.027 (ref 1.005–1.030)

## 2016-02-28 LAB — VITAMIN D 25 HYDROXY (VIT D DEFICIENCY, FRACTURES): VIT D 25 HYDROXY: 14.5 ng/mL — AB (ref 30.0–100.0)

## 2016-02-28 LAB — HEMOGLOBIN A1C
HEMOGLOBIN A1C: 5.7 % — AB (ref 4.8–5.6)
MEAN PLASMA GLUCOSE: 117 mg/dL

## 2016-02-28 LAB — HEPATITIS PANEL, ACUTE
HCV Ab: 0.1 s/co ratio (ref 0.0–0.9)
HEP A IGM: NEGATIVE
HEP B C IGM: NEGATIVE
HEP B S AG: NEGATIVE

## 2016-02-28 LAB — TRIGLYCERIDES: Triglycerides: 148 mg/dL (ref <150)

## 2016-02-28 MED ORDER — SODIUM CHLORIDE 0.9% FLUSH: 9.0000 mL | INTRAVENOUS | Status: DC | PRN

## 2016-02-28 MED ORDER — DIPHENHYDRAMINE HCL 12.5 MG/5ML PO ELIX: 12.5000 mg | ORAL_SOLUTION | Freq: Four times a day (QID) | ORAL | Status: DC | PRN

## 2016-02-28 MED ORDER — ONDANSETRON HCL 4 MG/2ML IJ SOLN: 4.0000 mg | Freq: Four times a day (QID) | INTRAMUSCULAR | Status: DC | PRN

## 2016-02-28 MED ORDER — HYDROMORPHONE HCL 1 MG/ML IJ SOLN
1.0000 mg | INTRAMUSCULAR | Status: DC | PRN
  Administered 2016-02-28 (×2): 2 mg via INTRAVENOUS
  Filled 2016-02-28 (×2): qty 2

## 2016-02-28 MED ORDER — METHOCARBAMOL 750 MG PO TABS
750.0000 mg | ORAL_TABLET | Freq: Three times a day (TID) | ORAL | Status: DC | PRN
  Administered 2016-03-03 – 2016-03-06 (×8): 750 mg via ORAL
  Filled 2016-02-28 (×8): qty 1

## 2016-02-28 MED ORDER — DIPHENHYDRAMINE HCL 50 MG/ML IJ SOLN: 12.5000 mg | Freq: Four times a day (QID) | INTRAMUSCULAR | Status: DC | PRN

## 2016-02-28 MED ORDER — NALOXONE HCL 0.4 MG/ML IJ SOLN: 0.4000 mg | INTRAMUSCULAR | Status: DC | PRN

## 2016-02-28 MED ORDER — OXYCODONE HCL 5 MG PO TABS
10.0000 mg | ORAL_TABLET | ORAL | Status: DC | PRN
  Administered 2016-02-28: 15 mg via ORAL
  Filled 2016-02-28 (×2): qty 3

## 2016-02-28 MED ORDER — HYDROMORPHONE 1 MG/ML IV SOLN
INTRAVENOUS | Status: DC
  Administered 2016-02-28: 3.3 mg via INTRAVENOUS
  Administered 2016-02-28: 15:00:00 via INTRAVENOUS
  Administered 2016-02-28: 2.2 mg via INTRAVENOUS
  Administered 2016-02-29: 2.4 mg via INTRAVENOUS
  Administered 2016-02-29: 1.5 mg via INTRAVENOUS
  Administered 2016-02-29: 2.7 mg via INTRAVENOUS
  Administered 2016-02-29: 2.1 mg via INTRAVENOUS
  Administered 2016-02-29: 5.1 mg via INTRAVENOUS
  Administered 2016-02-29: 1.2 mg via INTRAVENOUS
  Administered 2016-03-01: 1.5 mg via INTRAVENOUS
  Administered 2016-03-01: 1.2 mg via INTRAVENOUS
  Administered 2016-03-01: 3.6 mg via INTRAVENOUS
  Administered 2016-03-01: 3 mg via INTRAVENOUS
  Administered 2016-03-01: 10 mg via INTRAVENOUS
  Administered 2016-03-01: 1.5 mg via INTRAVENOUS
  Administered 2016-03-01: 3.6 mg via INTRAVENOUS
  Administered 2016-03-02: 1.5 mg via INTRAVENOUS
  Administered 2016-03-02: 2.7 mg via INTRAVENOUS
  Filled 2016-02-28 (×2): qty 25

## 2016-02-28 NOTE — Progress Notes (Signed)
Orthopaedic Trauma Service Progress Note  Subjective  C/o significant Left hip pain  Getting dilaudid q2h Denies numbness or tingling to legs and UExs  Admits to cocaine use on Friday States this was a one time thing and not a habit Denies additional drug use   ROS As above  Objective   BP 131/53 mmHg  Pulse 103  Temp(Src) 100.9 F (38.3 C) (Core (Comment))  Resp 23  Ht  (1.778 m)  Wt 177.356 kg (391 lb)  BMI 56.10 kg/m2  SpO2 94%  LMP 02/12/2016 (Approximate)  Intake/Output      05/08 0701 - 05/09 0700 05/09 0701 - 05/10 0700   I.V. (mL/kg) 5025.5 (28.3) 42.3 (0.2)   Blood 277    IV Piggyback 600    Total Intake(mL/kg) 5902.5 (33.3) 42.3 (0.2)   Urine (mL/kg/hr) 1620 (0.4) 115 (0.2)   Drains 40 (0)    Blood 550 (0.1)    Total Output 2210 115   Net +3692.5 -72.7          Labs  CBC    Component Value Date/Time   WBC 4.8 02/27/2016 0945   RBC 3.39* 02/27/2016 0945   HGB 9.5* 02/27/2016 2031   HCT 28.0* 02/27/2016 2031   PLT 123* 02/27/2016 0945   MCV 79.4 02/27/2016 0945   MCH 24.5* 02/27/2016 0945   MCHC 30.9 02/27/2016 0945   RDW 18.3* 02/27/2016 0945      Exam  Gen: somnolent but arousable, in bed, c-collar in place Abd: + BS, NTND Pelvis: dressing L hip stable  Ext:       Left Lower Extremity   Dressing stable  Ext warm  + DP pulse  EHL, , FHL, AT, PT, peroneals, gastroc motor intact  DPN, SPN, TN sensation intact        Right Lower Extremity   Dressing stable   JP drain patent   Motor and sensory functions intact   + DP pulse   Ext warm      B Upper Extremities  Motor and sensory functions grossly intact  Ext warm   + DP pulse     Assessment and Plan   POD/HD#: 1   -Left T-type posterior wall acetabulum fracture dislocation s/p ORIF  TDWB x 8 weeks  Suspect pt will really be bed to chair for that time  Posterior hip precautions L hip x 12 weeks  PT/OT evals   Ice prn    Will need [facility]RT at [facility] for HO prophylaxis  in the next 2 days  Consult made, will communicate again with radiation oncology team    -R knee complex laceration   Continue IV abx for another 48 hrs  Dc drain tomorrow or Thursday   WBAT R leg  No ROM restrictions- extensor mechanism intact, no joint involvement  - Pain management:  Per TS  Suspect will be a challenge   - ABL anemia/Hemodynamics  Cbc in am   - Medical issues   + HIV   ID to come see    Dr. Ninetta Lights very familiar with pt    Cocaine abuse   Admitted to use on Friday   Serum tox screen pending   - DVT/PE prophylaxis:  Will need anticoagulation if ok with neurosurgery  8 weeks of tx, pt at very high risk for DVT and PE  - ID:   Ancef x 48 more hours for open wound R knee   - Metabolic Bone Disease:  Prelim vitamin D is  low  Check additional labs  Will start vitamin D supplement one pt taking regular diet   - Activity:  PT/OT Evals  As above   - FEN/GI prophylaxis/Foley/Lines:  Liquid diet for now  Pt with good BS today  Monitor for ileus   -impediments to fracture healing  Obesity  Substance abuse  HIV- however pt has been off anti-retrovirals for some time now   Vitamin d deficiency   - Dispo:  PT/OT evals     Mearl LatinKeith W. Anvay Tennis, PA-C [facility] [phone](P) [phone] (O) 02/28/2016 9:53 AM
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
## 2016-02-28 NOTE — Progress Notes (Addendum)
Set up appointment with [facility] to be here by 1130am tomorrow to take pt to [facility].

## 2016-02-28 NOTE — Procedures (Signed)
Extubation Procedure Note  Patient Details:   Name: Eileen [name] DOB: August 18, 1986 MRN: [id number]   Airway Documentation:     Evaluation  O2 sats: stable throughout Complications: No apparent complications Patient did tolerate procedure well. Bilateral Breath Sounds: Clear, Diminished   Yes   Patient extubated to 4lnc. Vital signs stable. No complications. RN at bedside. RT will continue to monitor.  Ave Filterdkins, Alessander Sikorski Williams 02/28/2016, 8:36 AM

## 2016-02-28 NOTE — Progress Notes (Signed)
Wasted 115cc of Fentanyl gtt with Lennie MuckleBethany Culbertson, RN
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
## 2016-02-29 ENCOUNTER — Ambulatory Visit
Admit: 2016-02-29 | Discharge: 2016-02-29 | Disposition: A | Discharge status: Home / Self Care | Payer: Medicaid Other | Attending: Radiation Oncology | Admitting: Radiation Oncology

## 2016-02-29 ENCOUNTER — Encounter: Payer: Self-pay | Admitting: *Deleted

## 2016-02-29 ENCOUNTER — Telehealth: Payer: Self-pay | Admitting: *Deleted

## 2016-02-29 DIAGNOSIS — Q6589 Other specified congenital deformities of hip: Secondary | ICD-10-CM

## 2016-02-29 DIAGNOSIS — M898X9 Other specified disorders of bone, unspecified site: Secondary | ICD-10-CM | POA: Insufficient documentation

## 2016-02-29 DIAGNOSIS — M9689 Other intraoperative and postprocedural complications and disorders of the musculoskeletal system: Principal | ICD-10-CM

## 2016-02-29 DIAGNOSIS — S32402A Unspecified fracture of left acetabulum, initial encounter for closed fracture: Secondary | ICD-10-CM | POA: Insufficient documentation

## 2016-02-29 DIAGNOSIS — M6140 Other calcification of muscle, unspecified site: Secondary | ICD-10-CM

## 2016-02-29 LAB — COMPREHENSIVE METABOLIC PANEL
ALBUMIN: 2.3 g/dL — AB (ref 3.5–5.0)
ALK PHOS: 44 U/L (ref 38–126)
ALT: 13 U/L — ABNORMAL LOW (ref 14–54)
ANION GAP: 8 (ref 5–15)
AST: 30 U/L (ref 15–41)
BUN: <5 mg/dL — ABNORMAL LOW (ref 6–20)
CALCIUM: 7.8 mg/dL — AB (ref 8.9–10.3)
CO2: 29 mmol/L (ref 22–32)
Chloride: 100 mmol/L — ABNORMAL LOW (ref 101–111)
Creatinine, Ser: 0.59 mg/dL (ref 0.44–1.00)
GFR calc Af Amer: >60 mL/min (ref >60)
GFR calc non Af Amer: >60 mL/min (ref >60)
GLUCOSE: 112 mg/dL — AB (ref 65–99)
POTASSIUM: 3.8 mmol/L (ref 3.5–5.1)
SODIUM: 137 mmol/L (ref 135–145)
Total Bilirubin: 0.8 mg/dL (ref 0.3–1.2)
Total Protein: 5.4 g/dL — ABNORMAL LOW (ref 6.5–8.1)

## 2016-02-29 LAB — PREALBUMIN: Prealbumin: 8.2 mg/dL — ABNORMAL LOW (ref 18–38)

## 2016-02-29 LAB — CBC WITH DIFFERENTIAL/PLATELET
Basophils Absolute: 0 10*3/uL (ref 0.0–0.1)
Basophils Relative: 0 %
Eosinophils Absolute: 0 10*3/uL (ref 0.0–0.7)
Eosinophils Relative: 0 %
HCT: 21.1 % — ABNORMAL LOW (ref 36.0–46.0)
HEMOGLOBIN: 6.4 g/dL — AB (ref 12.0–15.0)
LYMPHS ABS: 1 10*3/uL (ref 0.7–4.0)
LYMPHS PCT: 21 %
MCH: 24 pg — AB (ref 26.0–34.0)
MCHC: 30.3 g/dL (ref 30.0–36.0)
MCV: 79 fL (ref 78.0–100.0)
Monocytes Absolute: 0.4 10*3/uL (ref 0.1–1.0)
Monocytes Relative: 8 %
NEUTROS PCT: 71 %
Neutro Abs: 3.6 10*3/uL (ref 1.7–7.7)
PLATELETS: 113 10*3/uL — AB (ref 150–400)
RBC: 2.67 MIL/uL — AB (ref 3.87–5.11)
RDW: 18.3 % — AB (ref 11.5–15.5)
WBC: 5.1 10*3/uL (ref 4.0–10.5)

## 2016-02-29 LAB — GC/CHLAMYDIA PROBE AMP (~~LOC~~) NOT AT ARMC
Chlamydia: NEGATIVE
Neisseria Gonorrhea: NEGATIVE

## 2016-02-29 LAB — CBC
HCT: 23 % — ABNORMAL LOW (ref 36.0–46.0)
HEMOGLOBIN: 7 g/dL — AB (ref 12.0–15.0)
MCH: 24.2 pg — ABNORMAL LOW (ref 26.0–34.0)
MCHC: 30.4 g/dL (ref 30.0–36.0)
MCV: 79.6 fL (ref 78.0–100.0)
PLATELETS: 114 10*3/uL — AB (ref 150–400)
RBC: 2.89 MIL/uL — AB (ref 3.87–5.11)
RDW: 17.6 % — ABNORMAL HIGH (ref 11.5–15.5)
WBC: 5.4 10*3/uL (ref 4.0–10.5)

## 2016-02-29 LAB — T-HELPER CELLS (CD4) COUNT (NOT AT ARMC)
CD4 % Helper T Cell: 19 % — ABNORMAL LOW (ref 33–55)
CD4 T Cell Abs: 180 /uL — ABNORMAL LOW (ref 400–2700)

## 2016-02-29 LAB — TSH: TSH: 0.844 u[IU]/mL (ref 0.350–4.500)

## 2016-02-29 LAB — HEPATITIS PANEL, ACUTE
HEP A IGM: NEGATIVE
Hep B C IgM: NEGATIVE
Hepatitis B Surface Ag: NEGATIVE

## 2016-02-29 LAB — URINE CULTURE
CULTURE: NO GROWTH
Special Requests: NORMAL

## 2016-02-29 LAB — MAGNESIUM: Magnesium: 1.8 mg/dL (ref 1.7–2.4)

## 2016-02-29 LAB — PREPARE RBC (CROSSMATCH)

## 2016-02-29 LAB — RPR: RPR: NONREACTIVE

## 2016-02-29 LAB — PHOSPHORUS: Phosphorus: 2.3 mg/dL — ABNORMAL LOW (ref 2.5–4.6)

## 2016-02-29 MED ORDER — VITAMIN D 1000 UNITS PO TABS
2000.0000 [IU] | ORAL_TABLET | Freq: Two times a day (BID) | ORAL | Status: DC
Start: 2016-02-29 — End: 2016-03-07
  Administered 2016-02-29 – 2016-03-07 (×15): 2000 [IU] via ORAL
  Filled 2016-02-29 (×15): qty 2

## 2016-02-29 MED ORDER — VITAMIN C 500 MG PO TABS
500.0000 mg | ORAL_TABLET | Freq: Every day | ORAL | Status: DC
  Administered 2016-02-29 – 2016-03-07 (×8): 500 mg via ORAL
  Filled 2016-02-29 (×8): qty 1

## 2016-02-29 MED ORDER — SODIUM CHLORIDE 0.9 % IV SOLN: Freq: Once | INTRAVENOUS | Status: DC

## 2016-02-29 MED ORDER — OXYCODONE HCL 5 MG PO TABS
10.0000 mg | ORAL_TABLET | ORAL | Status: DC | PRN
  Administered 2016-03-01 (×2): 10 mg via ORAL
  Filled 2016-02-29 (×2): qty 2
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
## 2016-02-29 NOTE — Progress Notes (Signed)
PT Cancellation Note  Patient Details Name: Eileen [name] MRN: [id number] DOB: Mar 22, 1986   Cancelled Treatment:    Reason Eval/Treat Not Completed: Patient at procedure or test/unavailable.  Pt at XRT.  PT will check back later today or tomorrow.   Thanks,    Rollene Rotundaebecca B. Argelia Formisano, PT, DPT [phone]   02/29/2016, 12:13 PM

## 2016-02-29 NOTE — Progress Notes (Signed)
[facility] Therapy Treatment Record Phone [phone]   Radiation Therapy was administered to Eileen [name] on: 02/29/2016  1:22 PM and was treatment # 1 out of a planned course of 1 treatments.  Radiation Treatment  1). Beam photons with 6-10 energy  2). Brachytherapy None  3). Stereotactic Radiosurgery none    4). Other Radiation None     Eileen [name] L, Rad Therap

## 2016-02-29 NOTE — Progress Notes (Signed)
Trauma Service Note  Subjective: Patient better with PCA.  No acute distress  Objective: Vital signs in last 24 hours: Temp:  [97.8 F (36.6 C)-101.5 F (38.6 C)] 100.4 F (38 C) (05/10 0910) Pulse Rate:  [90-117] 98 (05/10 0910) Resp:  [15-30] 16 (05/10 0910) BP: (107-140)/(31-68) 119/54 mmHg (05/10 0910) SpO2:  [81 %-100 %] 97 % (05/10 0910)    Intake/Output from previous day: 05/09 0701 - 05/10 0700 In: 3762.3 [P.O.:900; I.V.:2562.3; IV Piggyback:300] Out: 1750 [Urine:1705; Drains:45] Intake/Output this shift: Total I/O In: 563.3 [P.O.:200; I.V.:50; Blood:313.3] Out: 125 [Urine:125]  General: No acute distress at rest.  Hemoglobin 6.4 this AM.  Gave one unit of blood.  Lungs: Clear  Abd: Soft, good bowel sounds.  Extremities: No changes  Neuro:  Intact  Lab Results: CBC   Recent Labs  02/28/16 1315 02/29/16 0520  WBC 6.2 5.1  HGB 7.0* 6.4*  HCT 22.6* 21.1*  PLT 104* 113*   BMET  Recent Labs  02/27/16 0503  02/27/16 2031 02/29/16 0520  NA 136  < > 138 137  K 3.9  < > 4.4 3.8  CL 104  --   --  100*  CO2 24  --   --  29  GLUCOSE 123*  < > 140* 112*  BUN 9  --   --  <5*  CREATININE 0.77  --   --  0.59  CALCIUM 8.3*  --   --  7.8*  < > = values in this interval not displayed. PT/INR  Recent Labs  02/26/16 1148 02/27/16 0503  LABPROT 14.3 14.8  INR 1.09 1.14   ABG  Recent Labs  02/27/16 1735 02/27/16 2324  PHART 7.411 7.359  HCO3 23.7 28.2*    Studies/Results: Dg Pelvis Comp Min 3v  02/28/2016  CLINICAL DATA:  Postop. EXAM: JUDET PELVIS - 3+ VIEW COMPARISON:  Multiple priors. FINDINGS: LEFT Acetabular fracture is redemonstrated, status post malleable plate and screw repair. Mild distraction of the fracture fragments is redemonstrated. Stable position and alignment. IMPRESSION: As above. Electronically Signed   By: Elsie StainJohn T Curnes M.D.   On: 02/28/2016 07:17   Dg Pelvis 3v Judet  02/27/2016  CLINICAL DATA:  OR IF left acetabular fracture  EXAM: DG C-ARM GT 120 MIN; JUDET PELVIS - 3+ VIEW FLUOROSCOPY TIME:  Radiation Exposure Index (as provided by the fluoroscopic device): If the device does not provide the exposure index: Fluoroscopy Time (in minutes and seconds):  20 seconds Number of Acquired Images:  None COMPARISON:  02/26/2016 FINDINGS: Multiple intraoperative spot images demonstrate plate and screw fixation across the left acetabular fracture. No visible complicating features. IMPRESSION: Internal fixation across the left acetabular fracture without visible complicating feature. Electronically Signed   By: Charlett NoseKevin  Dover M.D.   On: 02/27/2016 20:42   Dg C-arm Gt 120 Min  02/27/2016  CLINICAL DATA:  OR IF left acetabular fracture EXAM: DG C-ARM GT 120 MIN; JUDET PELVIS - 3+ VIEW FLUOROSCOPY TIME:  Radiation Exposure Index (as provided by the fluoroscopic device): If the device does not provide the exposure index: Fluoroscopy Time (in minutes and seconds):  20 seconds Number of Acquired Images:  None COMPARISON:  02/26/2016 FINDINGS: Multiple intraoperative spot images demonstrate plate and screw fixation across the left acetabular fracture. No visible complicating features. IMPRESSION: Internal fixation across the left acetabular fracture without visible complicating feature. Electronically Signed   By: Charlett NoseKevin  Dover M.D.   On: 02/27/2016 20:42    Anti-infectives: Anti-infectives    Start  Dose/Rate Route Frequency Ordered Stop   02/28/16 0400  ceFAZolin (ANCEF) IVPB 2g/100 mL premix     2 g 200 mL/hr over 30 Minutes Intravenous Every 8 hours 02/27/16 2210 03/02/16 0359   02/27/16 1345  ceFAZolin (ANCEF) 3 g in dextrose 5 % 50 mL IVPB     3 g 130 mL/hr over 30 Minutes Intravenous To Short Stay 02/27/16 1330 02/27/16 1951   02/26/16 2200  ceFAZolin (ANCEF) 3 g in dextrose 5 % 50 mL IVPB     3 g 130 mL/hr over 30 Minutes Intravenous  Once 02/26/16 2054 02/26/16 2239   02/26/16 1330  ceFAZolin (ANCEF) IVPB 1 g/50 mL premix     1  g 100 mL/hr over 30 Minutes Intravenous  Once 02/26/16 1319 02/26/16 1514      Assessment/Plan: s/p Procedure(s): OPEN REDUCTION INTERNAL FIXATION (ORIF) ACETABULAR FRACTURE IRRIGATION AND DEBRIDEMENT RIGHT KNEE Advance diet  LOS: 3 days   Marta Lamas. Gae Bon, MD, FACS [phone] Trauma Surgeon 02/29/2016

## 2016-02-29 NOTE — Progress Notes (Signed)
   Post Radiation Note:  Report called to Inpatient RN: Curt JewsKaty,RN 3N , [facility] is here to take patient back, they stayed while patient received her radiation Time: 02/29/2016,12:25 PM  Was radiation completed: Yes 1253pm  Were any PRN medications given:  Name and time: NO, Patient on PCA Dilaudid Pump, NS in LUarm Picc line infusing no swelling or redness, op site dry,intact  Were there any additional events: No  Lowella PettiesMcElroy, Samarrah Tranchina Bruner, RN 02/29/2016,12:25 PM

## 2016-02-29 NOTE — Anesthesia Postprocedure Evaluation (Signed)
Anesthesia Post Note  Patient: Eileen [name]  Procedure(s) Performed: Procedure(s) (LRB): OPEN REDUCTION INTERNAL FIXATION (ORIF) ACETABULAR FRACTURE (Left) IRRIGATION AND DEBRIDEMENT RIGHT KNEE (Right)  Patient location during evaluation: ICU Anesthesia Type: General Level of consciousness: sedated and patient remains intubated per anesthesia plan Pain management: pain level controlled Vital Signs Assessment: post-procedure vital signs reviewed and stable Respiratory status: patient remains intubated per anesthesia plan Cardiovascular status: stable Anesthetic complications: no    Last Vitals:  Filed Vitals:   02/29/16 0810 02/29/16 0910  BP: 118/49 119/54  Pulse: 104 98  Temp: 38 C 38 C  Resp: 17 16    Last Pain:  Filed Vitals:   02/29/16 0936  PainSc: 6                  Bonifacio Pruden S

## 2016-02-29 NOTE — Telephone Encounter (Signed)
error 

## 2016-02-29 NOTE — Progress Notes (Signed)
See inpt consult 
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
## 2016-02-29 NOTE — Telephone Encounter (Addendum)
   Pre-Radiation Note: calleed Eileen BurKaty [name] to re arrange transport  Patient here at 1130 am instead of noon  Inpatient nurse name: Eileen LeitzKaty,[name]  Time Called: 7:36 AM  Eileen [name] this am   [facility] called to verify transportation:  Time:737 [name] calling [facility] now to change times to have patient here at 1130am, calleed [facility] at 944am,spoke with Eileen [name], yes, they have the time change and will have patient here  At radiation oncology at 1130am,thanked Eileen [name] 8:45 AM   Patient Status: stable, on PCA Dilaudid  Pain: all over Pain medication given: PCA dilaudid Mobility Orders: bed bound left leg  surgery 02/27/16 Treatment Site: Left hip/ pelvic//leg Additional Injuries:  Neck,  facial, right knee wound   Consent:    Is patient able to sign consent: yes Family member called:  Name/Time: no     Eileen [name], Eileen Rahimi Bruner, [name] 02/29/2016,7:36 AM
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
## 2016-03-01 ENCOUNTER — Inpatient Hospital Stay (HOSPITAL_COMMUNITY): Payer: Medicaid Other

## 2016-03-01 DIAGNOSIS — F1410 Cocaine abuse, uncomplicated: Secondary | ICD-10-CM

## 2016-03-01 DIAGNOSIS — R509 Fever, unspecified: Secondary | ICD-10-CM

## 2016-03-01 LAB — TYPE AND SCREEN
ABO/RH(D): O POS
ANTIBODY SCREEN: NEGATIVE
UNIT DIVISION: 0
Unit division: 0

## 2016-03-01 LAB — PTH, INTACT AND CALCIUM
CALCIUM TOTAL (PTH): 7.4 mg/dL — AB (ref 8.7–10.2)
PTH: 92 pg/mL — AB (ref 15–65)

## 2016-03-01 LAB — PREPARE RBC (CROSSMATCH)

## 2016-03-01 LAB — CALCITRIOL (1,25 DI-OH VIT D): Vit D, 1,25-Dihydroxy: 21.3 pg/mL (ref 19.9–79.3)

## 2016-03-01 MED ORDER — POLYETHYLENE GLYCOL 3350 17 G PO PACK
17.0000 g | PACK | Freq: Every day | ORAL | Status: DC
  Administered 2016-03-01 – 2016-03-07 (×7): 17 g via ORAL
  Filled 2016-03-01 (×7): qty 1

## 2016-03-01 MED ORDER — DEXTROSE 5 % IV SOLN
1.0000 g | Freq: Two times a day (BID) | INTRAVENOUS | Status: DC
  Administered 2016-03-01 – 2016-03-02 (×2): 1 g via INTRAVENOUS
  Filled 2016-03-01 (×4): qty 1

## 2016-03-01 MED ORDER — DOCUSATE SODIUM 100 MG PO CAPS
100.0000 mg | ORAL_CAPSULE | Freq: Two times a day (BID) | ORAL | Status: DC
  Administered 2016-03-01 – 2016-03-07 (×13): 100 mg via ORAL
  Filled 2016-03-01 (×13): qty 1

## 2016-03-01 MED ORDER — TRAMADOL HCL 50 MG PO TABS
50.0000 mg | ORAL_TABLET | Freq: Four times a day (QID) | ORAL | Status: DC
  Administered 2016-03-01 – 2016-03-07 (×25): 50 mg via ORAL
  Filled 2016-03-01 (×24): qty 1

## 2016-03-01 MED ORDER — OXYCODONE HCL 5 MG PO TABS
10.0000 mg | ORAL_TABLET | ORAL | Status: DC | PRN
  Administered 2016-03-01 – 2016-03-02 (×4): 20 mg via ORAL
  Administered 2016-03-03: 10 mg via ORAL
  Administered 2016-03-03 (×2): 20 mg via ORAL
  Administered 2016-03-03 (×2): 10 mg via ORAL
  Administered 2016-03-04 – 2016-03-05 (×8): 20 mg via ORAL
  Administered 2016-03-06: 10 mg via ORAL
  Administered 2016-03-06 – 2016-03-07 (×3): 20 mg via ORAL
  Filled 2016-03-01 (×13): qty 4
  Filled 2016-03-01: qty 2
  Filled 2016-03-01: qty 4
  Filled 2016-03-01: qty 2
  Filled 2016-03-01 (×3): qty 4
  Filled 2016-03-01 (×2): qty 2
  Filled 2016-03-01 (×3): qty 4

## 2016-03-01 MED ORDER — SODIUM CHLORIDE 0.9 % IV SOLN
Freq: Once | INTRAVENOUS | Status: AC
Start: 2016-03-01 — End: 2016-03-01
  Administered 2016-03-01: 15:00:00 via INTRAVENOUS
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
## 2016-03-01 NOTE — Progress Notes (Signed)
Inpatient Rehabilitation  Per PT request, patient was screened by Fae PippinMelissa Edgar Reisz for appropriateness for an Inpatient Acute Rehab consult.  At this time patient is unable to get out of bed or demonstrate ability to tolerate intensive therapies.  Plan to follow along for pain management and improved ability to participate in therapies.  Please call with questions.      Charlane FerrettiMelissa Latoia Eyster, M.A., CCC/SLP Admission Coordinator  [facility]  Cell [phone]
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
## 2016-03-01 NOTE — Progress Notes (Signed)
Patient ID: Donnelly AngelicaFallon Mcelhaney, female   DOB: 1986/01/01, 30 y.o.   MRN: [id number] CXR with LLL infiltrate. Change to Maxipime and culture sputum. Violeta GelinasBurke Lolitha Tortora, MD, MPH, FACS Trauma: [phone] General Surgery: [phone]

## 2016-03-02 DIAGNOSIS — J189 Pneumonia, unspecified organism: Secondary | ICD-10-CM

## 2016-03-02 LAB — TYPE AND SCREEN
ABO/RH(D): O POS
Antibody Screen: NEGATIVE
Unit division: 0

## 2016-03-02 LAB — CBC
HEMATOCRIT: 25 % — AB (ref 36.0–46.0)
HEMOGLOBIN: 7.6 g/dL — AB (ref 12.0–15.0)
MCH: 24 pg — ABNORMAL LOW (ref 26.0–34.0)
MCHC: 30.4 g/dL (ref 30.0–36.0)
MCV: 78.9 fL (ref 78.0–100.0)
Platelets: 132 10*3/uL — ABNORMAL LOW (ref 150–400)
RBC: 3.17 MIL/uL — ABNORMAL LOW (ref 3.87–5.11)
RDW: 17 % — ABNORMAL HIGH (ref 11.5–15.5)
WBC: 4 10*3/uL (ref 4.0–10.5)

## 2016-03-02 MED ORDER — ELVITEG-COBIC-EMTRICIT-TENOFAF 150-150-200-10 MG PO TABS
1.0000 | ORAL_TABLET | Freq: Every day | ORAL | Status: DC
  Administered 2016-03-02 – 2016-03-07 (×6): 1 via ORAL
  Filled 2016-03-02 (×6): qty 1

## 2016-03-02 MED ORDER — RILPIVIRINE HCL 25 MG PO TABS
25.0000 mg | ORAL_TABLET | Freq: Every day | ORAL | Status: DC
Start: 2016-03-03 — End: 2016-03-02

## 2016-03-02 MED ORDER — SULFAMETHOXAZOLE-TRIMETHOPRIM 800-160 MG PO TABS
1.0000 | ORAL_TABLET | ORAL | Status: DC
  Administered 2016-03-02 – 2016-03-07 (×3): 1 via ORAL
  Filled 2016-03-02 (×2): qty 1

## 2016-03-02 MED ORDER — WARFARIN - PHARMACIST DOSING INPATIENT: Freq: Every day | Status: DC

## 2016-03-02 MED ORDER — ENOXAPARIN SODIUM 40 MG/0.4ML ~~LOC~~ SOLN
40.0000 mg | Freq: Two times a day (BID) | SUBCUTANEOUS | Status: DC
  Administered 2016-03-02 – 2016-03-07 (×10): 40 mg via SUBCUTANEOUS
  Filled 2016-03-02 (×10): qty 0.4

## 2016-03-02 MED ORDER — WARFARIN SODIUM 7.5 MG PO TABS
7.5000 mg | ORAL_TABLET | Freq: Once | ORAL | Status: AC
Start: 2016-03-02 — End: 2016-03-02
  Administered 2016-03-02: 7.5 mg via ORAL
  Filled 2016-03-02: qty 1

## 2016-03-02 MED ORDER — CALCIUM CITRATE 950 (200 CA) MG PO TABS
200.0000 mg | ORAL_TABLET | Freq: Two times a day (BID) | ORAL | Status: DC
  Administered 2016-03-02 – 2016-03-07 (×10): 200 mg via ORAL
  Filled 2016-03-02 (×12): qty 1

## 2016-03-02 MED ORDER — EMTRICITABINE-TENOFOVIR AF 200-25 MG PO TABS
1.0000 | ORAL_TABLET | Freq: Every day | ORAL | Status: DC
  Filled 2016-03-02: qty 1

## 2016-03-02 MED ORDER — ENSURE ENLIVE PO LIQD
237.0000 mL | Freq: Two times a day (BID) | ORAL | Status: DC
  Administered 2016-03-02 – 2016-03-07 (×9): 237 mL via ORAL

## 2016-03-02 MED ORDER — DEXTROSE 5 % IV SOLN
2.0000 g | Freq: Two times a day (BID) | INTRAVENOUS | Status: DC
  Administered 2016-03-02 – 2016-03-07 (×12): 2 g via INTRAVENOUS
  Filled 2016-03-02 (×12): qty 2

## 2016-03-02 MED ORDER — HYDROMORPHONE HCL 1 MG/ML IJ SOLN
1.0000 mg | INTRAMUSCULAR | Status: DC | PRN
  Administered 2016-03-02 – 2016-03-05 (×7): 2 mg via INTRAVENOUS
  Administered 2016-03-05: 1 mg via INTRAVENOUS
  Administered 2016-03-05: 2 mg via INTRAVENOUS
  Administered 2016-03-06 (×2): 1 mg via INTRAVENOUS
  Administered 2016-03-06 – 2016-03-07 (×3): 2 mg via INTRAVENOUS
  Filled 2016-03-02 (×7): qty 2
  Filled 2016-03-02 (×2): qty 1
  Filled 2016-03-02 (×2): qty 2
  Filled 2016-03-02: qty 1
  Filled 2016-03-02 (×2): qty 2

## 2016-03-02 NOTE — Plan of Care (Signed)
Problem: Food- and Nutrition-Related Knowledge Deficit (NB-1.1) Goal: Nutrition education Formal process to instruct or train a patient/client in a skill or to impart knowledge to help patients/clients voluntarily manage or modify food choices and eating behavior to maintain or improve health. Outcome: Completed/Met Date Met:  03/02/16  RD consulted for nutrition education.  Body mass index is 56.1 kg/(m^2). Pt meets criteria for morbid obesity based on current BMI.  RD provided "General, Healthy Eating Nutrition Therapy" and "Weight Loss Tips" handout from the Academy of [employer]etics. Emphasized the importance of serving sizes and provided examples of correct portions of common foods. Discussed importance of controlled and consistent intake throughout the day. Provided examples of ways to balance meals/snacks and encouraged intake of high-fiber, whole grain complex carbohydrates. Emphasized the importance of hydration with calorie-free beverages and limiting sugar-sweetened beverages. Teach back method used.  Expect fair compliance.  Corrin Parker, MS, RD, LDN Pager # [phone] After hours/ weekend pager # [phone]
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
## 2016-03-02 NOTE — Progress Notes (Addendum)
Report called to Bjorn Loserhonda, RN 5N. PCA DC. VSS. All beloingings sent with patient. Pt transported by bed with 2NT to 5N13 M/S. eICU and CCMD notified of transfer.

## 2016-03-02 NOTE — Discharge Instructions (Addendum)
[facility]th[facility] Trauma Service Discharge Instructions   General Discharge Instructions  WEIGHT BEARING STATUS: Touchdown weightbearing Left leg  RANGE OF MOTION/ACTIVITY: posterior hip precautions Left hip   Wound Care: daily wound care. See below   Discharge Wound Care Instructions  Do NOT apply any ointments, solutions or lotions to pin sites or surgical wounds.  These prevent needed drainage and even though solutions like hydrogen peroxide kill bacteria, they also damage cells lining the pin sites that help fight infection.  Applying lotions or ointments can keep the wounds moist and can cause them to breakdown and open up as well. This can increase the risk for infection. When in doubt call the office.  Surgical incisions should be dressed daily.  If any drainage is noted, use one layer of adaptic, then gauze, Kerlix, and an ace wrap.  Once the incision is completely dry and without drainage, it may be left open to air out.  Showering may begin 36-48 hours later.  Cleaning gently with soap and water.  Traumatic wounds should be dressed daily as well.    One layer of adaptic, gauze, Kerlix, then ace wrap.  The adaptic can be discontinued once the draining has ceased    If you have a wet to dry dressing: wet the gauze with saline the squeeze as much saline out so the gauze is moist (not soaking wet), place moistened gauze over wound, then place a dry gauze over the moist one, followed by Kerlix wrap, then ace wrap.  PAIN MEDICATION USE AND EXPECTATIONS  You have likely been given narcotic medications to help control your pain.  After a traumatic event that results in an fracture (broken bone) with or without surgery, it is ok to use narcotic pain medications to help control one's pain.  We understand that everyone responds to pain differently and each individual patient will be evaluated on a regular basis for the continued need for narcotic medications. Ideally, narcotic medication use  should last no more than 6-8 weeks (coinciding with fracture healing).   As a patient it is your responsibility as well to monitor narcotic medication use and report the amount and frequency you use these medications when you come to your office visit.   We would also advise that if you are using narcotic medications, you should take a dose prior to therapy to maximize you participation.  IF YOU ARE ON NARCOTIC MEDICATIONS IT IS NOT PERMISSIBLE TO OPERATE A MOTOR VEHICLE (MOTORCYCLE/CAR/TRUCK/MOPED) OR HEAVY MACHINERY DO NOT MIX NARCOTICS WITH OTHER CNS (CENTRAL NERVOUS SYSTEM) DEPRESSANTS SUCH AS ALCOHOL  Diet: as you were eating previously.  Can use over the counter stool softeners and bowel preparations, such as Miralax, to help with bowel movements.  Narcotics can be constipating.  Be sure to drink plenty of fluids    STOP SMOKING OR USING NICOTINE PRODUCTS!!!!  As discussed nicotine severely impairs your body's ability to heal surgical and traumatic wounds but also impairs bone healing.  Wounds and bone heal by forming microscopic blood vessels (angiogenesis) and nicotine is a vasoconstrictor (essentially, shrinks blood vessels).  Therefore, if vasoconstriction occurs to these microscopic blood vessels they essentially disappear and are unable to deliver necessary nutrients to the healing tissue.  This is one modifiable factor that you can do to dramatically increase your chances of healing your injury.    (This means no smoking, no nicotine gum, patches, etc)  DO NOT USE NONSTEROIDAL ANTI-INFLAMMATORY DRUGS (NSAID'S)  Using products such as Advil (ibuprofen), Aleve (naproxen),  Motrin (ibuprofen) for additional pain control during fracture healing can delay and/or prevent the healing response.  If you would like to take over the counter (OTC) medication, Tylenol (acetaminophen) is ok.  However, some narcotic medications that are given for pain control contain acetaminophen as well. Therefore,  you should not exceed more than 4000 mg of tylenol in a day if you do not have liver disease.  Also note that there are may OTC medicines, such as cold medicines and allergy medicines that my contain tylenol as well.  If you have any questions about medications and/or interactions please ask your doctor/PA or your pharmacist.      ICE AND ELEVATE INJURED/OPERATIVE EXTREMITY  Using ice and elevating the injured extremity above your heart can help with swelling and pain control.  Icing in a pulsatile fashion, such as 20 minutes on and 20 minutes off, can be followed.    Do not place ice directly on skin. Make sure there is a barrier between to skin and the ice pack.    Using frozen items such as frozen peas works well as the conform nicely to the are that needs to be iced.  USE AN ACE WRAP OR TED HOSE FOR SWELLING CONTROL  In addition to icing and elevation, Ace wraps or TED hose are used to help limit and resolve swelling.  It is recommended to use Ace wraps or TED hose until you are informed to stop.    When using Ace Wraps start the wrapping distally (farthest away from the body) and wrap proximally (closer to the body)   Example: If you had surgery on your leg or thing and you do not have a splint on, start the ace wrap at the toes and work your way up to the thigh        If you had surgery on your upper extremity and do not have a splint on, start the ace wrap at your fingers and work your way up to the upper arm  IF YOU ARE IN A SPLINT OR CAST DO NOT REMOVE IT FOR ANY REASON   If your splint gets wet for any reason please contact the office immediately. You may shower in your splint or cast as long as you keep it dry.  This can be done by wrapping in a cast cover or garbage back (or similar)  Do Not stick any thing down your splint or cast such as pencils, money, or hangers to try and scratch yourself with.  If you feel itchy take benadryl as prescribed on the bottle for itching  IF YOU ARE IN  A CAM BOOT (BLACK BOOT)  You may remove boot periodically. Perform daily dressing changes as noted below.  Wash the liner of the boot regularly and wear a sock when wearing the boot. It is recommended that you sleep in the boot until told otherwise  CALL THE OFFICE WITH ANY QUESTIONS OR CONCERTS: [phone]         Information on my medicine - Coumadin   (Warfarin)  This medication education was reviewed with me or my healthcare representative as part of my discharge preparation.  The pharmacist that spoke with me during my hospital stay was:  Johney Maine, [facility]  Why was Coumadin prescribed for you? Coumadin was prescribed for you because you have a blood clot or a medical condition that can cause an increased risk of forming blood clots. Blood clots can cause serious health problems by blocking the  flow of blood to the heart, lung, or brain. Coumadin can prevent harmful blood clots from forming. As a reminder your indication for Coumadin is:   Blood Clot Prevention After Orthopedic Surgery   What test will check on my response to Coumadin? While on Coumadin (warfarin) you will need to have an INR test regularly to ensure that your dose is keeping you in the desired range. The INR (international normalized ratio) number is calculated from the result of the laboratory test called prothrombin time (PT).  If an INR APPOINTMENT HAS NOT ALREADY BEEN MADE FOR YOU please schedule an appointment to have this lab work done by your health care provider within 7 days. Your INR goal is usually a number between:  2 to 3 or your provider may give you a more narrow range like 2-2.5.  Ask your health care provider during an office visit what your goal INR is.  What  do you need to  know  About  COUMADIN? Take Coumadin (warfarin) exactly as prescribed by your healthcare provider about the same time each day.  DO NOT stop taking without talking to the doctor who prescribed the medication.  Stopping  without other blood clot prevention medication to take the place of Coumadin may increase your risk of developing a new clot or stroke.  Get refills before you run out.  What do you do if you miss a dose? If you miss a dose, take it as soon as you remember on the same day then continue your regularly scheduled regimen the next day.  Do not take two doses of Coumadin at the same time.  Important Safety Information A possible side effect of Coumadin (Warfarin) is an increased risk of bleeding. You should call your healthcare provider right away if you experience any of the following: ? Bleeding from an injury or your nose that does not stop. ? Unusual colored urine (red or dark brown) or unusual colored stools (red or black). ? Unusual bruising for unknown reasons. ? A serious fall or if you hit your head (even if there is no bleeding).  Some foods or medicines interact with Coumadin (warfarin) and might alter your response to warfarin. To help avoid this: ? Eat a balanced diet, maintaining a consistent amount of Vitamin K. ? Notify your provider about major diet changes you plan to make. ? Avoid alcohol or limit your intake to 1 drink for women and 2 drinks for men per day. (1 drink is 5 oz. wine, 12 oz. beer, or 1.5 oz. liquor.)  Make sure that ANY health care provider who prescribes medication for you knows that you are taking Coumadin (warfarin).  Also make sure the healthcare provider who is monitoring your Coumadin knows when you have started a new medication including herbals and non-prescription products.  Coumadin (Warfarin)  Major Drug Interactions  Increased Warfarin Effect Decreased Warfarin Effect  Alcohol (large quantities) Antibiotics (esp. Septra/Bactrim, Flagyl, Cipro) Amiodarone (Cordarone) Aspirin (ASA) Cimetidine (Tagamet) Megestrol (Megace) NSAIDs (ibuprofen, naproxen, etc.) Piroxicam (Feldene) Propafenone (Rythmol SR) Propranolol (Inderal) Isoniazid  (INH) Posaconazole (Noxafil) Barbiturates (Phenobarbital) Carbamazepine (Tegretol) Chlordiazepoxide (Librium) Cholestyramine (Questran) Griseofulvin Oral Contraceptives Rifampin Sucralfate (Carafate) Vitamin K   Coumadin (Warfarin) Major Herbal Interactions  Increased Warfarin Effect Decreased Warfarin Effect  Garlic Ginseng Ginkgo biloba Coenzyme Q10 Green tea St. Johns wort    Coumadin (Warfarin) FOOD Interactions  Eat a consistent number of servings per week of foods HIGH in Vitamin K (1 serving =  cup)  Collards (  cooked, or boiled & drained) Kale (cooked, or boiled & drained) Mustard greens (cooked, or boiled & drained) Parsley *serving size only =  cup Spinach (cooked, or boiled & drained) Swiss chard (cooked, or boiled & drained) Turnip greens (cooked, or boiled & drained)  Eat a consistent number of servings per week of foods MEDIUM-HIGH in Vitamin K (1 serving = 1 cup)  Asparagus (cooked, or boiled & drained) Broccoli (cooked, boiled & drained, or raw & chopped) Brussel sprouts (cooked, or boiled & drained) *serving size only =  cup Lettuce, raw (green leaf, endive, romaine) Spinach, raw Turnip greens, raw & chopped   These websites have more information on Coumadin (warfarin):  http://www.king-r[place]ell.com/www.coumadin.com; https://www.hines.net/www.ahrq.gov/consumer/coumadin.htm;
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
## 2016-03-02 NOTE — Progress Notes (Signed)
Trauma Service Note  Subjective: Patient looks great this morning.  No distress.  Has been up to the side of the bed.  Objective: Vital signs in last 24 hours: Temp:  [98.8 F (37.1 C)-99.9 F (37.7 C)] 98.8 F (37.1 C) (05/12 0807) Pulse Rate:  [100-112] 112 (05/12 0359) Resp:  [13-28] 13 (05/12 0800) BP: (107-161)/(51-81) 137/73 mmHg (05/12 0359) SpO2:  [84 %-100 %] 92 % (05/12 0800)    Intake/Output from previous day: 05/11 0701 - 05/12 0700 In: 1475 [P.O.:480; I.V.:560; Blood:335; IV Piggyback:100] Out: 1502.5 [Urine:1450; Drains:52.5] Intake/Output this shift:    General: No acute distress.  Lungs: Clear  Abd: Mild right sided periumbilical discomfort/cramping.  Very mild.  Excellent bowel sounds.  Has had bowel movement  Extremities: No changes.  TDWB LLE,  Has not tried to stand yet.  Neuro: Intact  Lab Results: CBC   Recent Labs  02/29/16 1100 03/02/16 0500  WBC 5.4 4.0  HGB 7.0* 7.6*  HCT 23.0* 25.0*  PLT 114* 132*   BMET  Recent Labs  02/29/16 0520  NA 137  K 3.8  CL 100*  CO2 29  GLUCOSE 112*  BUN <5*  CREATININE 0.59  CALCIUM 7.8*  7.4*   PT/INR No results for input(s): LABPROT, INR in the last 72 hours. ABG No results for input(s): PHART, HCO3 in the last 72 hours.  Invalid input(s): PCO2, PO2  Studies/Results: Dg Chest Port 1 View  03/01/2016  CLINICAL DATA:  Fever, 3 days postoperative EXAM: PORTABLE CHEST 1 VIEW COMPARISON:  Chest radiograph and chest CT Feb 26, 2016 FINDINGS: There is patchy infiltrate in the left base, likely pneumonia. Lungs elsewhere clear. Heart is mildly enlarged with pulmonary vascularity within normal limits. No adenopathy. No bone lesions. IMPRESSION: Patchy infiltrate left base, likely pneumonia. Lungs elsewhere clear. Cardiac prominence present. These results will be called to the ordering clinician or representative by the Radiologist Assistant, and communication documented in the PACS or zVision  Dashboard. Electronically Signed   By: Bretta BangWilliam  Woodruff III M.D.   On: 03/01/2016 15:01   Dg Abd Portable 1v  03/01/2016  CLINICAL DATA:  Fever 3 days postoperative EXAM: PORTABLE ABDOMEN - 1 VIEW COMPARISON:  None. FINDINGS: There is no bowel dilatation or air-fluid level suggesting obstruction. No free air. Rectal thermometer in place. Postoperative changes noted in the left hip/acetabular region. IMPRESSION: No bowel obstruction or free air evident. Electronically Signed   By: Bretta BangWilliam  Woodruff III M.D.   On: 03/01/2016 15:02    Anti-infectives: Anti-infectives    Start     Dose/Rate Route Frequency Ordered Stop   03/01/16 1600  ceFEPIme (MAXIPIME) 1 g in dextrose 5 % 50 mL IVPB     1 g 100 mL/hr over 30 Minutes Intravenous Every 12 hours 03/01/16 1525     02/28/16 0400  ceFAZolin (ANCEF) IVPB 2g/100 mL premix  Status:  Discontinued     2 g 200 mL/hr over 30 Minutes Intravenous Every 8 hours 02/27/16 2210 03/01/16 1525   02/27/16 1345  ceFAZolin (ANCEF) 3 g in dextrose 5 % 50 mL IVPB     3 g 130 mL/hr over 30 Minutes Intravenous To Short Stay 02/27/16 1330 02/27/16 1951   02/26/16 2200  ceFAZolin (ANCEF) 3 g in dextrose 5 % 50 mL IVPB     3 g 130 mL/hr over 30 Minutes Intravenous  Once 02/26/16 2054 02/26/16 2239   02/26/16 1330  ceFAZolin (ANCEF) IVPB 1 g/50 mL premix  1 g 100 mL/hr over 30 Minutes Intravenous  Once 02/26/16 1319 02/26/16 1514      Assessment/Plan: s/p Procedure(s): OPEN REDUCTION INTERNAL FIXATION (ORIF) ACETABULAR FRACTURE IRRIGATION AND DEBRIDEMENT RIGHT KNEE Transfer to 5N.  DC PCA  Rehab consultation.  LOS: 5 days   Marta Lamas. Gae Bon, MD, FACS [phone] Trauma Surgeon 03/02/2016
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
## 2016-03-03 ENCOUNTER — Inpatient Hospital Stay (HOSPITAL_COMMUNITY): Payer: Medicaid Other

## 2016-03-03 DIAGNOSIS — S329XXA Fracture of unspecified parts of lumbosacral spine and pelvis, initial encounter for closed fracture: Secondary | ICD-10-CM

## 2016-03-03 LAB — COCAINE,MS,WB/SP RFX
Benzoylecgonine: 249 ng/mL
COCAINE CONFIRMATION: POSITIVE
Cocaine: NEGATIVE ng/mL

## 2016-03-03 LAB — CBC
HCT: 27.5 % — ABNORMAL LOW (ref 36.0–46.0)
Hemoglobin: 8.4 g/dL — ABNORMAL LOW (ref 12.0–15.0)
MCH: 24.3 pg — AB (ref 26.0–34.0)
MCHC: 30.5 g/dL (ref 30.0–36.0)
MCV: 79.7 fL (ref 78.0–100.0)
PLATELETS: 179 10*3/uL (ref 150–400)
RBC: 3.45 MIL/uL — AB (ref 3.87–5.11)
RDW: 17.2 % — AB (ref 11.5–15.5)
WBC: 5.1 10*3/uL (ref 4.0–10.5)

## 2016-03-03 LAB — PROTIME-INR
INR: 1.25 (ref 0.00–1.49)
PROTHROMBIN TIME: 15.8 s — AB (ref 11.6–15.2)

## 2016-03-03 MED ORDER — CIPROFLOXACIN HCL 0.3 % OP SOLN
2.0000 [drp] | OPHTHALMIC | Status: DC
  Administered 2016-03-03 – 2016-03-07 (×22): 2 [drp] via OPHTHALMIC
  Filled 2016-03-03 (×2): qty 2.5

## 2016-03-03 MED ORDER — WARFARIN SODIUM 7.5 MG PO TABS
7.5000 mg | ORAL_TABLET | Freq: Once | ORAL | Status: AC
Start: 2016-03-03 — End: 2016-03-03
  Administered 2016-03-03: 7.5 mg via ORAL
  Filled 2016-03-03: qty 1

## 2016-03-03 NOTE — Progress Notes (Signed)
Pt has LUA DL PICC placed on 1/6/105/8/17. During routine central line checks PICC was found with dsg off and PICC pulled out to 12cm mark. Site care and dsg chg done per policy. Reported findings to Reuel Boomaniel, Charity fundraiserN. Requested RN to notify MD for further orders regarding PICC line. IVFs disconnected from PICC line and both ports capped/taped off at present time. PIV started per RN request at this time.     Gilman SchmidtMelissa Ilario Dhaliwal, RN IV Team

## 2016-03-03 NOTE — Progress Notes (Signed)
VASCULAR LAB PRELIMINARY  PRELIMINARY  PRELIMINARY  PRELIMINARY  Bilateral lower extremity venous duplex  completed.    Preliminary report:  Bilateral:  No evidence of DVT, superficial thrombosis, or Baker's Cyst.    Brennley Curtice, RVT 03/03/2016, 2:43 PM
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
## 2016-03-04 LAB — OPIATES,MS,WB/SP RFX
6-Acetylmorphine: NEGATIVE
Codeine: NEGATIVE ng/mL
DIHYDROCODEINE: NEGATIVE ng/mL
HYDROCODONE: NEGATIVE ng/mL
HYDROMORPHONE: 1.6 ng/mL
Morphine: NEGATIVE ng/mL
OPIATE CONFIRMATION: POSITIVE

## 2016-03-04 LAB — DRUG SCREEN 10 W/CONF, SERUM
AMPHETAMINES, IA: NEGATIVE ng/mL
BENZODIAZEPINES, IA: NEGATIVE ng/mL
Barbiturates, IA: NEGATIVE ug/mL
Cocaine & Metabolite, IA: POSITIVE ng/mL
Methadone, IA: NEGATIVE ng/mL
OPIATES, IA: POSITIVE ng/mL
OXYCODONES, IA: POSITIVE ng/mL
Phencyclidine, IA: NEGATIVE ng/mL
Propoxyphene, IA: NEGATIVE ng/mL
THC(Marijuana) Metabolite, IA: NEGATIVE ng/mL

## 2016-03-04 LAB — CULTURE, BLOOD (SINGLE): Culture: NO GROWTH

## 2016-03-04 LAB — OXYCODONES,MS,WB/SP RFX
OXYCOCONE: 6.2 ng/mL
OXYCODONES CONFIRMATION: POSITIVE
Oxymorphone: NEGATIVE ng/mL

## 2016-03-04 LAB — CULTURE, BLOOD (ROUTINE X 2): Culture: NO GROWTH

## 2016-03-04 LAB — PROTIME-INR
INR: 1.37 (ref 0.00–1.49)
Prothrombin Time: 17 seconds — ABNORMAL HIGH (ref 11.6–15.2)

## 2016-03-04 MED ORDER — WARFARIN SODIUM 5 MG PO TABS
10.0000 mg | ORAL_TABLET | Freq: Once | ORAL | Status: AC
  Administered 2016-03-04: 10 mg via ORAL
  Filled 2016-03-04: qty 2
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
## 2016-03-04 NOTE — Progress Notes (Signed)
Trauma Service Note  Subjective: Patient in no distress.  Able to get up on her own and go to commode  Objective: Vital signs in last 24 hours: Temp:  [99.3 F (37.4 C)] 99.3 F (37.4 C) (05/14 0519) Pulse Rate:  [88-122] 122 (05/14 0519) Resp:  [18] 18 (05/14 0519) BP: (118-123)/(41-70) 118/41 mmHg (05/14 0519) SpO2:  [95 %-97 %] 95 % (05/14 0519)    Intake/Output from previous day: 05/13 0701 - 05/14 0700 In: 250 [P.O.:240; I.V.:10] Out: -  Intake/Output this shift:    General: No acute distress  Lungs: Clear.  Sats are good  Abd: Benign.  Extremities: Right knee wound exposed.  Not infected  Neuro: Intact  Lab Results: CBC   Recent Labs  03/02/16 0500 03/03/16 0430  WBC 4.0 5.1  HGB 7.6* 8.4*  HCT 25.0* 27.5*  PLT 132* 179   BMET No results for input(s): NA, K, CL, CO2, GLUCOSE, BUN, CREATININE, CALCIUM in the last 72 hours. PT/INR  Recent Labs  03/03/16 0430 03/04/16 0555  LABPROT 15.8* 17.0*  INR 1.25 1.37   ABG No results for input(s): PHART, HCO3 in the last 72 hours.  Invalid input(s): PCO2, PO2  Studies/Results: No results found.  Anti-infectives: Anti-infectives    Start     Dose/Rate Route Frequency Ordered Stop   03/03/16 0700  rilpivirine (EDURANT) tablet 25 mg  Status:  Discontinued     25 mg Oral Daily with breakfast 03/02/16 1034 03/02/16 1108   03/02/16 1600  ceFEPIme (MAXIPIME) 2 g in dextrose 5 % 50 mL IVPB     2 g 100 mL/hr over 30 Minutes Intravenous Every 12 hours 03/02/16 1513     03/02/16 1200  emtricitabine-tenofovir AF (DESCOVY) 200-25 MG per tablet 1 tablet  Status:  Discontinued     1 tablet Oral Daily 03/02/16 1034 03/02/16 1108   03/02/16 1200  sulfamethoxazole-trimethoprim (BACTRIM DS,SEPTRA DS) 800-160 MG per tablet 1 tablet     1 tablet Oral Once per day on Mon Wed Fri 03/02/16 1034     03/02/16 1200  elvitegravir-cobicistat-emtricitabine-tenofovir (GENVOYA) 150-150-200-10 MG tablet 1 tablet     1 tablet  Oral Daily with breakfast 03/02/16 1109     03/01/16 1600  ceFEPIme (MAXIPIME) 1 g in dextrose 5 % 50 mL IVPB  Status:  Discontinued     1 g 100 mL/hr over 30 Minutes Intravenous Every 12 hours 03/01/16 1525 03/02/16 1513   02/28/16 0400  ceFAZolin (ANCEF) IVPB 2g/100 mL premix  Status:  Discontinued     2 g 200 mL/hr over 30 Minutes Intravenous Every 8 hours 02/27/16 2210 03/01/16 1525   02/27/16 1345  ceFAZolin (ANCEF) 3 g in dextrose 5 % 50 mL IVPB     3 g 130 mL/hr over 30 Minutes Intravenous To Short Stay 02/27/16 1330 02/27/16 1951   02/26/16 2200  ceFAZolin (ANCEF) 3 g in dextrose 5 % 50 mL IVPB     3 g 130 mL/hr over 30 Minutes Intravenous  Once 02/26/16 2054 02/26/16 2239   02/26/16 1330  ceFAZolin (ANCEF) IVPB 1 g/50 mL premix     1 g 100 mL/hr over 30 Minutes Intravenous  Once 02/26/16 1319 02/26/16 1514      Assessment/Plan: s/p Procedure(s): OPEN REDUCTION INTERNAL FIXATION (ORIF) ACETABULAR FRACTURE IRRIGATION AND DEBRIDEMENT RIGHT KNEE Rehab when accepted and bed available.  LOS: 7 days   Marta LamasJames O. Gae BonWyatt, III, MD, FACS [phone] Trauma Surgeon 03/04/2016

## 2016-03-05 ENCOUNTER — Ambulatory Visit: Payer: Self-pay | Admitting: *Deleted

## 2016-03-05 ENCOUNTER — Telehealth: Payer: Self-pay | Admitting: *Deleted

## 2016-03-05 DIAGNOSIS — G8918 Other acute postprocedural pain: Secondary | ICD-10-CM | POA: Insufficient documentation

## 2016-03-05 DIAGNOSIS — T148 Other injury of unspecified body region: Secondary | ICD-10-CM

## 2016-03-05 DIAGNOSIS — S12591D Other nondisplaced fracture of sixth cervical vertebra, subsequent encounter for fracture with routine healing: Secondary | ICD-10-CM

## 2016-03-05 DIAGNOSIS — IMO0002 Reserved for concepts with insufficient information to code with codable children: Secondary | ICD-10-CM | POA: Insufficient documentation

## 2016-03-05 DIAGNOSIS — Z9119 Patient's noncompliance with other medical treatment and regimen: Secondary | ICD-10-CM

## 2016-03-05 DIAGNOSIS — S32423A Displaced fracture of posterior wall of unspecified acetabulum, initial encounter for closed fracture: Secondary | ICD-10-CM | POA: Insufficient documentation

## 2016-03-05 DIAGNOSIS — Z91199 Patient's noncompliance with other medical treatment and regimen due to unspecified reason: Secondary | ICD-10-CM | POA: Insufficient documentation

## 2016-03-05 DIAGNOSIS — S129XXA Fracture of neck, unspecified, initial encounter: Secondary | ICD-10-CM | POA: Insufficient documentation

## 2016-03-05 DIAGNOSIS — S32422D Displaced fracture of posterior wall of left acetabulum, subsequent encounter for fracture with routine healing: Secondary | ICD-10-CM

## 2016-03-05 DIAGNOSIS — D62 Acute posthemorrhagic anemia: Secondary | ICD-10-CM

## 2016-03-05 DIAGNOSIS — B20 Human immunodeficiency virus [HIV] disease: Secondary | ICD-10-CM

## 2016-03-05 DIAGNOSIS — F1910 Other psychoactive substance abuse, uncomplicated: Secondary | ICD-10-CM

## 2016-03-05 DIAGNOSIS — R000 Tachycardia, unspecified: Secondary | ICD-10-CM

## 2016-03-05 DIAGNOSIS — F1010 Alcohol abuse, uncomplicated: Secondary | ICD-10-CM

## 2016-03-05 LAB — REFLEX TO GENOSURE(R) MG: HIV GENOSURE(R) MG PDF: 0

## 2016-03-05 LAB — HIV-1 RNA ULTRAQUANT REFLEX TO GENTYP+
HIV-1 RNA BY PCR: 52800 copies/mL
HIV-1 RNA QUANT, LOG: 4.723 {Log_copies}/mL

## 2016-03-05 LAB — PROTIME-INR
INR: 1.63 — AB (ref 0.00–1.49)
Prothrombin Time: 19.3 seconds — ABNORMAL HIGH (ref 11.6–15.2)

## 2016-03-05 LAB — HLA B*5701: HLA B 5701: NEGATIVE

## 2016-03-05 MED ORDER — WARFARIN SODIUM 5 MG PO TABS
10.0000 mg | ORAL_TABLET | Freq: Once | ORAL | Status: AC
  Administered 2016-03-05: 10 mg via ORAL
  Filled 2016-03-05: qty 2
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
## 2016-03-05 NOTE — Progress Notes (Signed)
Rehab admissions - Please see rehab consult recommending likely need for CIR. I will follow up in am for possible admit to acute inpatient rehab pending bed availability and medical readiness.  Call me for questions.  #[phone]
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
## 2016-03-05 NOTE — Progress Notes (Signed)
7 Days Post-Op  Subjective: Pt doing well today. No acute events  Objective: Vital signs in last 24 hours: Temp:  [98.3 F (36.8 C)-99 F (37.2 C)] 98.3 F (36.8 C) (05/15 0546) Pulse Rate:  [106-108] 107 (05/15 0546) Resp:  [16-17] 16 (05/15 0546) BP: (92-148)/(56-68) 92/68 mmHg (05/15 0546) SpO2:  [88 %-92 %] 92 % (05/15 0546) Last BM Date: 03/04/16  Intake/Output from previous day:   Intake/Output this shift:    General appearance: alert and cooperative GI: soft, non-tender; bowel sounds normal; no masses,  no organomegaly  Lab Results:   Recent Labs  03/03/16 0430  WBC 5.1  HGB 8.4*  HCT 27.5*  PLT 179   BMET No results for input(s): NA, K, CL, CO2, GLUCOSE, BUN, CREATININE, CALCIUM in the last 72 hours. PT/INR  Recent Labs  03/04/16 0555 03/05/16 0712  LABPROT 17.0* 19.3*  INR 1.37 1.63*   ABG No results for input(s): PHART, HCO3 in the last 72 hours.  Invalid input(s): PCO2, PO2  Studies/Results: No results found.  Anti-infectives: Anti-infectives    Start     Dose/Rate Route Frequency Ordered Stop   03/03/16 0700  rilpivirine (EDURANT) tablet 25 mg  Status:  Discontinued     25 mg Oral Daily with breakfast 03/02/16 1034 03/02/16 1108   03/02/16 1600  ceFEPIme (MAXIPIME) 2 g in dextrose 5 % 50 mL IVPB     2 g 100 mL/hr over 30 Minutes Intravenous Every 12 hours 03/02/16 1513     03/02/16 1200  emtricitabine-tenofovir AF (DESCOVY) 200-25 MG per tablet 1 tablet  Status:  Discontinued     1 tablet Oral Daily 03/02/16 1034 03/02/16 1108   03/02/16 1200  sulfamethoxazole-trimethoprim (BACTRIM DS,SEPTRA DS) 800-160 MG per tablet 1 tablet     1 tablet Oral Once per day on Mon Wed Fri 03/02/16 1034     03/02/16 1200  elvitegravir-cobicistat-emtricitabine-tenofovir (GENVOYA) 150-150-200-10 MG tablet 1 tablet     1 tablet Oral Daily with breakfast 03/02/16 1109     03/01/16 1600  ceFEPIme (MAXIPIME) 1 g in dextrose 5 % 50 mL IVPB  Status:  Discontinued      1 g 100 mL/hr over 30 Minutes Intravenous Every 12 hours 03/01/16 1525 03/02/16 1513   02/28/16 0400  ceFAZolin (ANCEF) IVPB 2g/100 mL premix  Status:  Discontinued     2 g 200 mL/hr over 30 Minutes Intravenous Every 8 hours 02/27/16 2210 03/01/16 1525   02/27/16 1345  ceFAZolin (ANCEF) 3 g in dextrose 5 % 50 mL IVPB     3 g 130 mL/hr over 30 Minutes Intravenous To Short Stay 02/27/16 1330 02/27/16 1951   02/26/16 2200  ceFAZolin (ANCEF) 3 g in dextrose 5 % 50 mL IVPB     3 g 130 mL/hr over 30 Minutes Intravenous  Once 02/26/16 2054 02/26/16 2239   02/26/16 1330  ceFAZolin (ANCEF) IVPB 1 g/50 mL premix     1 g 100 mL/hr over 30 Minutes Intravenous  Once 02/26/16 1319 02/26/16 1514      Assessment/Plan: s/p Procedure(s): OPEN REDUCTION INTERNAL FIXATION (ORIF) ACETABULAR FRACTURE (Left) IRRIGATION AND DEBRIDEMENT RIGHT KNEE (Right) Awaiting rehab bed Mobilize    LOS: 8 days    Eileen Ehlersamirez Jr., Eileen [name] 03/05/2016

## 2016-03-05 NOTE — Telephone Encounter (Signed)
RN received a referral from Dr [facility] Dr Ninetta LightsHatcher stated "would like to have her f/u with me in ID clinic.  Will have our home RN f/u with her to try to get her on drug assistance program, ensure her f/u." RN would like to meet the patient face to face during her hospitalization to offer my services and to reassure the patient that before she is discharged from the hospital I will be working on a drug assistance program along with setting up a appt with Dr Ninetta LightsHatcher in the ID clinic
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
## 2016-03-06 ENCOUNTER — Ambulatory Visit: Payer: Self-pay | Admitting: *Deleted

## 2016-03-06 DIAGNOSIS — B20 Human immunodeficiency virus [HIV] disease: Secondary | ICD-10-CM

## 2016-03-06 LAB — HIV-1 RNA ULTRAQUANT REFLEX TO GENTYP+
HIV-1 RNA BY PCR: 156000 copies/mL
HIV-1 RNA QUANT, LOG: 5.193 {Log_copies}/mL

## 2016-03-06 LAB — CBC
HEMATOCRIT: 23.9 % — AB (ref 36.0–46.0)
Hemoglobin: 7.3 g/dL — ABNORMAL LOW (ref 12.0–15.0)
MCH: 24.4 pg — ABNORMAL LOW (ref 26.0–34.0)
MCHC: 30.5 g/dL (ref 30.0–36.0)
MCV: 79.9 fL (ref 78.0–100.0)
PLATELETS: 171 10*3/uL (ref 150–400)
RBC: 2.99 MIL/uL — ABNORMAL LOW (ref 3.87–5.11)
RDW: 17.8 % — AB (ref 11.5–15.5)
WBC: 3.1 10*3/uL — AB (ref 4.0–10.5)

## 2016-03-06 LAB — REFLEX TO GENOSURE(R) MG: HIV GENOSURE(R) MG PDF: 0

## 2016-03-06 LAB — PROTIME-INR
INR: 1.7 — AB (ref 0.00–1.49)
PROTHROMBIN TIME: 19.9 s — AB (ref 11.6–15.2)

## 2016-03-06 MED ORDER — OXYCODONE HCL ER 20 MG PO T12A
20.0000 mg | EXTENDED_RELEASE_TABLET | Freq: Two times a day (BID) | ORAL | Status: DC
  Administered 2016-03-06 – 2016-03-07 (×3): 20 mg via ORAL
  Filled 2016-03-06 (×3): qty 1

## 2016-03-06 MED ORDER — WARFARIN SODIUM 5 MG PO TABS
10.0000 mg | ORAL_TABLET | Freq: Once | ORAL | Status: AC
  Administered 2016-03-06: 10 mg via ORAL
  Filled 2016-03-06: qty 2
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
## 2016-03-06 NOTE — Progress Notes (Signed)
Trauma Service Note  Subjective: Patient sitting up in chair seems to be doing well.    Objective: Vital signs in last 24 hours: Temp:  [98.2 F (36.8 C)-99.6 F (37.6 C)] 98.2 F (36.8 C) (05/16 0516) Pulse Rate:  [96-103] 103 (05/16 0516) Resp:  [17-18] 17 (05/16 0516) BP: (123-139)/(59-81) 123/59 mmHg (05/16 0516) SpO2:  [90 %-94 %] 92 % (05/16 0516) Last BM Date: 03/04/16  Intake/Output from previous day: 05/15 0701 - 05/16 0700 In: 120 [P.O.:120] Out: -  Intake/Output this shift: Total I/O In: 280 [P.O.:280] Out: -   General: No acute distress  Lungs: Clear  Abd: Soft, benign  Extremities: No changes  Neuro: Intact  Lab Results: CBC   Recent Labs  03/06/16 0543  WBC 3.1*  HGB 7.3*  HCT 23.9*  PLT 171   BMET No results for input(s): NA, K, CL, CO2, GLUCOSE, BUN, CREATININE, CALCIUM in the last 72 hours. PT/INR  Recent Labs  03/05/16 0712 03/06/16 0543  LABPROT 19.3* 19.9*  INR 1.63* 1.70*   ABG No results for input(s): PHART, HCO3 in the last 72 hours.  Invalid input(s): PCO2, PO2  Studies/Results: No results found.  Anti-infectives: Anti-infectives    Start     Dose/Rate Route Frequency Ordered Stop   03/03/16 0700  rilpivirine (EDURANT) tablet 25 mg  Status:  Discontinued     25 mg Oral Daily with breakfast 03/02/16 1034 03/02/16 1108   03/02/16 1600  ceFEPIme (MAXIPIME) 2 g in dextrose 5 % 50 mL IVPB     2 g 100 mL/hr over 30 Minutes Intravenous Every 12 hours 03/02/16 1513 03/09/16 2359   03/02/16 1200  emtricitabine-tenofovir AF (DESCOVY) 200-25 MG per tablet 1 tablet  Status:  Discontinued     1 tablet Oral Daily 03/02/16 1034 03/02/16 1108   03/02/16 1200  sulfamethoxazole-trimethoprim (BACTRIM DS,SEPTRA DS) 800-160 MG per tablet 1 tablet     1 tablet Oral Once per day on Mon Wed Fri 03/02/16 1034     03/02/16 1200  elvitegravir-cobicistat-emtricitabine-tenofovir (GENVOYA) 150-150-200-10 MG tablet 1 tablet     1 tablet Oral  Daily with breakfast 03/02/16 1109     03/01/16 1600  ceFEPIme (MAXIPIME) 1 g in dextrose 5 % 50 mL IVPB  Status:  Discontinued     1 g 100 mL/hr over 30 Minutes Intravenous Every 12 hours 03/01/16 1525 03/02/16 1513   02/28/16 0400  ceFAZolin (ANCEF) IVPB 2g/100 mL premix  Status:  Discontinued     2 g 200 mL/hr over 30 Minutes Intravenous Every 8 hours 02/27/16 2210 03/01/16 1525   02/27/16 1345  ceFAZolin (ANCEF) 3 g in dextrose 5 % 50 mL IVPB     3 g 130 mL/hr over 30 Minutes Intravenous To Short Stay 02/27/16 1330 02/27/16 1951   02/26/16 2200  ceFAZolin (ANCEF) 3 g in dextrose 5 % 50 mL IVPB     3 g 130 mL/hr over 30 Minutes Intravenous  Once 02/26/16 2054 02/26/16 2239   02/26/16 1330  ceFAZolin (ANCEF) IVPB 1 g/50 mL premix     1 g 100 mL/hr over 30 Minutes Intravenous  Once 02/26/16 1319 02/26/16 1514      Assessment/Plan: s/p Procedure(s): OPEN REDUCTION INTERNAL FIXATION (ORIF) ACETABULAR FRACTURE IRRIGATION AND DEBRIDEMENT RIGHT KNEE Discharge Hopefully will be able to go to Rehab today.  Does not need PICC line placed  LOS: 9 days   Marta LamasJames O. Gae BonWyatt, III, MD, FACS [phone] Trauma Surgeon 03/06/2016

## 2016-03-06 NOTE — Progress Notes (Signed)
Rehab admissions - I met with patient.  She tells me that she walked around the unit with PT.  She is requiring minguard assist to ambulate 35 feet.  She has no insurance.  She is concerned about getting a bill from the rehab unit.  Her best friend has offered for her to go home with her and best friend works from home.  Patient does not want to commit to inpatient rehab 5-7 days length of stay.  Patient feels she would be safe to discharge in the next day or so as she is medically ready.  Her mom is caring for her children until she gets better.  Recommend home with [facility] when medically ready.  Call me for questions.  #[phone]

## 2016-03-06 NOTE — Clinical Social Work Note (Signed)
Clinical Social Worker continuing to follow patient and family for support and discharge planning needs.  Patient has decided not to go to inpatient rehab and stay with a friend who is available to provide 24 hour support.  Patient children are being cared for by patient mother until patient is able to physically assist.  Clinical Social Worker will sign off for now as social work intervention is no longer needed. Please consult us again if new need arises.  Macario GoldsJesse Fitzpatrick [place], [place] [phone]
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
## 2016-03-07 LAB — CBC
HEMATOCRIT: 24.7 % — AB (ref 36.0–46.0)
HEMOGLOBIN: 7.6 g/dL — AB (ref 12.0–15.0)
MCH: 24.9 pg — ABNORMAL LOW (ref 26.0–34.0)
MCHC: 30.8 g/dL (ref 30.0–36.0)
MCV: 81 fL (ref 78.0–100.0)
Platelets: 182 10*3/uL (ref 150–400)
RBC: 3.05 MIL/uL — AB (ref 3.87–5.11)
RDW: 18.2 % — ABNORMAL HIGH (ref 11.5–15.5)
WBC: 2.7 10*3/uL — AB (ref 4.0–10.5)

## 2016-03-07 LAB — PROTIME-INR
INR: 1.78 — AB (ref 0.00–1.49)
Prothrombin Time: 20.6 seconds — ABNORMAL HIGH (ref 11.6–15.2)

## 2016-03-07 MED ORDER — ELVITEG-COBIC-EMTRICIT-TENOFAF 150-150-200-10 MG PO TABS: 1.0000 | ORAL_TABLET | Freq: Every day | ORAL | Status: DC

## 2016-03-07 MED ORDER — CEFADROXIL 500 MG PO CAPS: 500.0000 mg | ORAL_CAPSULE | Freq: Two times a day (BID) | ORAL | Status: DC

## 2016-03-07 MED ORDER — CHOLECALCIFEROL 50 MCG (2000 UT) PO TABS: 2000.0000 [IU] | ORAL_TABLET | Freq: Two times a day (BID) | ORAL | Status: DC

## 2016-03-07 MED ORDER — CALCIUM CITRATE 950 (200 CA) MG PO TABS: 200.0000 mg | ORAL_TABLET | Freq: Two times a day (BID) | ORAL | Status: DC

## 2016-03-07 MED ORDER — ASCORBIC ACID 500 MG PO TABS: 500.0000 mg | ORAL_TABLET | Freq: Every day | ORAL | Status: DC

## 2016-03-07 MED ORDER — OXYCODONE-ACETAMINOPHEN 10-325 MG PO TABS: 1.0000 | ORAL_TABLET | ORAL | Status: DC | PRN

## 2016-03-07 MED ORDER — AMOXICILLIN-POT CLAVULANATE 875-125 MG PO TABS: 1.0000 | ORAL_TABLET | Freq: Two times a day (BID) | ORAL | Status: AC

## 2016-03-07 MED ORDER — WARFARIN SODIUM 5 MG PO TABS: ORAL_TABLET | ORAL | Status: DC

## 2016-03-07 MED ORDER — METHOCARBAMOL 750 MG PO TABS: 750.0000 mg | ORAL_TABLET | Freq: Three times a day (TID) | ORAL | Status: DC | PRN

## 2016-03-07 MED ORDER — OXYCODONE HCL ER 20 MG PO T12A: 20.0000 mg | EXTENDED_RELEASE_TABLET | Freq: Two times a day (BID) | ORAL | Status: DC

## 2016-03-07 MED ORDER — TRAMADOL HCL 50 MG PO TABS: 100.0000 mg | ORAL_TABLET | Freq: Four times a day (QID) | ORAL | Status: DC

## 2016-03-07 MED ORDER — SULFAMETHOXAZOLE-TRIMETHOPRIM 800-160 MG PO TABS: 1.0000 | ORAL_TABLET | ORAL | Status: DC

## 2016-03-07 MED ORDER — WARFARIN SODIUM 5 MG PO TABS: 10.0000 mg | ORAL_TABLET | ORAL | Status: DC

## 2016-03-07 MED ORDER — WARFARIN SODIUM 5 MG PO TABS
10.0000 mg | ORAL_TABLET | Freq: Once | ORAL | Status: AC
  Administered 2016-03-07: 10 mg via ORAL
  Filled 2016-03-07: qty 2
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
## 2016-03-07 NOTE — Care Management Note (Addendum)
Case Management Note  Patient Details  Name: Eileen [name] MRN: [id number] Date of Birth: 10/17/1986  Subjective/Objective:   Pt for dc home today with friend, Tia.  DC address: [address]  [place], [place]  [address]  Phone:  [phone].                 Action/Plan: Pt qualifies as self pay for home therapies with current diagnosis.  [facility] and DME needs.    Start of care 24-48h post dc date.   RW and BSC to be delivered to pt's room prior to dc home prior to dc home.  Pt denies need for wheelchair.   Pt is uninsured, but qualifies for medication assistance through [facility].  [facility] with explanation of [facility] benefits.  Pt appreciative of help given.      Expected Discharge Date:    03/07/2016             Expected Discharge Plan:  Home w Home Health Services  In-House Referral:  Clinical Social Work  Discharge planning Services  CM Consult, MATCH Program, Medication Assistance  Post Acute Care Choice:  Home Health Choice offered to:  Patient  DME Arranged:  3-N-1, Walker rolling DME Agency:  [facility]  HH Arranged:  PT, OT HH Agency:  [facility]  Status of Service:  Completed, signed off  Medicare Important Message Given:    Date Medicare IM Given:    Medicare IM give by:    Date Additional Medicare IM Given:    Additional Medicare Important Message give by:     If discussed at Long Length of Stay Meetings, dates discussed:    Additional Comments:  Pt to follow up with Dr. Johny SaxJeffrey Hatcher [facility] for f/u of HIV.    Quintella BatonJulie W. Shavonda Wiedman, RN, BSN  Trauma/Neuro ICU Case Manager [phone]
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
## 2016-03-07 NOTE — Progress Notes (Signed)
Renewed all discharge papers and medications with full understanding pt waiting for ride from family member

## 2016-03-08 ENCOUNTER — Ambulatory Visit: Payer: Self-pay

## 2016-03-08 ENCOUNTER — Encounter (HOSPITAL_COMMUNITY): Payer: Self-pay | Admitting: Emergency Medicine

## 2016-03-27 ENCOUNTER — Telehealth: Payer: Self-pay | Admitting: *Deleted
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
## 2016-03-28 ENCOUNTER — Ambulatory Visit (INDEPENDENT_AMBULATORY_CARE_PROVIDER_SITE_OTHER): Payer: Self-pay | Admitting: Infectious Diseases

## 2016-03-28 ENCOUNTER — Encounter: Payer: Self-pay | Admitting: Infectious Diseases

## 2016-03-28 ENCOUNTER — Ambulatory Visit: Payer: Self-pay | Admitting: *Deleted

## 2016-03-28 VITALS — BP 146/96 | HR 89 | Temp 99.7°F | Wt 364.0 lb

## 2016-03-28 DIAGNOSIS — B20 Human immunodeficiency virus [HIV] disease: Secondary | ICD-10-CM

## 2016-03-28 NOTE — Assessment & Plan Note (Signed)
Has lost 74#. Congratulated her and encouraged her.
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
## 2016-03-28 NOTE — Assessment & Plan Note (Signed)
She is doing well, although off meds She met with ADAP coordinator today.  Will see her back in 6 weeks, after she gets her art (genvoya) Will contiue bactrim for now.

## 2016-03-28 NOTE — Assessment & Plan Note (Signed)
Appreciate f/u of multiple specialists

## 2016-04-02 NOTE — Progress Notes (Signed)
RN received a referral for Dr Ninetta LightsHatcher asking me to connect with the patient. She has been out of care since 2011. During today's visit on 03/05/2016 patient was noted to be in some discomfort but willing to meet with me. Eileen [name] agreed that she has been out of care for a while and is just kinda waiting for HIV to be her death sentence. We discussed how medications and treatment for HIV has progressed in such a way that HIV can be managed and she can leave a healthy live with being HIV positive. We took sometime to build a rapport and discussed different struggles that she is having. Before leaving I gave Eileen [name] my contact information and she asked if it would be ok for me to return the following day for us to further discuss my services and how I can possible assist her in dealing with her HIV status

## 2016-04-04 NOTE — Progress Notes (Signed)
RN returned today to meet with Ms Eileen [name]. RN explained to [place] my services and the many ways I can be useful to her. Eileen [name] explained that since finding out she is positive in 2007 the medication has been a constant reminder that her life is numbered. She explained that she is like a ticking time bomb. RN explained to the patient that each person on this earth has a certain time limit on earth. RN went on to explain to the patient that the management of HIV is not like it use to be in the 80's. Now we can offer a 1 pill regimen that is easier for the body to tolerate. RN explained to Eileen [name] that if she can get her virus undetectable then HIV will not continue to weaken her body but will stay kinda dormant in her body. Eileen [name] showed a understanding by nodding her head but did not appear reassured. During today's visit we scheduled a hospital f/u visit with Dr Ninetta Lightshatcher along with a financial counseling visit to renew her ADAP

## 2016-04-05 NOTE — Progress Notes (Signed)
RN meet with Eileen [name] today at the clinic to be sure she keeps her appt with the Financial Counselor(renewal of her ADAP) and her hospital f/u with Dr Ninetta LightsHatcher. RN sat through her visit with Dr Ninetta LightsHatcher. My thought is that Eileen [name] will need a great deal of support to be adherent to her medication. I instructed Eileen [name]  That I will be contacting her tomorrow to get the needed information(her W2) so we can process her ADAP paperwork

## 2016-04-17 DIAGNOSIS — M6140 Other calcification of muscle, unspecified site: Secondary | ICD-10-CM | POA: Insufficient documentation

## 2016-04-17 DIAGNOSIS — M9689 Other intraoperative and postprocedural complications and disorders of the musculoskeletal system: Principal | ICD-10-CM

## 2016-04-17 NOTE — Addendum Note (Signed)
Encounter addended by: Dorothy PufferJohn Kaylene Dawn, MD on: 04/17/2016  8:19 PM<BR>     Documentation filed: Notes Section, Problem List

## 2016-04-17 NOTE — Progress Notes (Signed)
  Radiation Oncology         [phone] ________________________________  Name: Eileen [name] MRN: [id number]  Date: 02/29/2016  DOB: 04-05-1986  End of Treatment Note  Diagnosis:   Postoperative heterotopic ossification     Indication for treatment:  curative       Radiation treatment dates:   02/29/2016  Site/dose:   The patient was treated to a dose of 7 Gy to the left hip. This was accomplished with AP and PA fields.  Narrative: The patient tolerated radiation treatment relatively well.   No difficulties.  Plan: The patient has completed radiation treatment. The patient will return to radiation oncology clinic on an as needed basis. ________________________________  Radene GunningJohn S. Justinian Miano, M.D., Ph.D.

## 2016-05-28 ENCOUNTER — Ambulatory Visit: Payer: Self-pay | Admitting: Infectious Diseases

## 2016-07-31 ENCOUNTER — Other Ambulatory Visit: Payer: Self-pay | Admitting: *Deleted

## 2016-07-31 ENCOUNTER — Telehealth: Payer: Self-pay | Admitting: *Deleted

## 2016-07-31 MED ORDER — ELVITEG-COBIC-EMTRICIT-TENOFAF 150-150-200-10 MG PO TABS: 1.0000 | ORAL_TABLET | Freq: Every day | ORAL | 0 refills | Status: DC

## 2016-07-31 NOTE — Telephone Encounter (Signed)
Thank you :)

## 2016-07-31 NOTE — Telephone Encounter (Signed)
Patient called stating she can not take her HIV meds because "Medicaid won't pay for them and I can't afford $3,000 a month". Asked when she last took her meds and said she can't get them. She wanted her labs done and scheduled her an appt for tomorrow. I sent the Rx for Genvoya to [facility] to initiate a PA for Genvoya. And when she comes for labs she will schedule a follow up with Dr. Ninetta LightsHatcher. Wendall MolaJacqueline Cockerham

## 2016-08-01 ENCOUNTER — Other Ambulatory Visit: Payer: Self-pay

## 2016-11-06 ENCOUNTER — Telehealth: Payer: Self-pay | Admitting: *Deleted

## 2016-11-06 ENCOUNTER — Encounter: Payer: Self-pay | Admitting: *Deleted

## 2016-11-06 NOTE — Telephone Encounter (Signed)
RN meet Eileen [name] at the grocery store by coincidence. Eileen [name] stated a lot is going on in her home. He is facing lossingher home and possibly losing her medicaid. RN offered assistance to [place] at that time and reminded her that I am aways here to help and support her. Eileen [name] stated she would give me call.  RN has not heard from [place] at this time and he name came up again during today's meeting  RN contacted Eileen [name] by phone today offering my services and requesting a call back

## 2016-11-12 ENCOUNTER — Inpatient Hospital Stay (HOSPITAL_COMMUNITY)
Admission: EM | Admit: 2016-11-12 | Discharge: 2016-11-14 | DRG: 602 | Disposition: A | Discharge status: Home / Self Care | Payer: Self-pay | Attending: Internal Medicine | Admitting: Internal Medicine

## 2016-11-12 ENCOUNTER — Encounter (HOSPITAL_COMMUNITY): Payer: Self-pay | Admitting: Emergency Medicine

## 2016-11-12 DIAGNOSIS — Z720 Tobacco use: Secondary | ICD-10-CM | POA: Diagnosis present

## 2016-11-12 DIAGNOSIS — R000 Tachycardia, unspecified: Secondary | ICD-10-CM | POA: Diagnosis present

## 2016-11-12 DIAGNOSIS — F17210 Nicotine dependence, cigarettes, uncomplicated: Secondary | ICD-10-CM | POA: Diagnosis present

## 2016-11-12 DIAGNOSIS — Z7901 Long term (current) use of anticoagulants: Secondary | ICD-10-CM

## 2016-11-12 DIAGNOSIS — L0390 Cellulitis, unspecified: Secondary | ICD-10-CM | POA: Diagnosis present

## 2016-11-12 DIAGNOSIS — Z833 Family history of diabetes mellitus: Secondary | ICD-10-CM

## 2016-11-12 DIAGNOSIS — F1010 Alcohol abuse, uncomplicated: Secondary | ICD-10-CM | POA: Diagnosis present

## 2016-11-12 DIAGNOSIS — Z888 Allergy status to other drugs, medicaments and biological substances status: Secondary | ICD-10-CM

## 2016-11-12 DIAGNOSIS — Z79899 Other long term (current) drug therapy: Secondary | ICD-10-CM

## 2016-11-12 DIAGNOSIS — R21 Rash and other nonspecific skin eruption: Secondary | ICD-10-CM | POA: Diagnosis present

## 2016-11-12 DIAGNOSIS — Z9889 Other specified postprocedural states: Secondary | ICD-10-CM

## 2016-11-12 DIAGNOSIS — I10 Essential (primary) hypertension: Secondary | ICD-10-CM | POA: Diagnosis present

## 2016-11-12 DIAGNOSIS — E669 Obesity, unspecified: Secondary | ICD-10-CM | POA: Diagnosis present

## 2016-11-12 DIAGNOSIS — E039 Hypothyroidism, unspecified: Secondary | ICD-10-CM | POA: Diagnosis present

## 2016-11-12 DIAGNOSIS — M25572 Pain in left ankle and joints of left foot: Secondary | ICD-10-CM

## 2016-11-12 DIAGNOSIS — F1910 Other psychoactive substance abuse, uncomplicated: Secondary | ICD-10-CM | POA: Diagnosis present

## 2016-11-12 DIAGNOSIS — Z882 Allergy status to sulfonamides status: Secondary | ICD-10-CM

## 2016-11-12 DIAGNOSIS — Z6841 Body Mass Index (BMI) 40.0 and over, adult: Secondary | ICD-10-CM

## 2016-11-12 DIAGNOSIS — B20 Human immunodeficiency virus [HIV] disease: Secondary | ICD-10-CM | POA: Diagnosis present

## 2016-11-12 DIAGNOSIS — Z9851 Tubal ligation status: Secondary | ICD-10-CM

## 2016-11-12 DIAGNOSIS — L03116 Cellulitis of left lower limb: Principal | ICD-10-CM | POA: Diagnosis present

## 2016-11-12 DIAGNOSIS — D61818 Other pancytopenia: Secondary | ICD-10-CM | POA: Diagnosis present

## 2016-11-12 DIAGNOSIS — E785 Hyperlipidemia, unspecified: Secondary | ICD-10-CM | POA: Diagnosis present

## 2016-11-12 DIAGNOSIS — Z8632 Personal history of gestational diabetes: Secondary | ICD-10-CM

## 2016-11-12 NOTE — ED Triage Notes (Signed)
Pt has a red rash noted to her lower legs  Pt states it started 2 days ago  Pt states it burns and itches  Pt has swelling noted to her lower extremities and states her left ankle is painful

## 2016-11-13 ENCOUNTER — Inpatient Hospital Stay (HOSPITAL_COMMUNITY): Payer: Self-pay

## 2016-11-13 ENCOUNTER — Telehealth: Payer: Self-pay | Admitting: *Deleted

## 2016-11-13 DIAGNOSIS — Z833 Family history of diabetes mellitus: Secondary | ICD-10-CM

## 2016-11-13 DIAGNOSIS — F17210 Nicotine dependence, cigarettes, uncomplicated: Secondary | ICD-10-CM

## 2016-11-13 DIAGNOSIS — M25572 Pain in left ankle and joints of left foot: Secondary | ICD-10-CM | POA: Insufficient documentation

## 2016-11-13 DIAGNOSIS — Z720 Tobacco use: Secondary | ICD-10-CM

## 2016-11-13 DIAGNOSIS — Z598 Other problems related to housing and economic circumstances: Secondary | ICD-10-CM

## 2016-11-13 DIAGNOSIS — L0390 Cellulitis, unspecified: Secondary | ICD-10-CM | POA: Diagnosis present

## 2016-11-13 DIAGNOSIS — R21 Rash and other nonspecific skin eruption: Secondary | ICD-10-CM | POA: Diagnosis present

## 2016-11-13 DIAGNOSIS — F1010 Alcohol abuse, uncomplicated: Secondary | ICD-10-CM

## 2016-11-13 DIAGNOSIS — Z888 Allergy status to other drugs, medicaments and biological substances status: Secondary | ICD-10-CM

## 2016-11-13 DIAGNOSIS — L03115 Cellulitis of right lower limb: Secondary | ICD-10-CM

## 2016-11-13 DIAGNOSIS — Z882 Allergy status to sulfonamides status: Secondary | ICD-10-CM

## 2016-11-13 DIAGNOSIS — L03116 Cellulitis of left lower limb: Secondary | ICD-10-CM | POA: Diagnosis present

## 2016-11-13 LAB — COMPREHENSIVE METABOLIC PANEL
ALBUMIN: 2.9 g/dL — AB (ref 3.5–5.0)
ALK PHOS: 68 U/L (ref 38–126)
ALT: 9 U/L — AB (ref 14–54)
ANION GAP: 5 (ref 5–15)
AST: 18 U/L (ref 15–41)
BILIRUBIN TOTAL: 0.8 mg/dL (ref 0.3–1.2)
BUN: 10 mg/dL (ref 6–20)
CALCIUM: 8.3 mg/dL — AB (ref 8.9–10.3)
CO2: 31 mmol/L (ref 22–32)
CREATININE: 0.77 mg/dL (ref 0.44–1.00)
Chloride: 98 mmol/L — ABNORMAL LOW (ref 101–111)
GFR calc Af Amer: >60 mL/min (ref >60)
GFR calc non Af Amer: >60 mL/min (ref >60)
GLUCOSE: 95 mg/dL (ref 65–99)
Potassium: 3.9 mmol/L (ref 3.5–5.1)
Sodium: 134 mmol/L — ABNORMAL LOW (ref 135–145)
TOTAL PROTEIN: 7.7 g/dL (ref 6.5–8.1)

## 2016-11-13 LAB — CBC WITH DIFFERENTIAL/PLATELET
BASOS PCT: 0 %
Basophils Absolute: 0 10*3/uL (ref 0.0–0.1)
Eosinophils Absolute: 0.1 10*3/uL (ref 0.0–0.7)
Eosinophils Relative: 2 %
HEMATOCRIT: 26.4 % — AB (ref 36.0–46.0)
Hemoglobin: 8.2 g/dL — ABNORMAL LOW (ref 12.0–15.0)
LYMPHS ABS: 1.2 10*3/uL (ref 0.7–4.0)
Lymphocytes Relative: 37 %
MCH: 23.5 pg — ABNORMAL LOW (ref 26.0–34.0)
MCHC: 31.1 g/dL (ref 30.0–36.0)
MCV: 75.6 fL — ABNORMAL LOW (ref 78.0–100.0)
MONOS PCT: 15 %
Monocytes Absolute: 0.5 10*3/uL (ref 0.1–1.0)
NEUTROS ABS: 1.5 10*3/uL — AB (ref 1.7–7.7)
NEUTROS PCT: 46 %
Platelets: 146 10*3/uL — ABNORMAL LOW (ref 150–400)
RBC: 3.49 MIL/uL — AB (ref 3.87–5.11)
RDW: 16.9 % — ABNORMAL HIGH (ref 11.5–15.5)
WBC: 3.2 10*3/uL — ABNORMAL LOW (ref 4.0–10.5)

## 2016-11-13 LAB — PROTIME-INR
INR: 1.03
Prothrombin Time: 13.5 seconds (ref 11.4–15.2)

## 2016-11-13 LAB — RAPID URINE DRUG SCREEN, HOSP PERFORMED
Amphetamines: NOT DETECTED
BARBITURATES: NOT DETECTED
Benzodiazepines: NOT DETECTED
Cocaine: NOT DETECTED
Opiates: POSITIVE — AB
Tetrahydrocannabinol: NOT DETECTED

## 2016-11-13 LAB — T-HELPER CELLS (CD4) COUNT (NOT AT ARMC)
CD4 % Helper T Cell: 21 % — ABNORMAL LOW (ref 33–55)
CD4 T CELL ABS: 240 /uL — AB (ref 400–2700)

## 2016-11-13 LAB — URINALYSIS, ROUTINE W REFLEX MICROSCOPIC
BILIRUBIN URINE: NEGATIVE
Glucose, UA: NEGATIVE mg/dL
HGB URINE DIPSTICK: NEGATIVE
Ketones, ur: NEGATIVE mg/dL
Nitrite: NEGATIVE
PH: 5 (ref 5.0–8.0)
Protein, ur: NEGATIVE mg/dL
SPECIFIC GRAVITY, URINE: 1.019 (ref 1.005–1.030)

## 2016-11-13 LAB — PROCALCITONIN: Procalcitonin: <0.1 ng/mL

## 2016-11-13 LAB — HCG, QUANTITATIVE, PREGNANCY

## 2016-11-13 LAB — C-REACTIVE PROTEIN: CRP: 4.9 mg/dL — AB (ref <1.0)

## 2016-11-13 LAB — RPR: RPR Ser Ql: NONREACTIVE

## 2016-11-13 LAB — APTT: aPTT: 27 seconds (ref 24–36)

## 2016-11-13 LAB — LACTIC ACID, PLASMA: Lactic Acid, Venous: 0.6 mmol/L (ref 0.5–1.9)

## 2016-11-13 LAB — SEDIMENTATION RATE: Sed Rate: 60 mm/hr — ABNORMAL HIGH (ref 0–22)

## 2016-11-13 MED ORDER — ELVITEG-COBIC-EMTRICIT-TENOFAF 150-150-200-10 MG PO TABS: 1.0000 | ORAL_TABLET | Freq: Every day | ORAL | Status: DC

## 2016-11-13 MED ORDER — ZOLPIDEM TARTRATE 5 MG PO TABS: 5.0000 mg | ORAL_TABLET | Freq: Every evening | ORAL | Status: DC | PRN

## 2016-11-13 MED ORDER — OXYCODONE-ACETAMINOPHEN 10-325 MG PO TABS: 1.0000 | ORAL_TABLET | ORAL | Status: DC | PRN

## 2016-11-13 MED ORDER — DARUNAVIR ETHANOLATE 800 MG PO TABS
800.0000 mg | ORAL_TABLET | Freq: Every day | ORAL | Status: DC
  Administered 2016-11-13: 800 mg via ORAL
  Filled 2016-11-13 (×2): qty 1

## 2016-11-13 MED ORDER — ENOXAPARIN SODIUM 60 MG/0.6ML ~~LOC~~ SOLN
60.0000 mg | SUBCUTANEOUS | Status: DC
  Filled 2016-11-13: qty 0.6

## 2016-11-13 MED ORDER — SODIUM CHLORIDE 0.9 % IV SOLN
INTRAVENOUS | Status: DC
  Administered 2016-11-13 – 2016-11-14 (×4): via INTRAVENOUS

## 2016-11-13 MED ORDER — HYDRALAZINE HCL 20 MG/ML IJ SOLN: 5.0000 mg | INTRAMUSCULAR | Status: DC | PRN

## 2016-11-13 MED ORDER — ONDANSETRON HCL 4 MG/2ML IJ SOLN: 4.0000 mg | Freq: Four times a day (QID) | INTRAMUSCULAR | Status: DC | PRN

## 2016-11-13 MED ORDER — ENOXAPARIN SODIUM 80 MG/0.8ML ~~LOC~~ SOLN
80.0000 mg | SUBCUTANEOUS | Status: DC
  Administered 2016-11-13 – 2016-11-14 (×2): 80 mg via SUBCUTANEOUS
  Filled 2016-11-13 (×3): qty 0.8

## 2016-11-13 MED ORDER — ACETAMINOPHEN 325 MG PO TABS: 650.0000 mg | ORAL_TABLET | Freq: Four times a day (QID) | ORAL | Status: DC | PRN

## 2016-11-13 MED ORDER — ELVITEG-COBIC-EMTRICIT-TENOFAF 150-150-200-10 MG PO TABS
1.0000 | ORAL_TABLET | Freq: Every day | ORAL | Status: DC
  Administered 2016-11-13: 1 via ORAL
  Filled 2016-11-13 (×2): qty 1

## 2016-11-13 MED ORDER — LABETALOL HCL 300 MG PO TABS
300.0000 mg | ORAL_TABLET | Freq: Two times a day (BID) | ORAL | Status: DC
  Administered 2016-11-13 – 2016-11-14 (×3): 300 mg via ORAL
  Filled 2016-11-13 (×4): qty 1

## 2016-11-13 MED ORDER — DIPHENHYDRAMINE HCL 25 MG PO CAPS
25.0000 mg | ORAL_CAPSULE | Freq: Once | ORAL | Status: AC
  Administered 2016-11-13: 25 mg via ORAL
  Filled 2016-11-13: qty 1

## 2016-11-13 MED ORDER — DOXYCYCLINE HYCLATE 100 MG PO TABS
100.0000 mg | ORAL_TABLET | Freq: Two times a day (BID) | ORAL | Status: DC
  Administered 2016-11-13 (×2): 100 mg via ORAL
  Filled 2016-11-13 (×2): qty 1

## 2016-11-13 MED ORDER — DARUNAVIR ETHANOLATE 800 MG PO TABS: 800.0000 mg | ORAL_TABLET | Freq: Every day | ORAL | Status: DC

## 2016-11-13 MED ORDER — SODIUM CHLORIDE 0.9 % IV BOLUS (SEPSIS)
2000.0000 mL | Freq: Once | INTRAVENOUS | Status: AC
  Administered 2016-11-13: 2000 mL via INTRAVENOUS

## 2016-11-13 MED ORDER — ACETAMINOPHEN 650 MG RE SUPP: 650.0000 mg | Freq: Four times a day (QID) | RECTAL | Status: DC | PRN

## 2016-11-13 MED ORDER — DIPHENHYDRAMINE HCL 50 MG/ML IJ SOLN: 25.0000 mg | Freq: Once | INTRAMUSCULAR | Status: DC

## 2016-11-13 MED ORDER — OXYCODONE HCL 5 MG PO TABS: 5.0000 mg | ORAL_TABLET | ORAL | Status: DC | PRN

## 2016-11-13 MED ORDER — OXYCODONE-ACETAMINOPHEN 5-325 MG PO TABS: 1.0000 | ORAL_TABLET | ORAL | Status: DC | PRN

## 2016-11-13 MED ORDER — CEFAZOLIN IN D5W 1 GM/50ML IV SOLN
1.0000 g | Freq: Three times a day (TID) | INTRAVENOUS | Status: DC
  Administered 2016-11-13 – 2016-11-14 (×4): 1 g via INTRAVENOUS
  Filled 2016-11-13 (×5): qty 50

## 2016-11-13 MED ORDER — NICOTINE 21 MG/24HR TD PT24
21.0000 mg | MEDICATED_PATCH | Freq: Every day | TRANSDERMAL | Status: DC
  Filled 2016-11-13: qty 1

## 2016-11-13 MED ORDER — ONDANSETRON HCL 4 MG PO TABS: 4.0000 mg | ORAL_TABLET | Freq: Four times a day (QID) | ORAL | Status: DC | PRN

## 2016-11-13 MED ORDER — IBUPROFEN 200 MG PO TABS
600.0000 mg | ORAL_TABLET | Freq: Four times a day (QID) | ORAL | Status: DC | PRN
Start: 2016-11-13 — End: 2016-11-14

## 2016-11-13 MED ORDER — DIPHENHYDRAMINE HCL 25 MG PO CAPS: 25.0000 mg | ORAL_CAPSULE | Freq: Three times a day (TID) | ORAL | Status: DC | PRN

## 2016-11-13 NOTE — H&P (Signed)
History and Physical    Eileen [name] QHU:[id number] DOB: [birth year] DOA: 11/12/2016  Referring MD/NP/PA:   PCP: Eileen Rumpf, MD   Patient coming from:  The patient is coming from home.  At baseline, pt is independent for most of ADL.    Chief Complaint: rash and left ankle and leg pain   HPI: Eileen [name] is a 31 y.o. female with medical history significant of hypertension, hyperlipidemia, hypothyroidism, anxiety, syphilis, HIV (CD4 180, VL 5193 on 02/28/16), polysubstance abuse (alcohol, tobacco), morbid obesity, who presents with rash in legs and the left ankle and leg pain.  Pt states that she noted rashes in both lower extremities 2 days ago. The rashes are burning and itchy per patient. Pt also report left ankle and leg pain. Her left ankle is swelling. Pt denies previous trauma to L ankle. Patient does not have a fever or chills. Pt has tried hydrocortisone cream, benadryl and antibiotic ointment on the rash for the last couple of days without relief of symptoms. No recent exposure to new lotions, perfumes or meds. Patient denies chest pain, shortness rest, cough, nausea, vomiting, abdominal pain, diarrhea, symptoms of UTI or unilateral weakness. Of note, pt last saw ID doctor (Dr.Hatcher) 6 months ago, she is to return to ID clinic after re-starting ART.  Pt states she has been off her ART meds since she has been struggling to get Medicaid and has not been able to get approved.    ED Course: pt was found to have temperature 98.8, tachycardia, O2 sat 95% on room air. Pending CBC, BMP and urinalysis.  Review of Systems:   General: no fevers, chills, no changes in body weight, has fatigue HEENT: no blurry vision, hearing changes or sore throat Respiratory: no dyspnea, coughing, wheezing CV: no chest pain, no palpitations GI: no nausea, vomiting, abdominal pain, diarrhea, constipation GU: no dysuria, burning on urination, increased urinary frequency, hematuria  Ext: has leg  edema Neuro: no unilateral weakness, numbness, or tingling, no vision change or hearing loss Skin: has rashes in both legs. MSK: has right ankle pain and swelling. Heme: No easy bruising.  Travel history: No recent long distant travel.  Allergy:  Allergies  Allergen Reactions  . Sulfonamide Derivatives Anaphylaxis  . Other Other (See Comments)    Pt states that she is allergic to steroids, but does not know the reaction.    . Other Other (See Comments)    Steroids, unknown reaction  . Sulfa Antibiotics Other (See Comments)    Unknown, Childhood reaction 03/06/16: so far tolerating Bactrim w/o issues    Past Medical History:  Diagnosis Date  . Anesthesia complication-postpartum    stopped breathing after spinal with c/s, in ICU  . Anxiety   . Chronic hypertension during pregnancy, antepartum   . Chronic hypertension in pregnancy   . Complication of anesthesia   . Genital herpes   . Gestational diabetes    resolved - Hx with 1st preg but not with 2nd or this preganacy  . H/O ETOH abuse   . HIV (human immunodeficiency virus infection) ([place])   . HIV infection ([place])   . Hypertension    with pregnancy  . HYPERTENSION   . Hypothyroidism   . Obesity   . PONV (postoperative nausea and vomiting)   . Preexisting hypertension complicating pregnancy in second trimester, antepartum   . Syphilis    was treated  . Thyroid disease     Past Surgical History:  Procedure Laterality Date  .  CESAREAN SECTION    . CESAREAN SECTION N/A 07/07/2013   Procedure: REPEAT CESAREAN SECTION, ;  Surgeon: Guss Bunde, MD;  Location: [facility];  Service: Obstetrics;  Laterality: N/A;  . CESAREAN SECTION WITH BILATERAL TUBAL LIGATION Bilateral 06/13/2015   Procedure: REPEAT CESAREAN SECTION WITH BILATERAL TUBAL LIGATION;  Surgeon: Truett Mainland, DO;  Location: [facility];  Service: Obstetrics;  Laterality: Bilateral;  Requested 06/13/15 @ 2:45p  . I&D EXTREMITY Right 02/27/2016   Procedure: IRRIGATION AND  DEBRIDEMENT RIGHT KNEE;  Surgeon: Altamese Carter, MD;  Location: [facility];  Service: Orthopedics;  Laterality: Right;  . ORIF ACETABULAR FRACTURE Left 02/27/2016   Procedure: OPEN REDUCTION INTERNAL FIXATION (ORIF) ACETABULAR FRACTURE;  Surgeon: Altamese [place], MD;  Location: [facility];  Service: Orthopedics;  Laterality: Left;  . TUBAL LIGATION    . WISDOM TOOTH EXTRACTION      Social History:  reports that she has been smoking Cigarettes.  She has a 6.00 pack-year smoking history. She has never used smokeless tobacco. She reports that she does not drink alcohol or use drugs.  Family History:  Family History  Problem Relation Age of Onset  . Diabetes Maternal Grandmother   . Anesthesia problems Neg Hx   . Hypotension Neg Hx   . Malignant hyperthermia Neg Hx   . Pseudochol deficiency Neg Hx      Prior to Admission medications   Medication Sig Start Date End Date Taking? Authorizing Provider  acetaminophen (TYLENOL) 500 MG tablet Take 1,500 mg by mouth every 4 (four) hours as needed for moderate pain. Reported on 03/28/2016    Historical Provider, MD  albuterol (PROVENTIL HFA;VENTOLIN HFA) 108 (90 BASE) MCG/ACT inhaler Inhale 2 puffs into the lungs every 6 (six) hours as needed for wheezing or shortness of breath. Patient not taking: Reported on 03/28/2016 05/02/15   Osborne Oman, MD  calcium citrate (CALCITRATE - DOSED IN MG ELEMENTAL CALCIUM) 950 MG tablet Take 1 tablet (200 mg of elemental calcium total) by mouth 2 (two) times daily. Patient not taking: Reported on 03/28/2016 03/07/16   Ainsley Spinner, PA-C  cholecalciferol 2000 units TABS Take 1 tablet (2,000 Units total) by mouth 2 (two) times daily. Patient not taking: Reported on 03/28/2016 03/07/16   Ainsley Spinner, PA-C  Darunavir Ethanolate (PREZISTA) 800 MG tablet Take 1 tablet (800 mg total) by mouth daily. 04/05/15   Thayer Headings, MD  docusate sodium (COLACE) 100 MG capsule Take 1 capsule (100 mg total) by mouth 2 (two) times daily. 06/15/15    Caren Macadam, MD  elvitegravir-cobicistat-emtricitabine-tenofovir (GENVOYA) 150-150-200-10 MG TABS tablet Take 1 tablet by mouth daily with breakfast. 07/31/16   Campbell Riches, MD  emtricitabine-tenofovir (TRUVADA) 200-300 MG per tablet Take 1 tablet by mouth daily. Patient not taking: Reported on 03/28/2016 04/05/15   Thayer Headings, MD  ibuprofen (ADVIL,MOTRIN) 600 MG tablet Take 1 tablet (600 mg total) by mouth every 6 (six) hours. Patient taking differently: Take 600 mg by mouth every 6 (six) hours as needed for mild pain.  06/15/15   Caren Macadam, MD  labetalol (NORMODYNE) 300 MG tablet Take 1 tablet (300 mg total) by mouth 2 (two) times daily. 06/02/15   Donnamae Jude, MD  methocarbamol (ROBAXIN) 750 MG tablet Take 1 tablet (750 mg total) by mouth every 8 (eight) hours as needed for muscle spasms. Patient not taking: Reported on 03/28/2016 03/07/16   Lisette Abu, PA-C  oxyCODONE (OXYCONTIN) 20 mg 12 hr tablet  Take 1 tablet (20 mg total) by mouth every 12 (twelve) hours. Patient not taking: Reported on 03/28/2016 03/07/16   Lisette Abu, PA-C  oxyCODONE-acetaminophen (PERCOCET) 10-325 MG tablet Take 1-2 tablets by mouth every 4 (four) hours as needed for pain. 03/07/16   Lisette Abu, PA-C  oxyCODONE-acetaminophen (PERCOCET/ROXICET) 5-325 MG per tablet Take 1 tablet by mouth every 6 (six) hours as needed (for pain scale 4-7). 06/15/15   Caren Macadam, MD  ritonavir (NORVIR) 100 MG TABS tablet Take 1 tablet (100 mg total) by mouth daily. Patient not taking: Reported on 03/28/2016 04/05/15   Thayer Headings, MD  sulfamethoxazole-trimethoprim (BACTRIM DS,SEPTRA DS) 800-160 MG tablet Take 1 tablet by mouth 3 (three) times a week. 03/07/16   Lisette Abu, PA-C  traMADol (ULTRAM) 50 MG tablet Take 2 tablets (100 mg total) by mouth every 6 (six) hours. 03/07/16   Lisette Abu, PA-C  vitamin C (VITAMIN C) 500 MG tablet Take 1 tablet (500 mg total) by mouth  daily. Patient not taking: Reported on 03/28/2016 03/07/16   Ainsley Spinner, PA-C  warfarin (COUMADIN) 5 MG tablet Take as directed to keep INR between 2 and 3 03/07/16   Lisette Abu, PA-C    Physical Exam: Vitals:   11/12/16 2213 11/12/16 2214 11/13/16 0326  BP: 154/77  162/74  Pulse: 108  99  Resp: 16  18  Temp: 98.8 F (37.1 C)    TempSrc: Oral    SpO2: 95%  99%  Weight:  (!) 172.4 kg (380 lb)   Height:  _0  (1.778 m)    General: Not in acute distress HEENT:       Eyes: PERRL, EOMI, no scleral icterus.       ENT: No discharge from the ears and nose, no pharynx injection, no tonsillar enlargement.        Neck: No JVD, no bruit, no mass felt. Heme: No neck lymph node enlargement. Cardiac: S1/S2, RRR, No murmurs, No gallops or rubs. Respiratory: No rales, wheezing, rhonchi or rubs. GI: Soft, nondistended, nontender, no rebound pain, no organomegaly, BS present. GU: No hematuria Ext: 2+DP/PT pulse bilaterally. Has erythema, warmth and swelling and tenderness in left lower leg Musculoskeletal: has left ankle swelling and tenderness.. Skin: has papular, erythematous, tender, non blanching rash in lower extremities, most significant at L lateral ankle. Neuro: Alert, oriented X3, cranial nerves II-XII grossly intact, moves all extremities normally.  sych: Patient is not psychotic, no suicidal or hemocidal ideation.  Labs on Admission: I have personally reviewed following labs and imaging studies  CBC: No results for input(s): WBC, NEUTROABS, HGB, HCT, MCV, PLT in the last 168 hours. Basic Metabolic Panel: No results for input(s): NA, K, CL, CO2, GLUCOSE, BUN, CREATININE, CALCIUM, MG, PHOS in the last 168 hours. GFR: CrCl cannot be calculated (Patient's most recent lab result is older than the maximum 21 days allowed.). Liver Function Tests: No results for input(s): AST, ALT, ALKPHOS, BILITOT, PROT, ALBUMIN in the last 168 hours. No results for input(s): LIPASE, AMYLASE in the  last 168 hours. No results for input(s): AMMONIA in the last 168 hours. Coagulation Profile: No results for input(s): INR, PROTIME in the last 168 hours. Cardiac Enzymes: No results for input(s): CKTOTAL, CKMB, CKMBINDEX, TROPONINI in the last 168 hours. BNP (last 3 results) No results for input(s): PROBNP in the last 8760 hours. HbA1C: No results for input(s): HGBA1C in the last 72 hours. CBG: No results for input(s): GLUCAP in  the last 168 hours. Lipid Profile: No results for input(s): CHOL, HDL, LDLCALC, TRIG, CHOLHDL, LDLDIRECT in the last 72 hours. Thyroid Function Tests: No results for input(s): TSH, T4TOTAL, FREET4, T3FREE, THYROIDAB in the last 72 hours. Anemia Panel: No results for input(s): VITAMINB12, FOLATE, FERRITIN, TIBC, IRON, RETICCTPCT in the last 72 hours. Urine analysis:    Component Value Date/Time   COLORURINE AMBER (A) 02/28/2016 1125   APPEARANCEUR TURBID (A) 02/28/2016 1125   LABSPEC 1.027 02/28/2016 1125   PHURINE 5.5 02/28/2016 1125   GLUCOSEU NEGATIVE 02/28/2016 1125   HGBUR NEGATIVE 02/28/2016 1125   HGBUR negative 04/17/2010 1457   BILIRUBINUR NEGATIVE 02/28/2016 1125   KETONESUR NEGATIVE 02/28/2016 1125   PROTEINUR NEGATIVE 02/28/2016 1125   UROBILINOGEN 0.2 06/02/2015 1447   NITRITE NEGATIVE 02/28/2016 1125   LEUKOCYTESUR NEGATIVE 02/28/2016 1125   Sepsis Labs: _0 (procalcitonin:4,lacticidven:4) )No results found for this or any previous visit (from the past 240 hour(s)).   Radiological Exams on Admission: No results found.   EKG: Not done in ED, will get one.   Assessment/Plan Principal Problem:   Cellulitis Active Problems:   Human immunodeficiency virus (HIV) disease ([place])   Morbid obesity ([place])   Essential hypertension   Alcohol abuse   Polysubstance abuse   Tobacco abuse   Rash   Left leg cellulitis  Left leg cellulitis: pt has erythema, warmth, swelling and tenderness in left lower leg, indicating possible  cellulitis. ID was consulted (Dr. Baxter Flattery) by EDP-->recommends admission for IV antibiotics. Pending CBC, lactic acid. No fever or chills. Currently hemodynamically stable  -will admit to med-surg bed as inpt - pt was started with Ancef and doxycycline in ED, will continue  - PRN Percocet for pain - Blood cultures x 2  - ESR and CRP - will get X-ray of left ankle - will get Procalcitonin and trend lactic acid levels  - IVF: 2 L of NS bolus in ED, followed by 100 cc/h  Rash in lower extremities: Etiology is not clear. DD include disseminated folliculitis, Kaposi sarcoma, syphillis given - may need biopsy to confirm the diagnosis. -prn Benadryl for itching  Human immunodeficiency virus (HIV) disease ([place]): CD4 180, VL 5193 on 02/28/16. Pt is not taking her HIV meds due to lack of insurance -Consult to case manager -check UA, RPR, CD4, viral load, GC chlamydia.  HTN: -continue home albuterol -IV hydralazine when necessary  Hx of tobacco abuse and Alcohol abuse: pt states that she did not drink alcohol for more than one month. No sign of withdraw. -Did counseling about importance of quitting smoking -Nicotine patch   DVT ppx: SQ Lovenox Code Status: Full code Family Communication: None at bed side.  Disposition Plan:  Anticipate discharge back to previous home environment Consults called: ID, Dr Baxter Flattery  Admission status: medical floor/inpt   Date of Service 11/13/2016    Ivor Costa [facility] Pager [phone]  If 7PM-7AM, please contact night-coverage [email or web address] Password [facility] 11/13/2016, 3:38 AM

## 2016-11-13 NOTE — Progress Notes (Signed)
Patient was admitted overnight after midnight and H and P has been reviewed and am in agreement with the current Assessment and Plan. Patient is a 31 year old morbidly obese Caucasian female with a PMH of HTN, HLDm Hypothyroidism, Anxiety, Syphilis, HIV off of HAART Therapy, Poly substance abuse, Morbid obesity who [facility] Pain. Infectious Diseases Dr. Judyann Munsonynthia Snider was consulted and recommended continuing Cefazolin for today and stop doxycyline. 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 She has also started patient back on Anti-Retroviral therapy with Genovya and Darunavir. Patient seen today and had a very guarded affect and states erythema is slightly improved but rash has worsened. Will continue to follow Blood Cx and Bloodwork ordered by Infectious Diseases. Left Ankle X-Ray ordered this AM showed no acute osseous abnormality about the left ankle but did show diffuse soft tissue swelling about the lower left leg/ankle. Will repeat blood work and closely follow patient's clinical response to Abx and ART started by Infectious Diseases.
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
## 2016-11-13 NOTE — Consult Note (Signed)
[facility]  Total days of antibiotics 1        Day 1 cefazolin        Day 1 doxy               Reason for Consult: hiv infection, not on ART, with rash   Referring Physician: sheikh  Principal Problem:   Cellulitis Active Problems:   Human immunodeficiency virus (HIV) disease ([place])   Morbid obesity ([place])   Essential hypertension   Alcohol abuse   Polysubstance abuse   Tobacco abuse   Rash   Left leg cellulitis    HPI: Eileen [name] is a 31 y.o. female with hiv disease, off of ART, who was last seen in clinic in the Summer of 2017 who reports new onset of bilateral lower extremity rash with swelling of her legs in the last 2 days prior to admit, onset all at once. She denies any recent trauma. She has used topical creams such as hydrocortisone, and antibiotic creams without relief. She is afebrile, no leukocytosis. She was started on cefazolin for cellulitis. She has been off of ART for several months, generally is on ART during pregnancies then stops thereafter. She has been seeing Dr hatcher in the [facility] last summer, also has home health outreach nurse checking in on her, last note suggest, loss of housing and [employer]. Rash is described as numerous papulses non blanching some coalesce into plaques. Only on legs. Not involving palms or soles. Picture in chart shows slight erythema to the background of the left leg in addn to papules  No new drugs or detergent exposure.  Sochx: recently moved in with her mother, thus her and 5 kids with her mother Past Medical History:  Diagnosis Date  . Anesthesia complication-postpartum    stopped breathing after spinal with c/s, in ICU  . Anxiety   . Chronic hypertension during pregnancy, antepartum   . Chronic hypertension in pregnancy   . Complication of anesthesia   . Genital herpes   . Gestational diabetes    resolved - Hx with 1st preg but not with 2nd or this preganacy  . H/O ETOH abuse     . HIV (human immunodeficiency virus infection) ([place])   . HIV infection ([place])   . Hypertension    with pregnancy  . HYPERTENSION   . Hypothyroidism   . Obesity   . PONV (postoperative nausea and vomiting)   . Preexisting hypertension complicating pregnancy in second trimester, antepartum   . Syphilis    was treated  . Thyroid disease     Allergies:  Allergies  Allergen Reactions  . Sulfonamide Derivatives Anaphylaxis  . Other Other (See Comments)    Pt states that she is allergic to steroids, but does not know the reaction.    . Other Other (See Comments)    Steroids, unknown reaction  . Sulfa Antibiotics Other (See Comments)    Unknown, Childhood reaction 03/06/16: so far tolerating Bactrim w/o issues     MEDICATIONS: .  ceFAZolin (ANCEF) IV  1 g Intravenous Q8H  . darunavir  800 mg Oral Q supper  . doxycycline  100 mg Oral Q12H  . elvitegravir-cobicistat-emtricitabine-tenofovir  1 tablet Oral Q supper  . enoxaparin (LOVENOX) injection  80 mg Subcutaneous Q24H  . labetalol  300 mg Oral BID  . nicotine  21 mg Transdermal Daily    Social History  Substance Use Topics  . Smoking status: Current Every  Day Smoker    Packs/day: 0.50    Years: 12.00    Types: Cigarettes  . Smokeless tobacco: Never Used  . Alcohol use No    Family History  Problem Relation Age of Onset  . Diabetes Maternal Grandmother   . Anesthesia problems Neg Hx   . Hypotension Neg Hx   . Malignant hyperthermia Neg Hx   . Pseudochol deficiency Neg Hx    Review of Systems  Constitutional: Negative for fever, chills, diaphoresis, activity change, appetite change, fatigue and unexpected weight change.  HENT: Negative for congestion, sore throat, rhinorrhea, sneezing, trouble swallowing and sinus pressure.  Eyes: Negative for photophobia and visual disturbance.  Respiratory: Negative for cough, chest tightness, shortness of breath, wheezing and stridor.  Cardiovascular: Negative for chest pain,  palpitations and leg swelling.  Gastrointestinal: Negative for nausea, vomiting, abdominal pain, diarrhea, constipation, blood in stool, abdominal distention and anal bleeding.  Genitourinary: Negative for dysuria, hematuria, flank pain and difficulty urinating.  Musculoskeletal: leg swelling. Negative for myalgias, back pain, joint swelling, arthralgias and gait problem.  Skin:+pruritic rash Negative for color change, pallor, rash and wound.  Neurological: Negative for dizziness, tremors, weakness and light-headedness.  Hematological: Negative for adenopathy. Does not bruise/bleed easily.  Psychiatric/Behavioral: poor sleep. Negative for behavioral problems, confusion, sleep disturbance, dysphoric mood, decreased concentration and agitation.     OBJECTIVE: Temp:  [98.3 F (36.8 C)-99.6 F (37.6 C)] 98.3 F (36.8 C) (01/23 1330) Pulse Rate:  [77-108] 77 (01/23 1330) Resp:  [12-18] 14 (01/23 1330) BP: (104-162)/(55-83) 130/74 (01/23 1330) SpO2:  [94 %-99 %] 94 % (01/23 1330) Weight:  [380 lb (172.4 kg)] 380 lb (172.4 kg) (01/22 2214) Physical Exam  Constitutional:  oriented to person, place, and time. appears well-developed and well-nourished. No distress. Obese caucasian female HENT: [place]/AT, PERRLA, no scleral icterus, previous surgical scar to forehead Mouth/Throat: Oropharynx is clear and moist. No oropharyngeal exudate.  Cardiovascular: Normal rate, regular rhythm and normal heart sounds. Exam reveals no gallop and no friction rub.  No murmur heard.  Pulmonary/Chest: Effort normal and breath sounds normal. No respiratory distress.  has no wheezes.  Neck = supple, no nuchal rigidity Abdominal: Soft. Bowel sounds are normal.  exhibits no distension. There is no tenderness.  Lymphadenopathy: no cervical adenopathy. No axillary adenopathy Neurological: alert and oriented to person, place, and time.  Skin: Skin is warm and dry. She has ennumerable scattered subcm papules to bilateral  lower legs. A few coalescing areas on lateral mallelous of left foot. Dorsum of feet spared. 2 lesions toes bilaterally but no lesions of soles. Non blanching some are macules not all are raised. Coalesced are a deep purplish hue. Small punctate lesions 30+ on back of right leg above knee. Some blanching erythema to right ankle appears improved from admit picture (erythema but not lesions) Ext: nonpitting edema bilaterally Psychiatric: a normal mood and affect.  behavior is normal.   LABS: Results for orders placed or performed during the hospital encounter of 11/12/16 (from the past 48 hour(s))  Comprehensive metabolic panel     Status: Abnormal   Collection Time: 11/13/16  2:56 AM  Result Value Ref Range   Sodium 134 (L) 135 - 145 mmol/L   Potassium 3.9 3.5 - 5.1 mmol/L   Chloride 98 (L) 101 - 111 mmol/L   CO2 31 22 - 32 mmol/L   Glucose, Bld 95 65 - 99 mg/dL   BUN 10 6 - 20 mg/dL   Creatinine, Ser 0.77 0.44 -  1.00 mg/dL   Calcium 8.3 (L) 8.9 - 10.3 mg/dL   Total Protein 7.7 6.5 - 8.1 g/dL   Albumin 2.9 (L) 3.5 - 5.0 g/dL   AST 18 15 - 41 U/L   ALT 9 (L) 14 - 54 U/L   Alkaline Phosphatase 68 38 - 126 U/L   Total Bilirubin 0.8 0.3 - 1.2 mg/dL   GFR calc non Af Amer >60 >60 mL/min   GFR calc Af Amer >60 >60 mL/min    Comment: (NOTE) The eGFR has been calculated using the CKD EPI equation. This calculation has not been validated in all clinical situations. eGFR's persistently <60 mL/min signify possible Chronic Kidney Disease.    Anion gap 5 5 - 15  hCG, quantitative, pregnancy     Status: None   Collection Time: 11/13/16  2:56 AM  Result Value Ref Range   hCG, Beta Chain, Quant, S <1 <5 mIU/mL    Comment:          GEST. AGE      CONC.  (mIU/mL)   <=1 WEEK        5 - 50     2 WEEKS       50 - 500     3 WEEKS       100 - 10,000     4 WEEKS     1,000 - 30,000     5 WEEKS     3,500 - 115,000   6-8 WEEKS     12,000 - 270,000    12 WEEKS     15,000 - 220,000        FEMALE AND  NON-PREGNANT FEMALE:     LESS THAN 5 mIU/mL   C-reactive protein     Status: Abnormal   Collection Time: 11/13/16  2:56 AM  Result Value Ref Range   CRP 4.9 (H) <1.0 mg/dL    Comment: Performed at [facility] [address] [place], [place] [address]  Sedimentation rate     Status: Abnormal   Collection Time: 11/13/16  2:56 AM  Result Value Ref Range   Sed Rate 60 (H) 0 - 22 mm/hr  Procalcitonin     Status: None   Collection Time: 11/13/16  2:56 AM  Result Value Ref Range   Procalcitonin <0.10 ng/mL    Comment:        Interpretation: PCT (Procalcitonin) <= 0.5 ng/mL: Systemic infection (sepsis) is not likely. Local bacterial infection is possible. (NOTE)         ICU PCT Algorithm               Non ICU PCT Algorithm    ----------------------------     ------------------------------         PCT < 0.25 ng/mL                 PCT < 0.1 ng/mL     Stopping of antibiotics            Stopping of antibiotics       strongly encouraged.               strongly encouraged.    ----------------------------     ------------------------------       PCT level decrease by               PCT < 0.25 ng/mL       >= 80% from peak PCT       OR PCT  0.25 - 0.5 ng/mL          Stopping of antibiotics                                             encouraged.     Stopping of antibiotics           encouraged.    ----------------------------     ------------------------------       PCT level decrease by              PCT >= 0.25 ng/mL       < 80% from peak PCT        AND PCT >= 0.5 ng/mL            Continuin g antibiotics                                              encouraged.       Continuing antibiotics            encouraged.    ----------------------------     ------------------------------     PCT level increase compared          PCT > 0.5 ng/mL         with peak PCT AND          PCT >= 0.5 ng/mL             Escalation of antibiotics                                          strongly  encouraged.      Escalation of antibiotics        strongly encouraged.   Lactic acid, plasma     Status: None   Collection Time: 11/13/16  3:19 AM  Result Value Ref Range   Lactic Acid, Venous 0.6 0.5 - 1.9 mmol/L  RPR     Status: None   Collection Time: 11/13/16  3:35 AM  Result Value Ref Range   RPR Ser Ql Non Reactive Non Reactive    Comment: (NOTE) Performed At: [facility] [place], [place] [id number] Lindon Romp MD AU:[id number]   T-helper cells (CD4) count (not at [facility])     Status: Abnormal   Collection Time: 11/13/16  3:35 AM  Result Value Ref Range   CD4 T Cell Abs 240 (L) 400 - 2,700 /uL   CD4 % Helper T Cell 21 (L) 33 - 55 %  CBC with Differential/Platelet     Status: Abnormal   Collection Time: 11/13/16  3:35 AM  Result Value Ref Range   WBC 3.2 (L) 4.0 - 10.5 K/uL   RBC 3.49 (L) 3.87 - 5.11 MIL/uL   Hemoglobin 8.2 (L) 12.0 - 15.0 g/dL   HCT 26.4 (L) 36.0 - 46.0 %   MCV 75.6 (L) 78.0 - 100.0 fL   MCH 23.5 (L) 26.0 - 34.0 pg   MCHC 31.1 30.0 - 36.0 g/dL   RDW 16.9 (H) 11.5 - 15.5 %   Platelets 146 (L) 150 - 400 K/uL   Neutrophils Relative % 46 %   Neutro  Abs 1.5 (L) 1.7 - 7.7 K/uL   Lymphocytes Relative 37 %   Lymphs Abs 1.2 0.7 - 4.0 K/uL   Monocytes Relative 15 %   Monocytes Absolute 0.5 0.1 - 1.0 K/uL   Eosinophils Relative 2 %   Eosinophils Absolute 0.1 0.0 - 0.7 K/uL   Basophils Relative 0 %   Basophils Absolute 0.0 0.0 - 0.1 K/uL  Protime-INR     Status: None   Collection Time: 11/13/16  4:20 AM  Result Value Ref Range   Prothrombin Time 13.5 11.4 - 15.2 seconds   INR 1.03   APTT     Status: None   Collection Time: 11/13/16  4:20 AM  Result Value Ref Range   aPTT 27 24 - 36 seconds  Urinalysis, Routine w reflex microscopic     Status: Abnormal   Collection Time: 11/13/16  6:59 AM  Result Value Ref Range   Color, Urine YELLOW YELLOW   APPearance CLEAR CLEAR   Specific Gravity, Urine 1.019 1.005 - 1.030   pH 5.0 5.0  - 8.0   Glucose, UA NEGATIVE NEGATIVE mg/dL   Hgb urine dipstick NEGATIVE NEGATIVE   Bilirubin Urine NEGATIVE NEGATIVE   Ketones, ur NEGATIVE NEGATIVE mg/dL   Protein, ur NEGATIVE NEGATIVE mg/dL   Nitrite NEGATIVE NEGATIVE   Leukocytes, UA TRACE (A) NEGATIVE   RBC / HPF 0-5 0 - 5 RBC/hpf   WBC, UA 6-30 0 - 5 WBC/hpf   Bacteria, UA RARE (A) NONE SEEN   Squamous Epithelial / LPF 0-5 (A) NONE SEEN   Mucous PRESENT   Rapid urine drug screen (hospital performed)     Status: Abnormal   Collection Time: 11/13/16  6:59 AM  Result Value Ref Range   Opiates POSITIVE (A) NONE DETECTED   Cocaine NONE DETECTED NONE DETECTED   Benzodiazepines NONE DETECTED NONE DETECTED   Amphetamines NONE DETECTED NONE DETECTED   Tetrahydrocannabinol NONE DETECTED NONE DETECTED   Barbiturates NONE DETECTED NONE DETECTED    Comment:        DRUG SCREEN FOR MEDICAL PURPOSES ONLY.  IF CONFIRMATION IS NEEDED FOR ANY PURPOSE, NOTIFY LAB WITHIN 5 DAYS.        LOWEST DETECTABLE LIMITS FOR URINE DRUG SCREEN Drug Class       Cutoff (ng/mL) Amphetamine      1000 Barbiturate      200 Benzodiazepine   818 Tricyclics       299 Opiates          300 Cocaine          300 THC              50     MICRO: Blood cx pending IMAGING: Dg Ankle 2 Views Left  Result Date: 11/13/2016 CLINICAL DATA:  Initial evaluation for the acute left ankle pain. EXAM: LEFT ANKLE - 2 VIEW COMPARISON:  None. FINDINGS: No acute fracture or dislocation. Ankle mortise grossly approximated. Degenerative spurring noted at the dorsal midfoot. Posterior calcaneal enthesophyte noted. Diffuse soft tissue swelling about the lower left leg/ ankle. IMPRESSION: 1. No acute osseous abnormality about the left ankle. 2. Diffuse soft tissue swelling about the lower left leg/ankle. Electronically Signed   By: Jeannine Boga M.D.   On: 11/13/2016 04:59   Assessment/Plan:  31yo F with poorly controlled HIV disease who is admitted for cellulitis with rash  that has appearance of leukocytoclastic vasculitis.  - triggers for vasculitis could be infectious causes such as CMV, EBV, HBV(though she  is immune) and toxo vs. Auto-immune process with p-ANCA, MP-ANCA. Will do some blood tests - sometimes associated with bacteremia. Will follow up on blood cx results -ultimately, it can also be associated with poorly controlled hiv disease though usually there is a co-infection. With starting back on ART,this may help process.  - will start back on ART with genvoya plus darunavir. - will have outreach case manager start paperwork to get free access to HIV meds and labs and clinic visit  - will stop doxy and keep on cefazolin for now. - no need for oi proph since cd 4 count > 200

## 2016-11-13 NOTE — Progress Notes (Signed)
Lovenox per Pharmacy for DVT Prophylaxis    Pharmacy has been consulted from dosing enoxaparin (lovenox) in this patient for DVT prophylaxis.  The pharmacist has reviewed pertinent labs (Hgb __8.2_; PLT__146_), patient weight (_172__kg) and renal function (CrCl_>90__mL/min) and decided that enoxaparin _60_mg SQ Q_24_Hrs is appropriate for this patient.  The pharmacy department will sign off at this time.  Please reconsult pharmacy if status changes or for further issues.  Thank you  Luetta NuttingJulian Crowford Tacoya Altizer, Jr PharmD, BCPS  11/13/2016, 5:30 AM

## 2016-11-13 NOTE — ED Notes (Signed)
EKG given to EDP,Oni,MD., for review. 

## 2016-11-13 NOTE — Telephone Encounter (Signed)
Per Dr Drue SecondSnider called [facility] to have her go see the patient while she is in the hospital to sign her up for ADAP. Marcelino DusterMichelle advised she will go get the paperwork filled out today.

## 2016-11-14 ENCOUNTER — Ambulatory Visit: Payer: Self-pay | Admitting: *Deleted

## 2016-11-14 DIAGNOSIS — Z608 Other problems related to social environment: Secondary | ICD-10-CM

## 2016-11-14 DIAGNOSIS — L03116 Cellulitis of left lower limb: Principal | ICD-10-CM

## 2016-11-14 DIAGNOSIS — I10 Essential (primary) hypertension: Secondary | ICD-10-CM

## 2016-11-14 DIAGNOSIS — L959 Vasculitis limited to the skin, unspecified: Secondary | ICD-10-CM

## 2016-11-14 DIAGNOSIS — B20 Human immunodeficiency virus [HIV] disease: Secondary | ICD-10-CM

## 2016-11-14 DIAGNOSIS — F1910 Other psychoactive substance abuse, uncomplicated: Secondary | ICD-10-CM

## 2016-11-14 DIAGNOSIS — R21 Rash and other nonspecific skin eruption: Secondary | ICD-10-CM

## 2016-11-14 DIAGNOSIS — B9689 Other specified bacterial agents as the cause of diseases classified elsewhere: Secondary | ICD-10-CM

## 2016-11-14 DIAGNOSIS — Z21 Asymptomatic human immunodeficiency virus [HIV] infection status: Secondary | ICD-10-CM

## 2016-11-14 LAB — COMPREHENSIVE METABOLIC PANEL
ALBUMIN: 2.7 g/dL — AB (ref 3.5–5.0)
ALK PHOS: 62 U/L (ref 38–126)
ALT: 8 U/L — ABNORMAL LOW (ref 14–54)
AST: 14 U/L — AB (ref 15–41)
Anion gap: 7 (ref 5–15)
BUN: 8 mg/dL (ref 6–20)
CALCIUM: 8.3 mg/dL — AB (ref 8.9–10.3)
CO2: 27 mmol/L (ref 22–32)
Chloride: 102 mmol/L (ref 101–111)
Creatinine, Ser: 0.76 mg/dL (ref 0.44–1.00)
GFR calc Af Amer: >60 mL/min (ref >60)
GFR calc non Af Amer: >60 mL/min (ref >60)
GLUCOSE: 99 mg/dL (ref 65–99)
POTASSIUM: 3.9 mmol/L (ref 3.5–5.1)
SODIUM: 136 mmol/L (ref 135–145)
Total Bilirubin: 0.3 mg/dL (ref 0.3–1.2)
Total Protein: 6.9 g/dL (ref 6.5–8.1)

## 2016-11-14 LAB — MAGNESIUM: Magnesium: 1.8 mg/dL (ref 1.7–2.4)

## 2016-11-14 LAB — CBC WITH DIFFERENTIAL/PLATELET
BASOS ABS: 0 10*3/uL (ref 0.0–0.1)
BASOS PCT: 0 %
EOS ABS: 0 10*3/uL (ref 0.0–0.7)
Eosinophils Relative: 2 %
HCT: 25.5 % — ABNORMAL LOW (ref 36.0–46.0)
HEMOGLOBIN: 7.9 g/dL — AB (ref 12.0–15.0)
Lymphocytes Relative: 43 %
Lymphs Abs: 1 10*3/uL (ref 0.7–4.0)
MCH: 23.9 pg — ABNORMAL LOW (ref 26.0–34.0)
MCHC: 31 g/dL (ref 30.0–36.0)
MCV: 77 fL — ABNORMAL LOW (ref 78.0–100.0)
MONOS PCT: 13 %
Monocytes Absolute: 0.3 10*3/uL (ref 0.1–1.0)
NEUTROS PCT: 42 %
Neutro Abs: 1 10*3/uL — ABNORMAL LOW (ref 1.7–7.7)
Platelets: 130 10*3/uL — ABNORMAL LOW (ref 150–400)
RBC: 3.31 MIL/uL — ABNORMAL LOW (ref 3.87–5.11)
RDW: 17.3 % — ABNORMAL HIGH (ref 11.5–15.5)
WBC: 2.4 10*3/uL — AB (ref 4.0–10.5)

## 2016-11-14 LAB — PHOSPHORUS: Phosphorus: 3.7 mg/dL (ref 2.5–4.6)

## 2016-11-14 MED ORDER — DARUNAVIR ETHANOLATE 800 MG PO TABS: 800.0000 mg | ORAL_TABLET | Freq: Every day | ORAL | Status: DC

## 2016-11-14 MED ORDER — CEPHALEXIN 500 MG PO CAPS: 500.0000 mg | ORAL_CAPSULE | Freq: Four times a day (QID) | ORAL | 0 refills | Status: AC

## 2016-11-14 MED ORDER — LIDOCAINE HCL (PF) 2 % IJ SOLN
0.0000 mL | Freq: Once | INTRAMUSCULAR | Status: DC | PRN
  Filled 2016-11-14: qty 20

## 2016-11-14 MED ORDER — ACETAMINOPHEN 500 MG PO TABS: 500.0000 mg | ORAL_TABLET | ORAL | Status: AC | PRN

## 2016-11-14 MED ORDER — CEFAZOLIN SODIUM-DEXTROSE 2-4 GM/100ML-% IV SOLN
2.0000 g | Freq: Three times a day (TID) | INTRAVENOUS | Status: DC
  Administered 2016-11-14: 2 g via INTRAVENOUS
  Filled 2016-11-14 (×2): qty 100

## 2016-11-14 MED ORDER — ELVITEG-COBIC-EMTRICIT-TENOFAF 150-150-200-10 MG PO TABS: 1.0000 | ORAL_TABLET | Freq: Every day | ORAL | Status: DC

## 2016-11-14 NOTE — Progress Notes (Signed)
[facility]    Date of Admission:  11/12/2016   Total days of antibiotics 3        Day 3 cefazolin           ID: Eileen [name] is a 31 y.o. 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 Principal Problem:   Cellulitis Active Problems:   Human immunodeficiency virus (HIV) disease ([facility])   Morbid obesity ([facility])   Essential hypertension   Alcohol abuse   Polysubstance abuse   Tobacco abuse   Rash   Left leg cellulitis    Subjective: +swelling of her legs, tearful " I have to leave to get my kids"  Medications:  .  ceFAZolin (ANCEF) IV  2 g Intravenous Q8H  . darunavir  800 mg Oral Q supper  . elvitegravir-cobicistat-emtricitabine-tenofovir  1 tablet Oral Q supper  . enoxaparin (LOVENOX) injection  80 mg Subcutaneous Q24H  . labetalol  300 mg Oral BID  . nicotine  21 mg Transdermal Daily    Objective: Vital signs in last 24 hours: Temp:  [97.7 F (36.5 C)-98.9 F (37.2 C)] 97.7 F (36.5 C) (01/24 1032) Pulse Rate:  [64-80] 78 (01/24 1032) Resp:  [16-18] 18 (01/24 1032) BP: (116-133)/(57-74) 133/72 (01/24 1032) SpO2:  [97 %-98 %] 98 % (01/24 1032)  Physical Exam  Constitutional:  oriented to person, place, and time, tearful. appears well-developed and well-nourished. No distress.  HENT: [place]/AT, PERRLA, no scleral icterus Mouth/Throat: Oropharynx is clear and moist. No oropharyngeal exudate.  Cardiovascular: Normal rate, regular rhythm and normal heart sounds. Exam reveals no gallop and no friction rub.  No murmur heard.  Pulmonary/Chest: Effort normal and breath sounds normal. No respiratory distress.  has no wheezes.  Neck = supple, no nuchal rigidity Abdominal: Soft. Bowel sounds are normal.  exhibits no distension. There is no tenderness.  Neurological: alert and oriented to person, place, and time.  Skin: slightly less erythema noted to skin lesions, some are palpable  non blanching subcm lesions. Erythema to left leg improved Ext: +1 edema Psychiatric: crying this afternoon    Lab Results  Recent Labs  11/13/16 0256 11/13/16 0335 11/14/16 0451  WBC  --  3.2* 2.4*  HGB  --  8.2* 7.9*  HCT  --  26.4* 25.5*  NA 134*  --  136  K 3.9  --  3.9  CL 98*  --  102  CO2 31  --  27  BUN 10  --  8  CREATININE 0.77  --  0.76   Liver Panel  Recent Labs  11/13/16 0256 11/14/16 0451  PROT 7.7 6.9  ALBUMIN 2.9* 2.7*  AST 18 14*  ALT 9* 8*  ALKPHOS 68 62  BILITOT 0.8 0.3   Sedimentation Rate  Recent Labs  11/13/16 0256  ESRSEDRATE 60*   C-Reactive Protein  Recent Labs  11/13/16 0256  CRP 4.9*    Microbiology: Blood cx 1/23 ngtd Studies/Results: Dg Ankle 2 Views Left  Result Date: 11/13/2016 CLINICAL DATA:  Initial evaluation for the acute left ankle pain. EXAM: LEFT ANKLE - 2 VIEW COMPARISON:  None. FINDINGS: No acute fracture or dislocation. Ankle mortise grossly approximated. Degenerative spurring noted at the dorsal midfoot. Posterior calcaneal enthesophyte noted. Diffuse soft tissue swelling about the lower left leg/ ankle. IMPRESSION: 1. No acute osseous abnormality about the left ankle. 2. Diffuse soft tissue swelling about the lower left leg/ankle. Electronically Signed   By: Sharlet SalinaBenjamin  Phill Myron M.D.   On: 11/13/2016 04:59     Assessment/Plan: Small vessel vasculitis/leukocytoclastic vasculitis = labs are pending. Slightly improved, not worsening or any new lesions. ideallyl would like to get skin biopsy. We will see if can do in clinic.  2nd cellulitis to left leg = improving. Finish out a 7 day course with keflex 500mg  QID (needs 4 more days)  hiv disease = she has been started on genvoya plus darunavir. She has 30 day supply, adap application is pending. She will stop by clinic to finish rest of application  Social circumstance = she wants to go home quickly due to concern that she may loose her kids with DIS. Who felt her  current living situation with her mother is not adequate. She is very distraught at hte thought of loosing her children  [facility] Cell: [phone] Pager: [phone]  11/14/2016, 3:29 PM

## 2016-11-14 NOTE — Progress Notes (Signed)
Patient made nurse aware that her guest does not know about her HIV status and would like nursing team to use caution when discussing care / medications.

## 2016-11-14 NOTE — Discharge Summary (Signed)
Physician Discharge Summary  Eileen [name]:[id number] DOB: [birth year]  PCP: Bobby Rumpf, MD  Admit date: 11/12/2016 Discharge date: 11/14/2016  Recommendations for Outpatient Follow-up:  1. Dr. Bobby Rumpf, ID in 10 days with repeat labs (CBC & BMP). May consider skin biopsy for further evaluation of skin rash, during that visit. Please follow outstanding lab work (Toxoplasma antibody,MPO/PR3, HSV, EBV, CMV IgM, HIV 1RNA quantitative and blood cultures 2) that was sent from the hospital.   Home Health: None Equipment/Devices: None    Discharge Condition: Improved and stable  CODE STATUS: Full  Diet recommendation: Heart healthy diet.  Discharge Diagnoses:  Principal Problem:   Cellulitis Active Problems:   Human immunodeficiency virus (HIV) disease ([place])   Morbid obesity ([place])   Essential hypertension   Alcohol abuse   Polysubstance abuse   Tobacco abuse   Rash   Left leg cellulitis   Brief/Interim Summary: Eileen [name] is a 31 y.o. female with medical history significant of hypertension, hyperlipidemia, hypothyroidism, anxiety, syphilis, HIV (CD4 180, VL 5193 on 02/28/16), polysubstance abuse (alcohol, tobacco), morbid obesity, who presented with rash in legs and the left ankle and leg pain. She noted a rash in both lower extremities 2 days prior to admission and this was burning and itching as per patient. She noted some left ankle swelling. She denied trauma. She denied fever or chills. She tried hydrocortisone cream, Benadryl and antibiotic ointment on the rash without significant improvement. No recent exposure to new lotions, perfumes or medications. Patient had been off of her ART medications due to insurance issues. She was admitted for further evaluation and management. Infectious disease was consulted.  Assessment and plan:  1. Lower extremity cellulitis: Infectious disease was consulted. Empirically started doxycycline was discontinued and she was  continued on cefazolin. Clinically improving. ID has reevaluated today. Patient states that she wants to go home ASAP due to concern that she may lose her kids with DSS who felt that her current living conditions with her mother is not adequate and patient is very distraught at the thought of losing her children. As discussed with ID, DC on oral Keflex to complete total 7 days of treatment and outpatient follow-up with ID in 10 days. X-ray of left leg without acute findings. Blood cultures 2: Negative to date. Lactate normal. Pro calcitonin negative. ESR 60. CRP 4.9. Beta hCG <1. 2. 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 Seems to have slightly improved without worsening or any new lesions. Ideally would like to get a skin biopsy (surgery had been consulted) but patient wishes to leave today ASAP due to reasons indicated above. As discussed with ID, she can be followed in ID clinic in the next 10 days at which time biopsy can be considered. As per ID, triggers for vasculitis could be infectious causes such as CMV, EBV, HBV (though she is immune) and toxoplasmosis versus autoimmune process with p-ANCA, MP-ANCA and it can also be associated with poorly controlled HIV disease. Please see picture of her rash in exam section below.  3. HIV disease: She has been started on Genvoya & Darunavir and ID has provided her with 30 day supply of medications and adap application is pending and patient will stop by the ID clinic to finish rest of the application. As per ID, she has been off of ART for several months, generally is on ART during pregnancies then stops  thereafter. She was seen by Dr. Johnnye Sima in the ID clinic last summer, also has home health outreach nurse checking in on her, last note suggested loss of housing and [employer].UDS positive for opiates. CD4: 240. RPR:  Nonreactive. 4. Essential hypertension: Not on antihypertensives prior to admission and will not be discharged on anything new. Did not have significantly elevated blood pressures on admission. This can be followed up as outpatient. 5. Polysubstance abuse (tobacco & alcohol): Patient stated that she did not drink alcohol for more than one month. Cessation counseled. No withdrawal noted.  6. Hyperlipidemia: On no medications prior to admission. 7. Hypothyroid: Last TSH on 02/29/16:0.844. Not on medications prior to admission. 8. Morbid obesity/Body mass index is 54.52 kg/m. : eventually will need diet, exercise and weight loss. 9. Pancytopenia: At least anemia and leukopenia appear chronic compared to results from May 2017. May be related to HIV disease. No bleeding reported. Follow CBCs as outpatient during ID follow-up.      Discharge Instructions  Discharge Instructions    Call MD for:  extreme fatigue    Complete by:  As directed    Call MD for:  persistant dizziness or light-headedness    Complete by:  As directed    Call MD for:  redness, tenderness, or signs of infection (pain, swelling, redness, odor or green/yellow discharge around incision site)    Complete by:  As directed    Call MD for:  severe uncontrolled pain    Complete by:  As directed    Call MD for:  temperature >100.4    Complete by:  As directed    Diet - low sodium heart healthy    Complete by:  As directed    Increase activity slowly    Complete by:  As directed        Medication List    STOP taking these medications   albuterol 108 (90 Base) MCG/ACT inhaler Commonly known as:  PROVENTIL HFA;VENTOLIN HFA     TAKE these medications   acetaminophen 500 MG tablet Commonly known as:  TYLENOL Take 1 tablet (500 mg total) by mouth every 4 (four) hours as needed for mild pain, moderate pain, fever or headache. What changed:  how much to take  reasons to take this  additional instructions   cephALEXin  500 MG capsule Commonly known as:  KEFLEX Take 1 capsule (500 mg total) by mouth 4 (four) times daily.   Darunavir Ethanolate 800 MG tablet Commonly known as:  PREZISTA Take 1 tablet (800 mg total) by mouth daily with supper. Patient was provided samples by Infectious Disease MD.   diphenhydrAMINE 25 mg capsule Commonly known as:  BENADRYL Take 25 mg by mouth every 6 (six) hours as needed for itching.   elvitegravir-cobicistat-emtricitabine-tenofovir 150-150-200-10 MG Tabs tablet Commonly known as:  GENVOYA Take 1 tablet by mouth daily with supper. Patient was provided samples by Infectious Disease MD.      Follow-up Information    [facility] DEPT. Schedule an appointment as soon as possible for a visit in 5 day(s).   Specialty:  Emergency Medicine Why:  To be seen with repeat labs (CBC & BMP) or if symptoms worsen. Contact information: [place] [phone] [place]       Bobby Rumpf, MD. Schedule an appointment as soon as possible for a visit in 10 day(s).   Specialty:  Infectious Diseases Contact information: [address]  [place] [address] [phone]          Allergies  Allergen Reactions  . Sulfonamide Derivatives Anaphylaxis  . Other Other (See Comments)    Pt states that she is allergic to steroids, but does not know the reaction.    . Other Other (See Comments)    Steroids, unknown reaction  . Sulfa Antibiotics Other (See Comments)    Unknown, Childhood reaction 03/06/16: so far tolerating Bactrim w/o issues    Consultations:  Infectious disease.    Procedures/Studies: Dg Ankle 2 Views Left  Result Date: 11/13/2016 CLINICAL DATA:  Initial evaluation for the acute left ankle pain. EXAM: LEFT ANKLE - 2 VIEW COMPARISON:  None. FINDINGS: No acute fracture or dislocation. Ankle mortise grossly approximated. Degenerative spurring noted at the dorsal  midfoot. Posterior calcaneal enthesophyte noted. Diffuse soft tissue swelling about the lower left leg/ ankle. IMPRESSION: 1. No acute osseous abnormality about the left ankle. 2. Diffuse soft tissue swelling about the lower left leg/ankle. Electronically Signed   By: Jeannine Boga M.D.   On: 11/13/2016 04:59      Subjective: Feels better. No pain in legs reported. Rash improving. Redness and swelling also improving. Anxious to leave due to reasons indicated above. Denies any other complaints.   Discharge Exam:  Vitals:   11/13/16 2017 11/14/16 0424 11/14/16 1026 11/14/16 1032  BP: (!) 116/57 128/74  133/72  Pulse: 80 64 76 78  Resp: '16 18  18  '$ Temp: 98.9 F (37.2 C) 98 F (36.7 C)  97.7 F (36.5 C)  TempSrc: Oral Oral  Axillary  SpO2: 97% 97%  98%  Weight:      Height:        General: Pt lying comfortably in bed & appears in no obvious distress.Moderately built, moderately obese.  Cardiovascular: S1 & S2 heard, RRR, S1/S2 +. No murmurs, rubs, gallops or clicks. No JVD or pedal edema. Respiratory: Clear to auscultation without wheezing, rhonchi or crackles. No increased work of breathing. Abdominal:  Non distended, non tender & soft. No organomegaly or masses appreciated. Normal bowel sounds heard. CNS: Alert and oriented. No focal deficits. Extremities: no edema, no cyanosis. Right leg with no erythema, increased warmth, swelling or tenderness. Minimal patchy erythema around left lateral ankle with associated mild swelling and increased warmth. No tenderness. As per ID, less erythema noted to skin lesions. Skin lesions as per picture below: Some of these lesions are palpable nonblanching subcentimeter lesions. That is coalescing of some of these lesions.   Picture from 11/14/16 (day of discharge).  Pictures below are from 11/12/16 (day of admission).          The results of significant diagnostics from this hospitalization (including imaging, microbiology, ancillary  and laboratory) are listed below for reference.     Microbiology: No results found for this or any previous visit (from the past 240 hour(s)).   Labs:  Basic Metabolic Panel:  Recent Labs Lab 11/13/16 0256 11/14/16 0451  NA 134* 136  K 3.9 3.9  CL 98* 102  CO2 31 27  GLUCOSE 95 99  BUN 10 8  CREATININE 0.77 0.76  CALCIUM 8.3* 8.3*  MG  --  1.8  PHOS  --  3.7   Liver Function Tests:  Recent Labs Lab 11/13/16 0256 11/14/16 0451  AST 18 14*  ALT 9* 8*  ALKPHOS 68 62  BILITOT 0.8 0.3  PROT 7.7 6.9  ALBUMIN 2.9* 2.7*   CBC:  Recent Labs Lab 11/13/16 0335 11/14/16  0451  WBC 3.2* 2.4*  NEUTROABS 1.5* 1.0*  HGB 8.2* 7.9*  HCT 26.4* 25.5*  MCV 75.6* 77.0*  PLT 146* 130*   Urinalysis    Component Value Date/Time   COLORURINE YELLOW 11/13/2016 0659   APPEARANCEUR CLEAR 11/13/2016 0659   LABSPEC 1.019 11/13/2016 0659   PHURINE 5.0 11/13/2016 0659   GLUCOSEU NEGATIVE 11/13/2016 0659   HGBUR NEGATIVE 11/13/2016 0659   HGBUR negative 04/17/2010 1457   [place] 11/13/2016 0659   [place] 11/13/2016 0659   PROTEINUR NEGATIVE 11/13/2016 0659   UROBILINOGEN 0.2 06/02/2015 1447   NITRITE NEGATIVE 11/13/2016 0659   LEUKOCYTESUR TRACE (A) 11/13/2016 0659      Time coordinating discharge: Over 30 minutes  SIGNED:  Vernell Leep, MD, FACP, FHM. [facility] Pager [phone]  If 7PM-7AM, please contact night-coverage [email or web address] Password [facility] 11/14/2016, 3:55 PM

## 2016-11-14 NOTE — Discharge Instructions (Signed)
Cellulitis, Adult Cellulitis is a skin infection. The infected area is usually red and tender. This condition occurs most often in the arms and lower legs. The infection can travel to the muscles, blood, and underlying tissue and become serious. It is very important to get treated for this condition. What are the causes? Cellulitis is caused by bacteria. The bacteria enter through a break in the skin, such as a cut, burn, insect bite, open sore, or crack. What increases the risk? This condition is more likely to occur in people who:  Have a weak defense system (immune system).  Have open wounds on the skin such as cuts, burns, bites, and scrapes. Bacteria can enter the body through these open wounds.  Are older.  Have diabetes.  Have a type of long-lasting (chronic) liver disease (cirrhosis) or kidney disease.  Use IV drugs. What are the signs or symptoms? Symptoms of this condition include:  Redness, streaking, or spotting on the skin.  Swollen area of the skin.  Tenderness or pain when an area of the skin is touched.  Warm skin.  Fever.  Chills.  Blisters. How is this diagnosed? This condition is diagnosed based on a medical history and physical exam. You may also have tests, including:  Blood tests.  Lab tests.  Imaging tests. How is this treated? Treatment for this condition may include:  Medicines, such as antibiotic medicines or antihistamines.  Supportive care, such as rest and application of cold or warm cloths (cold or warm compresses) to the skin.  Hospital care, if the condition is severe. The infection usually gets better within 1-2 days of treatment. Follow these instructions at home:  Take over-the-counter and prescription medicines only as told by your health care provider.  If you were prescribed an antibiotic medicine, take it as told by your health care provider. Do not stop taking the antibiotic even if you start to feel better.  Drink  enough fluid to keep your urine clear or pale yellow.  Do not touch or rub the infected area.  Raise (elevate) the infected area above the level of your heart while you are sitting or lying down.  Apply warm or cold compresses to the affected area as told by your health care provider.  Keep all follow-up visits as told by your health care provider. This is important. These visits let your health care provider make sure a more serious infection is not developing. Contact a health care provider if:  You have a fever.  Your symptoms do not improve within 1-2 days of starting treatment.  Your bone or joint underneath the infected area becomes painful after the skin has healed.  Your infection returns in the same area or another area.  You notice a swollen bump in the infected area.  You develop new symptoms.  You have a general ill feeling (malaise) with muscle aches and pains. Get help right away if:  Your symptoms get worse.  You feel very sleepy.  You develop vomiting or diarrhea that persists.  You notice red streaks coming from the infected area.  Your red area gets larger or turns dark in color. This information is not intended to replace advice given to you by your health care provider. Make sure you discuss any questions you have with your health care provider. Document Released: 07/18/2005 Document Revised: 02/16/2016 Document Reviewed: 08/17/2015 Elsevier Interactive Patient Education  2017 Elsevier Inc.    Rash A rash is a change in the color of the  skin. A rash can also change the way your skin feels. There are many different conditions and factors that can cause a rash. Follow these instructions at home: Pay attention to any changes in your symptoms. Follow these instructions to help with your condition: Medicine  Take or apply over-the-counter and prescription medicines only as told by your health care provider. These may include:  Corticosteroid  cream.  Anti-itch lotions.  Oral antihistamines. Skin Care  Apply cool compresses to the affected areas.  Try taking a bath with:  Epsom salts. Follow the instructions on the packaging. You can get these at your local pharmacy or grocery store.  Baking soda. Pour a small amount into the bath as told by your health care provider.  Colloidal oatmeal. Follow the instructions on the packaging. You can get this at your local pharmacy or grocery store.  Try applying baking soda paste to your skin. Stir water into baking soda until it reaches a paste-like consistency.  Do not scratch or rub your skin.  Avoid covering the rash. Make sure the rash is exposed to air as much as possible. General instructions  Avoid hot showers or baths, which can make itching worse. A cold shower may help.  Avoid scented soaps, detergents, and perfumes. Use gentle soaps, detergents, perfumes, and other cosmetic products.  Avoid any substance that causes your rash. Keep a journal to help track what causes your rash. Write down:  What you eat.  What cosmetic products you use.  What you drink.  What you wear. This includes jewelry.  Keep all follow-up visits as told by your health care provider. This is important. Contact a health care provider if:  You sweat at night.  You lose weight.  You urinate more than normal.  You feel weak.  You vomit.  Your skin or the whites of your eyes look yellow (jaundice).  Your skin:  Tingles.  Is numb.  Your rash:  Does not go away after several days.  Gets worse.  You are:  Unusually thirsty.  More tired than normal.  You have:  New symptoms.  Pain in your abdomen.  A fever.  Diarrhea. Get help right away if:  You develop a rash that covers all or most of your body. The rash may or may not be painful.  You develop blisters that:  Are on top of the rash.  Grow larger or grow together.  Are painful.  Are inside your nose  or mouth.  You develop a rash that:  Looks like purple pinprick-sized spots all over your body.  Has a bull's eye or looks like a target.  Is not related to sun exposure, is red and painful, and causes your skin to peel. This information is not intended to replace advice given to you by your health care provider. Make sure you discuss any questions you have with your health care provider. Document Released: 09/28/2002 Document Revised: 03/13/2016 Document Reviewed: 02/23/2015 Elsevier Interactive Patient Education  2017 Elsevier Inc.    Additional discharge instructions:  Please get your medications reviewed and adjusted by your Primary MD.  Please request your Primary MD to go over all Hospital Tests and Procedure/Radiological results at the follow up, please get all Hospital records sent to your Prim MD by signing hospital release before you go home.  If you had Pneumonia of Lung problems at the Hospital: Please get a 2 view Chest X ray done in 6-8 weeks after hospital discharge or sooner if instructed  by your Primary MD.  If you have Congestive Heart Failure: Please call your Cardiologist or Primary MD anytime you have any of the following symptoms:  1) 3 pound weight gain in 24 hours or 5 pounds in 1 week  2) shortness of breath, with or without a dry hacking cough  3) swelling in the hands, feet or stomach  4) if you have to sleep on extra pillows at night in order to breathe  Follow cardiac low salt diet and 1.5 lit/day fluid restriction.  If you have diabetes Accuchecks 4 times/day, Once in AM empty stomach and then before each meal. Log in all results and show them to your primary doctor at your next visit. If any glucose reading is under 80 or above 300 call your primary MD immediately.  If you have Seizure/Convulsions/Epilepsy: Please do not drive, operate heavy machinery, participate in activities at heights or participate in high speed sports until you have seen  by Primary MD or a Neurologist and advised to do so again.  If you had Gastrointestinal Bleeding: Please ask your Primary MD to check a complete blood count within one week of discharge or at your next visit. Your endoscopic/colonoscopic biopsies that are pending at the time of discharge, will also need to followed by your Primary MD.  Get Medicines reviewed and adjusted. Please take all your medications with you for your next visit with your Primary MD  Please request your Primary MD to go over all hospital tests and procedure/radiological results at the follow up, please ask your Primary MD to get all Hospital records sent to his/her office.  If you experience worsening of your admission symptoms, develop shortness of breath, life threatening emergency, suicidal or homicidal thoughts you must seek medical attention immediately by calling 911 or calling your MD immediately  if symptoms less severe.  You must read complete instructions/literature along with all the possible adverse reactions/side effects for all the Medicines you take and that have been prescribed to you. Take any new Medicines after you have completely understood and accpet all the possible adverse reactions/side effects.   Do not drive or operate heavy machinery when taking Pain medications.   Do not take more than prescribed Pain, Sleep and Anxiety Medications  Special Instructions: If you have smoked or chewed Tobacco  in the last 2 yrs please stop smoking, stop any regular Alcohol  and or any Recreational drug use.  Wear Seat belts while driving.  Please note You were cared for by a hospitalist during your hospital stay. If you have any questions about your discharge medications or the care you received while you were in the hospital after you are discharged, you can call the unit and asked to speak with the hospitalist on call if the hospitalist that took care of you is not available. Once you are discharged, your  primary care physician will handle any further medical issues. Please note that NO REFILLS for any discharge medications will be authorized once you are discharged, as it is imperative that you return to your primary care physician (or establish a relationship with a primary care physician if you do not have one) for your aftercare needs so that they can reassess your need for medications and monitor your lab values.  You can reach the hospitalist office at phone [phone] or fax [phone]   If you do not have a primary care physician, you can call [phone] for a physician referral.

## 2016-11-14 NOTE — Progress Notes (Signed)
Assessment unchanged.  Scripts were given per MD order.  All questions pertaining to D/C we answered.  Pt was D/C'd via wheelchair and accompanied by NT.  Amariyana Heacox L  

## 2016-11-15 LAB — MPO/PR-3 (ANCA) ANTIBODIES: Myeloperoxidase Abs: <9 U/mL (ref 0.0–9.0)

## 2016-11-15 LAB — EPSTEIN-BARR VIRUS VCA ANTIBODY PANEL: EBV NA IgG: >600 U/mL — ABNORMAL HIGH (ref 0.0–17.9)

## 2016-11-15 LAB — CMV IGM: CMV IgM: <30 AU/mL (ref 0.0–29.9)

## 2016-11-15 LAB — TOXOPLASMA ANTIBODIES- IGG AND  IGM
Toxoplasma Antibody- IgM: <3 AU/mL (ref 0.0–7.9)
Toxoplasma IgG Ratio: <3 IU/mL (ref 0.0–7.1)

## 2016-11-15 LAB — HSV(HERPES SIMPLEX VRS) I + II AB-IGM

## 2016-11-16 ENCOUNTER — Telehealth: Payer: Self-pay | Admitting: *Deleted

## 2016-11-18 LAB — CULTURE, BLOOD (ROUTINE X 2)
CULTURE: NO GROWTH
Culture: NO GROWTH

## 2016-11-23 LAB — HIV-1 RNA ULTRAQUANT REFLEX TO GENTYP+
HIV-1 RNA BY PCR: 306000 copies/mL
HIV-1 RNA Quant, Log: 5.486 log10copy/mL

## 2016-11-28 ENCOUNTER — Inpatient Hospital Stay: Payer: Self-pay | Admitting: Infectious Diseases

## 2016-11-29 ENCOUNTER — Inpatient Hospital Stay: Payer: Self-pay | Admitting: Infectious Diseases

## 2016-11-29 NOTE — Telephone Encounter (Signed)
RN ran into Eileen [name] unexpectedly and I asked her what happen because I thought she was going to call me, Eileen [name] stated she call me but I did not recall getting a message or call. Eileen [name] stated after leaving the hospital on Wednesday she went to her mom's home and only removed the oldest 2 children and left the younger 3 with her mom. Eileen [name] stated she only took the oldest two so she could wash their clothes and be sure to send them to school the following day. CPS came back to her mom' home and noted that the younger children were still in the home. CPS took custody of all the children and instructed Eileen [name] that she must find housing and child care if she wants her children back.
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
## 2016-12-01 LAB — REFLEX TO GENOSURE(R) MG

## 2017-03-03 IMAGING — US [facility] FETAL BPP W/O NON-STRESS ADDL GEST
1 series · 13 of 28 positions shown · non-contrast
Comparison: none

[Series 1: us fetal bpp w/o nonstress · non-contrast · 32 acquisitions, 13 frames shown]
[im 2/32]
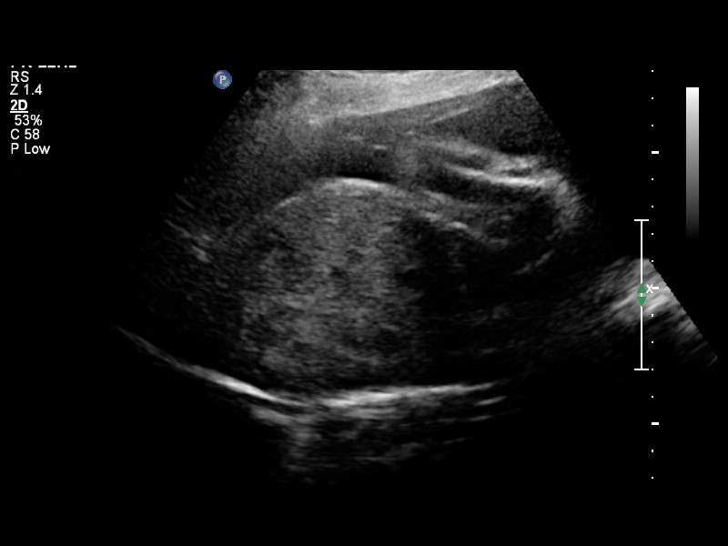
[im 4/32]
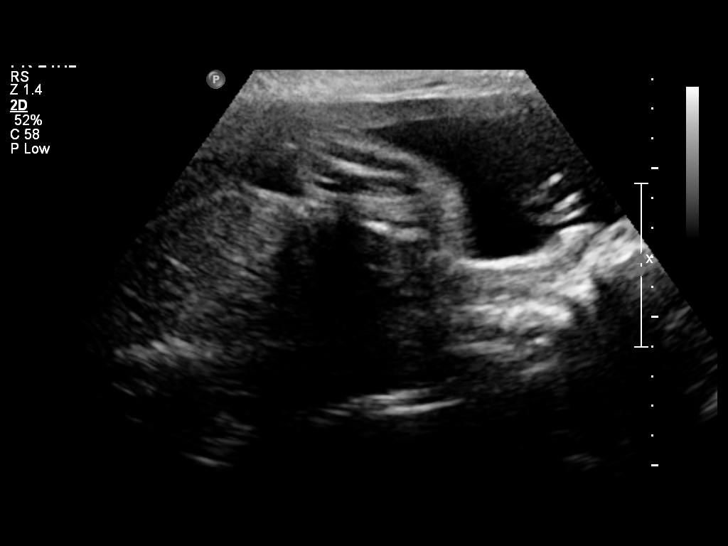
[im 6/32]
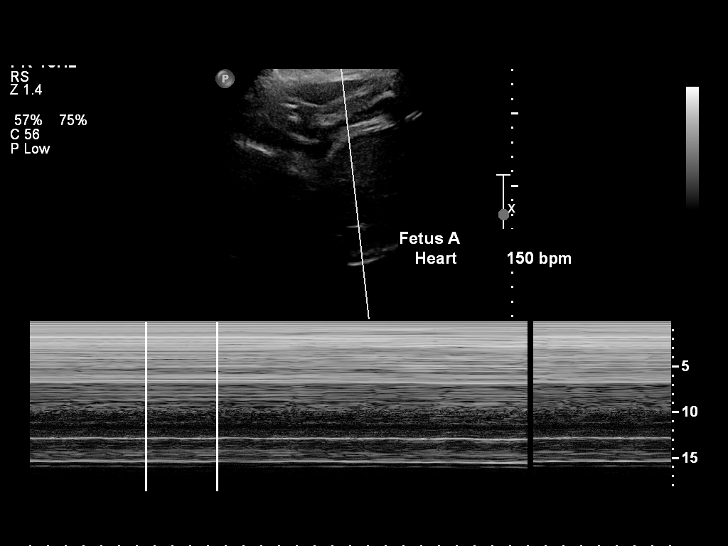
[im 9/32]
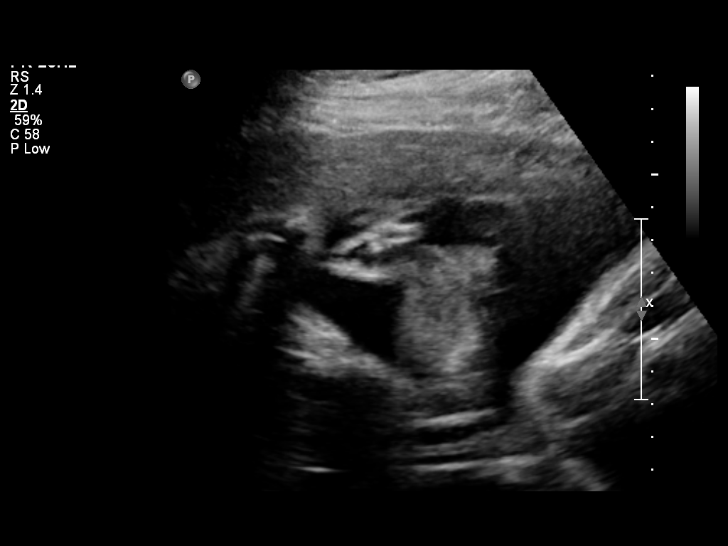
[im 11/32]
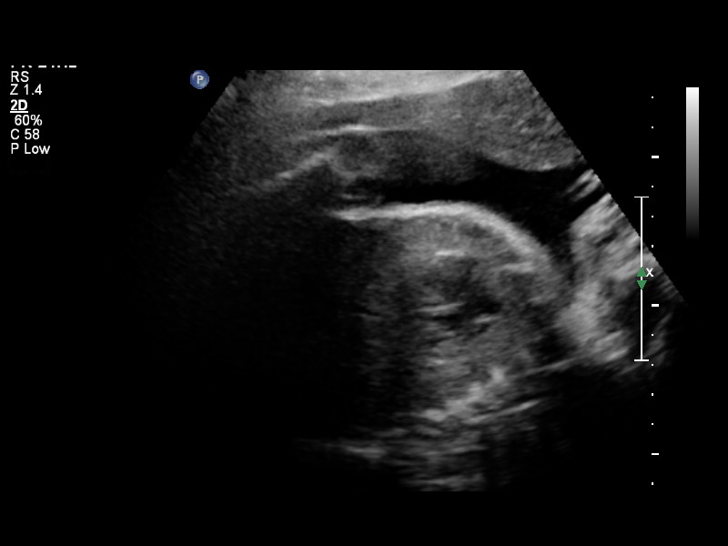
[im 13/32]
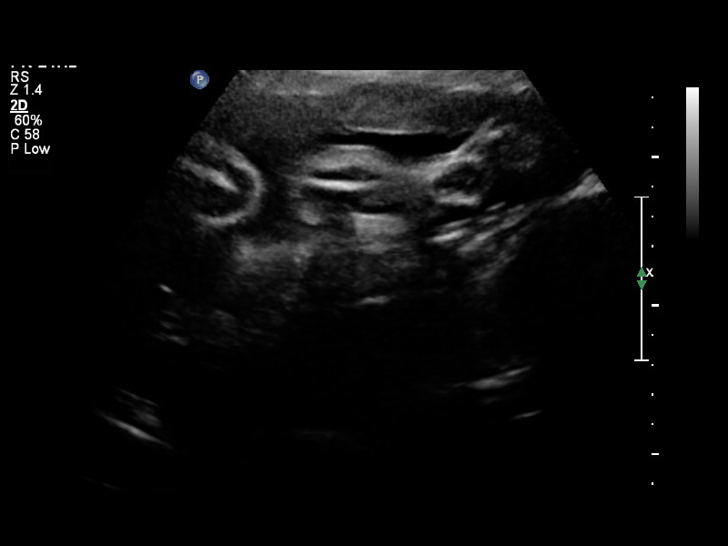
[im 17/32]
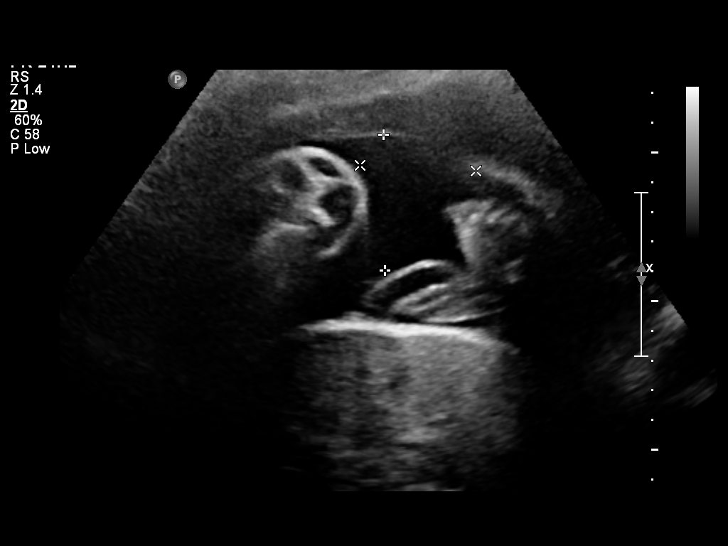
[im 19/32]
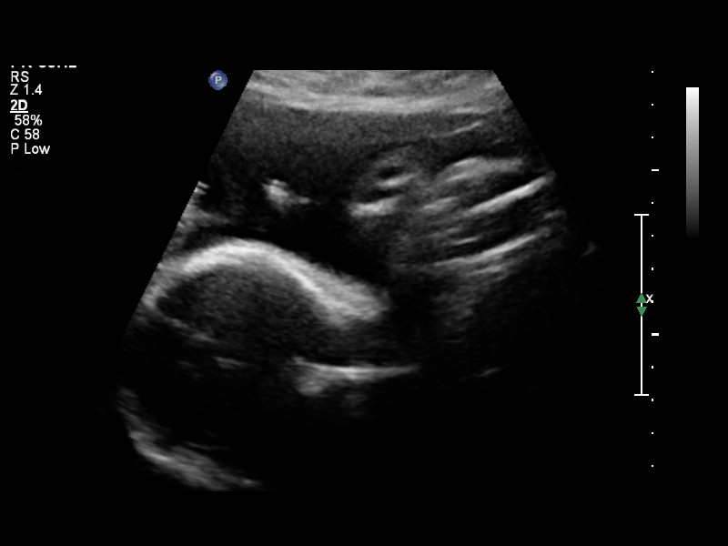
[im 21/32]
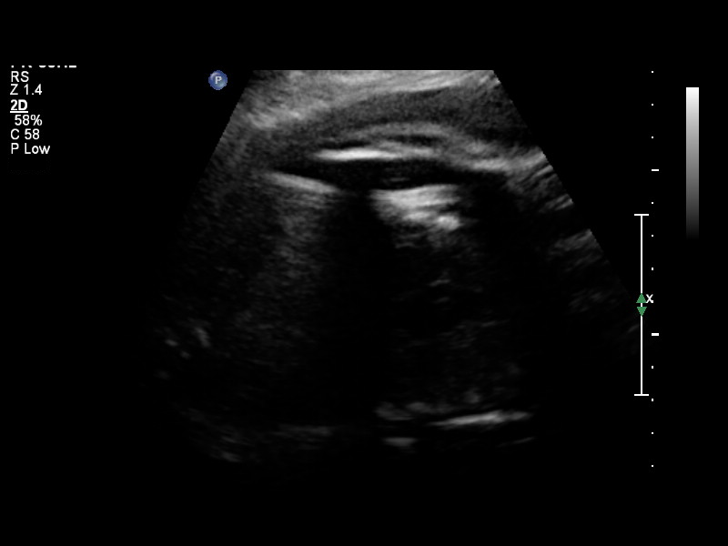
[im 23/32]
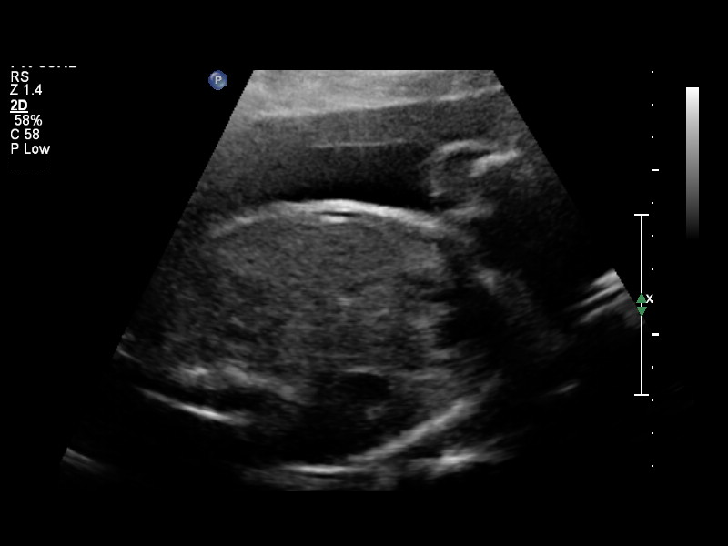
[im 26/32]
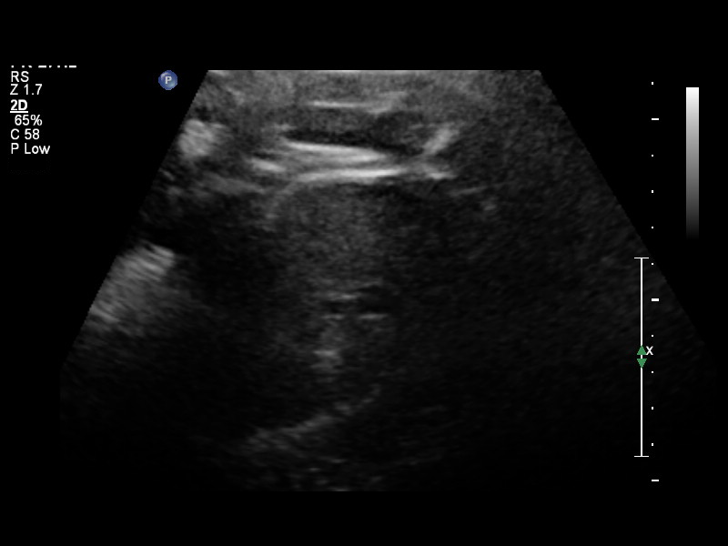
[im 28/32]
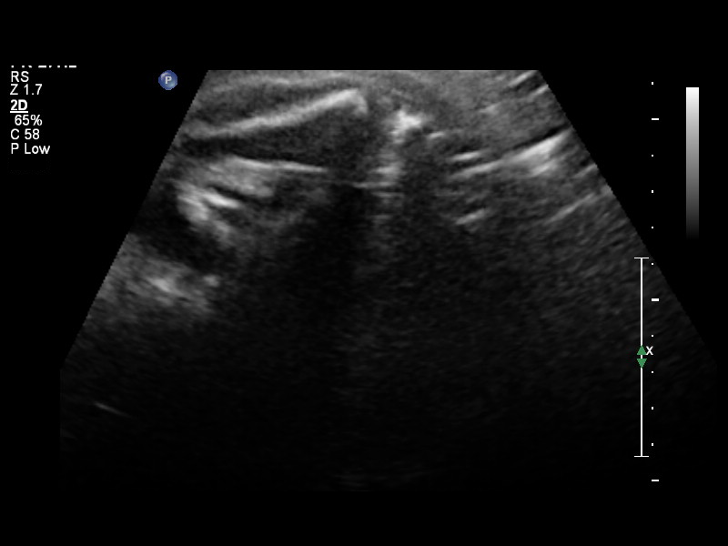
[im 30/32]
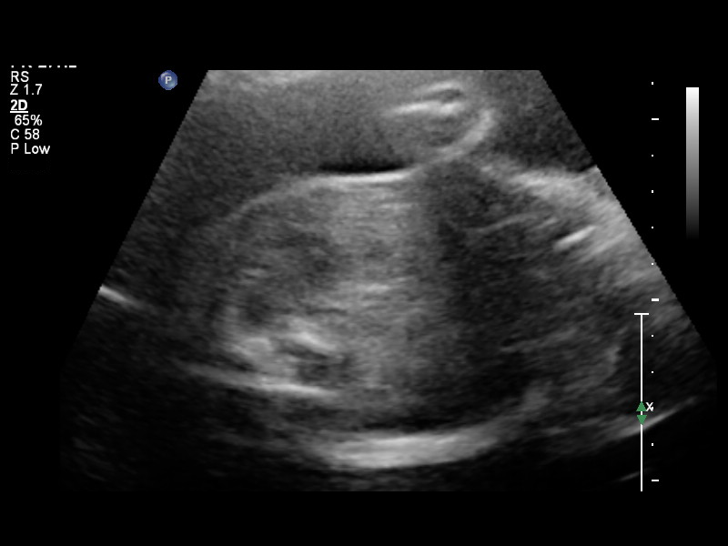

[13 of 28 positions shown; findings below may reference images not displayed]

OBSTETRICS REPORT
(Signed Final 05/03/2015 [DATE])

Service(s) Provided

Indications

HIV: Asymptomatic                                     Z21
Maternal morbid obesity
Twin gestation, Di-Di
Previous cesarean delivery x 3
Poor obstetric history: Previous preeclampsia /
eclampsia/gestational HTN
Poor obstetric history: Previous gestational
diabetes
No or Little Prenatal Care
Uncertain LMP  Establish Gestational Age              Z36
Thyroid disease
Non-reactive NST
32 weeks gestation of pregnancy
Fetal Evaluation (Fetus A)

Num Of Fetuses:    2
Fetal Heart Rate:  150                          bpm
Cardiac Activity:  Observed
Fetal Lie:         Lower Fetus
Presentation:      Transverse, head to
maternal right

Amniotic Fluid
AFI FV:      Subjectively within normal limits
Larg Pckt:     3.6  cm
Biophysical Evaluation (Fetus A)

Amniotic F.V:   Within normal limits       F. Tone:        Observed
F. Movement:    Observed                   Score:          [DATE]
F. Breathing:   Not Observed
Gestational Age (Fetus A)

Best:          32w 1d     Det. By:  U/S Fetus B (03/28/15)   EDD:   06/26/15
Fetal Evaluation (Fetus B)
Num Of Fetuses:    2
Fetal Heart Rate:  155                          bpm
Cardiac Activity:  Observed
Fetal Lie:         Upper Fetus
Presentation:      Transverse, head to
maternal left

Amniotic Fluid
AFI FV:      Subjectively within normal limits
Larg Pckt:     4.6  cm
Biophysical Evaluation (Fetus B)

Amniotic F.V:   Within normal limits       F. Tone:        Observed
F. Movement:    Observed                   Score:          [DATE]
F. Breathing:   Not Observed
Gestational Age (Fetus B)

Best:          32w 1d     Det. By:  U/S Fetus B (03/28/15)   EDD:   06/26/15
Impression

DC/DA twin gestation at 32w 1d

Twin A:
Transverse, fetal head to maternal right
BPP [DATE] (absent fetal breathing movement)
Normal amniotic fluid volume

Twin B:
Transverse, fetal head to maternal left
BPP [DATE] (absent fetal breathing movement)
Normal amniotic fluid volume
Recommendations

Please coorleate with NST
If non-reactive, recommend repeat BPP tomorrow AM.

## 2017-07-29 ENCOUNTER — Other Ambulatory Visit: Payer: Self-pay

## 2017-07-29 ENCOUNTER — Other Ambulatory Visit: Payer: Self-pay | Admitting: Infectious Diseases

## 2017-07-29 DIAGNOSIS — B20 Human immunodeficiency virus [HIV] disease: Secondary | ICD-10-CM

## 2017-07-30 ENCOUNTER — Encounter: Payer: Self-pay | Admitting: Infectious Diseases

## 2017-07-30 LAB — CBC WITH DIFFERENTIAL/PLATELET
BASOS ABS: 22 {cells}/uL (ref 0–200)
Basophils Relative: 0.5 %
EOS PCT: 1.6 %
Eosinophils Absolute: 70 cells/uL (ref 15–500)
HEMATOCRIT: 34.5 % — AB (ref 35.0–45.0)
Hemoglobin: 11.2 g/dL — ABNORMAL LOW (ref 11.7–15.5)
Lymphs Abs: 1043 cells/uL (ref 850–3900)
MCH: 25.2 pg — ABNORMAL LOW (ref 27.0–33.0)
MCHC: 32.5 g/dL (ref 32.0–36.0)
MCV: 77.5 fL — ABNORMAL LOW (ref 80.0–100.0)
MONOS PCT: 8.1 %
MPV: 9.6 fL (ref 7.5–12.5)
NEUTROS PCT: 66.1 %
Neutro Abs: 2908 cells/uL (ref 1500–7800)
Platelets: 149 10*3/uL (ref 140–400)
RBC: 4.45 10*6/uL (ref 3.80–5.10)
RDW: 16.2 % — ABNORMAL HIGH (ref 11.0–15.0)
Total Lymphocyte: 23.7 %
WBC mixed population: 356 cells/uL (ref 200–950)
WBC: 4.4 10*3/uL (ref 3.8–10.8)

## 2017-07-30 LAB — COMPREHENSIVE METABOLIC PANEL
AG Ratio: 1 (calc) (ref 1.0–2.5)
ALKALINE PHOSPHATASE (APISO): 73 U/L (ref 33–115)
ALT: 15 U/L (ref 6–29)
AST: 13 U/L (ref 10–30)
Albumin: 3.4 g/dL — ABNORMAL LOW (ref 3.6–5.1)
BUN: 15 mg/dL (ref 7–25)
CHLORIDE: 107 mmol/L (ref 98–110)
CO2: 24 mmol/L (ref 20–32)
CREATININE: 0.79 mg/dL (ref 0.50–1.10)
Calcium: 8 mg/dL — ABNORMAL LOW (ref 8.6–10.2)
GLUCOSE: 96 mg/dL (ref 65–99)
Globulin: 3.5 g/dL (calc) (ref 1.9–3.7)
Potassium: 3.5 mmol/L (ref 3.5–5.3)
Sodium: 140 mmol/L (ref 135–146)
Total Bilirubin: 0.2 mg/dL (ref 0.2–1.2)
Total Protein: 6.9 g/dL (ref 6.1–8.1)

## 2017-07-30 LAB — T-HELPER CELL (CD4) - (RCID CLINIC ONLY)
CD4 T CELL ABS: 340 /uL — AB (ref 400–2700)
CD4 T CELL HELPER: 28 % — AB (ref 33–55)

## 2017-07-30 LAB — SPECIMEN COMPROMISED

## 2017-07-30 LAB — RPR: RPR Ser Ql: NONREACTIVE

## 2017-08-14 LAB — RFLX HIV-1 INTEGRASE GENOTYPE: HIV-1 GENOTYPE: DETECTED — AB

## 2017-08-14 LAB — HIV-1 RNA ULTRAQUANT REFLEX TO GENTYP+
HIV 1 RNA QUANT: 208000 {copies}/mL — AB
HIV-1 RNA Quant, Log: 5.32 Log cps/mL — ABNORMAL HIGH

## 2017-08-29 ENCOUNTER — Ambulatory Visit: Payer: Self-pay | Admitting: Infectious Diseases

## 2017-09-09 ENCOUNTER — Ambulatory Visit (INDEPENDENT_AMBULATORY_CARE_PROVIDER_SITE_OTHER): Payer: Self-pay | Admitting: Infectious Diseases

## 2017-09-09 ENCOUNTER — Encounter: Payer: Self-pay | Admitting: Infectious Diseases

## 2017-09-09 DIAGNOSIS — F1010 Alcohol abuse, uncomplicated: Secondary | ICD-10-CM

## 2017-09-09 DIAGNOSIS — B20 Human immunodeficiency virus [HIV] disease: Secondary | ICD-10-CM

## 2017-09-09 NOTE — Assessment & Plan Note (Signed)
etoh every 2-3 weeks.

## 2017-09-09 NOTE — Assessment & Plan Note (Signed)
Encouraged her wt loss Exercise, watch diet.

## 2017-09-09 NOTE — Assessment & Plan Note (Signed)
Will recehck her HIV RNA and genotype Refuses flu shot.  Offered/refused condoms.  Last PAP was 2016 Needs dental
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
## 2017-09-11 ENCOUNTER — Telehealth: Payer: Self-pay | Admitting: Licensed Clinical Social Worker

## 2017-09-11 NOTE — Telephone Encounter (Signed)
[facility] left message, introduced self and requested a return phone call.  Eileen AlbertsSherry Chamya [name], [facility]

## 2017-09-19 LAB — HIV-1 INTEGRASE GENOTYPE

## 2017-09-19 LAB — HIV-1 RNA ULTRAQUANT REFLEX TO GENTYP+
HIV 1 RNA Quant: 3360 Copies/mL — ABNORMAL HIGH
HIV-1 RNA Quant, Log: 3.53 Log cps/mL — ABNORMAL HIGH

## 2017-09-19 LAB — RFLX HIV-1 INTEGRASE GENOTYPE: HIV-1 GENOTYPE: DETECTED — AB

## 2017-10-07 ENCOUNTER — Ambulatory Visit: Payer: Self-pay | Admitting: Infectious Diseases

## 2017-11-05 ENCOUNTER — Ambulatory Visit: Payer: Self-pay | Admitting: Infectious Diseases

## 2017-11-06 ENCOUNTER — Ambulatory Visit (INDEPENDENT_AMBULATORY_CARE_PROVIDER_SITE_OTHER): Payer: Self-pay | Admitting: Infectious Diseases

## 2017-11-06 VITALS — BP 151/86 | HR 86 | Temp 98.7°F | Wt 370.0 lb

## 2017-11-06 DIAGNOSIS — I10 Essential (primary) hypertension: Secondary | ICD-10-CM

## 2017-11-06 DIAGNOSIS — F1910 Other psychoactive substance abuse, uncomplicated: Secondary | ICD-10-CM

## 2017-11-06 DIAGNOSIS — B20 Human immunodeficiency virus [HIV] disease: Secondary | ICD-10-CM

## 2017-11-06 DIAGNOSIS — Z23 Encounter for immunization: Secondary | ICD-10-CM

## 2017-11-06 DIAGNOSIS — Z79899 Other long term (current) drug therapy: Secondary | ICD-10-CM

## 2017-11-06 DIAGNOSIS — Z113 Encounter for screening for infections with a predominantly sexual mode of transmission: Secondary | ICD-10-CM

## 2017-11-06 MED ORDER — DARUNAVIR ETHANOLATE 800 MG PO TABS: 800.0000 mg | ORAL_TABLET | Freq: Every day | ORAL | 3 refills | Status: AC

## 2017-11-06 MED ORDER — ELVITEG-COBIC-EMTRICIT-TENOFAF 150-150-200-10 MG PO TABS: 1.0000 | ORAL_TABLET | Freq: Every day | ORAL | 3 refills | Status: AC

## 2017-11-06 NOTE — Assessment & Plan Note (Signed)
Will refill her prezista-genvoya.  Flu shot today Mening today Check her labs today Get her PAP See her back in 3 months.

## 2017-11-06 NOTE — Assessment & Plan Note (Signed)
Greatly appreciate f/u at [facility]

## 2017-11-06 NOTE — Assessment & Plan Note (Signed)
Cont to be slightly up Encourage wt loss, exercise.
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
## 2017-11-06 NOTE — Addendum Note (Signed)
Addended by: Lorenso CourierMALDONADO, Tayonna Bacha L on: 11/06/2017 11:36 AM   Modules accepted: Orders

## 2017-11-06 NOTE — Addendum Note (Signed)
Addended by: Mariea ClontsGREEN, Elsye Mccollister D on: 11/06/2017 02:10 PM   Modules accepted: Orders

## 2017-11-07 LAB — CBC
HEMATOCRIT: 31.2 % — AB (ref 35.0–45.0)
HEMOGLOBIN: 9.6 g/dL — AB (ref 11.7–15.5)
MCH: 23.6 pg — AB (ref 27.0–33.0)
MCHC: 30.8 g/dL — ABNORMAL LOW (ref 32.0–36.0)
MCV: 76.7 fL — AB (ref 80.0–100.0)
MPV: 9.4 fL (ref 7.5–12.5)
PLATELETS: 128 10*3/uL — AB (ref 140–400)
RBC: 4.07 10*6/uL (ref 3.80–5.10)
RDW: 14.5 % (ref 11.0–15.0)
WBC: 3.6 10*3/uL — ABNORMAL LOW (ref 3.8–10.8)

## 2017-11-07 LAB — COMPREHENSIVE METABOLIC PANEL
AG Ratio: 0.9 (calc) — ABNORMAL LOW (ref 1.0–2.5)
ALT: 7 U/L (ref 6–29)
AST: 16 U/L (ref 10–30)
Albumin: 3.6 g/dL (ref 3.6–5.1)
Alkaline phosphatase (APISO): 61 U/L (ref 33–115)
BUN: 16 mg/dL (ref 7–25)
CHLORIDE: 100 mmol/L (ref 98–110)
CO2: 30 mmol/L (ref 20–32)
CREATININE: 0.7 mg/dL (ref 0.50–1.10)
Calcium: 9 mg/dL (ref 8.6–10.2)
GLOBULIN: 4.1 g/dL — AB (ref 1.9–3.7)
GLUCOSE: 84 mg/dL (ref 65–99)
Potassium: 4.3 mmol/L (ref 3.5–5.3)
Sodium: 135 mmol/L (ref 135–146)
Total Bilirubin: 0.4 mg/dL (ref 0.2–1.2)
Total Protein: 7.7 g/dL (ref 6.1–8.1)

## 2017-11-07 LAB — LIPID PANEL
CHOL/HDL RATIO: 4.2 (calc) (ref <5.0)
Cholesterol: 156 mg/dL (ref <200)
HDL: 37 mg/dL — AB (ref >50)
LDL CHOLESTEROL (CALC): 99 mg/dL
NON-HDL CHOLESTEROL (CALC): 119 mg/dL (ref <130)
TRIGLYCERIDES: 102 mg/dL (ref <150)

## 2017-11-07 LAB — RPR: RPR Ser Ql: NONREACTIVE

## 2017-11-07 LAB — T-HELPER CELL (CD4) - (RCID CLINIC ONLY)
CD4 % Helper T Cell: 18 % — ABNORMAL LOW (ref 33–55)
CD4 T Cell Abs: 260 /uL — ABNORMAL LOW (ref 400–2700)

## 2017-11-08 LAB — HIV-1 RNA QUANT-NO REFLEX-BLD
HIV 1 RNA Quant: 1870 copies/mL — ABNORMAL HIGH
HIV-1 RNA QUANT, LOG: 3.27 {Log_copies}/mL — AB

## 2018-01-06 ENCOUNTER — Other Ambulatory Visit: Payer: Self-pay

## 2018-01-20 ENCOUNTER — Ambulatory Visit: Payer: Self-pay | Admitting: Infectious Diseases

## 2018-03-12 ENCOUNTER — Telehealth: Payer: Self-pay

## 2018-03-12 NOTE — Telephone Encounter (Signed)
Called pt to schedule a f/u with Dr. Hatcher waNinetta Lightsable to reach the pt. Left a vm for pt to call us back to make an appt. Lorenso Courier, [place]

## 2018-06-14 ENCOUNTER — Encounter (HOSPITAL_COMMUNITY): Payer: Self-pay | Admitting: Emergency Medicine

## 2018-06-14 ENCOUNTER — Emergency Department (HOSPITAL_COMMUNITY)
Admission: EM | Admit: 2018-06-14 | Discharge: 2018-06-14 | Disposition: A | Discharge status: Home / Self Care | Payer: Medicaid Other | Attending: Emergency Medicine | Admitting: Emergency Medicine

## 2018-06-14 ENCOUNTER — Other Ambulatory Visit: Payer: Self-pay

## 2018-06-14 DIAGNOSIS — F1120 Opioid dependence, uncomplicated: Secondary | ICD-10-CM

## 2018-06-14 DIAGNOSIS — I10 Essential (primary) hypertension: Secondary | ICD-10-CM | POA: Insufficient documentation

## 2018-06-14 DIAGNOSIS — F17210 Nicotine dependence, cigarettes, uncomplicated: Secondary | ICD-10-CM | POA: Insufficient documentation

## 2018-06-14 DIAGNOSIS — F19230 Other psychoactive substance dependence with withdrawal, uncomplicated: Secondary | ICD-10-CM | POA: Insufficient documentation

## 2018-06-14 DIAGNOSIS — B20 Human immunodeficiency virus [HIV] disease: Secondary | ICD-10-CM | POA: Insufficient documentation

## 2018-06-14 DIAGNOSIS — Z79899 Other long term (current) drug therapy: Secondary | ICD-10-CM | POA: Insufficient documentation

## 2018-06-14 DIAGNOSIS — E039 Hypothyroidism, unspecified: Secondary | ICD-10-CM | POA: Insufficient documentation

## 2018-06-14 LAB — RAPID URINE DRUG SCREEN, HOSP PERFORMED
AMPHETAMINES: NOT DETECTED
BENZODIAZEPINES: NOT DETECTED
Barbiturates: NOT DETECTED
COCAINE: POSITIVE — AB
OPIATES: POSITIVE — AB
Tetrahydrocannabinol: NOT DETECTED

## 2018-06-14 NOTE — ED Triage Notes (Signed)
When Pt asked to collect  UDS ,pt reported I am so sleepy.  Pt reports unable to pee and requested somehting to drink.
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
## 2018-06-14 NOTE — ED Notes (Signed)
Spoke to EDP and PA about patient.  States appropriate for one touch and no further work up required at this time until she is spoken to by a Administrator, artsphysican

## 2018-06-14 NOTE — ED Triage Notes (Signed)
Pt presents to ED for assessment as [facility] and they will not let her dose until she can pass a UDS.  Patient states she is having withdrawal symptoms, such chronic pain return, runny nose, emesis, and increasing feelings of depression (denies SI/HI).  Patient states she cannot see the doctor to refill her anti-depressants.

## 2018-06-14 NOTE — Discharge Instructions (Addendum)
Please avoid drug use.  You will need to follow up with your PCP

## 2018-06-14 NOTE — ED Notes (Signed)
Pt aware of need for urine specimen, specimen cup at bedside, cup of water provided and at bedside

## 2018-09-03 ENCOUNTER — Telehealth: Payer: Self-pay

## 2018-09-03 NOTE — Telephone Encounter (Signed)
Patient is on overdue pap list. Need to ask patient if she has had a pap smear done recently. If not patient is able to schedule a pap appointment with Rexene AlbertsStephanie Dixon, Np. Attempted to call patient, however, patient is not able to take my call. Asked that patient call office back. Patient also needs to schedule lab and follow- up appointment with Dr. Ninetta LightsHatcher. Lorenso CourierJose L Maldonado, [place]

## 2019-03-30 ENCOUNTER — Telehealth (HOSPITAL_COMMUNITY): Payer: Self-pay | Admitting: Psychology

## 2019-03-31 ENCOUNTER — Telehealth (HOSPITAL_COMMUNITY): Payer: Self-pay | Admitting: Psychiatry

## 2019-06-19 DIAGNOSIS — B029 Zoster without complications: Secondary | ICD-10-CM | POA: Insufficient documentation

## 2019-06-19 DIAGNOSIS — I10 Essential (primary) hypertension: Secondary | ICD-10-CM | POA: Insufficient documentation

## 2019-06-19 DIAGNOSIS — B20 Human immunodeficiency virus [HIV] disease: Secondary | ICD-10-CM | POA: Insufficient documentation

## 2019-06-19 DIAGNOSIS — F17210 Nicotine dependence, cigarettes, uncomplicated: Secondary | ICD-10-CM | POA: Insufficient documentation

## 2019-06-19 DIAGNOSIS — E039 Hypothyroidism, unspecified: Secondary | ICD-10-CM | POA: Insufficient documentation

## 2019-06-20 ENCOUNTER — Encounter (HOSPITAL_COMMUNITY): Payer: Self-pay | Admitting: Emergency Medicine

## 2019-06-20 ENCOUNTER — Other Ambulatory Visit: Payer: Self-pay

## 2019-06-20 ENCOUNTER — Emergency Department (HOSPITAL_COMMUNITY)
Admission: EM | Admit: 2019-06-20 | Discharge: 2019-06-20 | Disposition: A | Discharge status: Home / Self Care | Payer: Medicaid Other | Attending: Emergency Medicine | Admitting: Emergency Medicine

## 2019-06-20 DIAGNOSIS — B029 Zoster without complications: Secondary | ICD-10-CM

## 2019-06-20 LAB — CBC WITH DIFFERENTIAL/PLATELET
Abs Immature Granulocytes: 0.01 10*3/uL (ref 0.00–0.07)
Basophils Absolute: 0 10*3/uL (ref 0.0–0.1)
Basophils Relative: 0 %
Eosinophils Absolute: 0.1 10*3/uL (ref 0.0–0.5)
Eosinophils Relative: 2 %
HCT: 37 % (ref 36.0–46.0)
Hemoglobin: 11.3 g/dL — ABNORMAL LOW (ref 12.0–15.0)
Immature Granulocytes: 0 %
Lymphocytes Relative: 28 %
Lymphs Abs: 0.9 10*3/uL (ref 0.7–4.0)
MCH: 24.6 pg — ABNORMAL LOW (ref 26.0–34.0)
MCHC: 30.5 g/dL (ref 30.0–36.0)
MCV: 80.6 fL (ref 80.0–100.0)
Monocytes Absolute: 0.3 10*3/uL (ref 0.1–1.0)
Monocytes Relative: 10 %
Neutro Abs: 1.9 10*3/uL (ref 1.7–7.7)
Neutrophils Relative %: 60 %
Platelets: 146 10*3/uL — ABNORMAL LOW (ref 150–400)
RBC: 4.59 MIL/uL (ref 3.87–5.11)
RDW: 14.5 % (ref 11.5–15.5)
WBC: 3.1 10*3/uL — ABNORMAL LOW (ref 4.0–10.5)
nRBC: 0 % (ref 0.0–0.2)

## 2019-06-20 MED ORDER — PREDNISONE 20 MG PO TABS: ORAL_TABLET | ORAL | 0 refills | Status: AC

## 2019-06-20 MED ORDER — IBUPROFEN 800 MG PO TABS: 800.0000 mg | ORAL_TABLET | Freq: Three times a day (TID) | ORAL | 0 refills | Status: AC

## 2019-06-20 MED ORDER — ACYCLOVIR 800 MG PO TABS: 800.0000 mg | ORAL_TABLET | Freq: Every day | ORAL | 0 refills | Status: AC

## 2019-06-20 MED ORDER — GABAPENTIN 100 MG PO CAPS
100.0000 mg | ORAL_CAPSULE | Freq: Once | ORAL | Status: AC
  Administered 2019-06-20: 100 mg via ORAL
  Filled 2019-06-20: qty 1

## 2019-06-20 MED ORDER — KETOROLAC TROMETHAMINE 60 MG/2ML IM SOLN
60.0000 mg | Freq: Once | INTRAMUSCULAR | Status: AC
  Administered 2019-06-20: 60 mg via INTRAMUSCULAR
  Filled 2019-06-20: qty 2

## 2019-06-20 MED ORDER — ACYCLOVIR 200 MG PO CAPS: 200.0000 mg | ORAL_CAPSULE | Freq: Once | ORAL | Status: DC

## 2019-06-20 MED ORDER — GABAPENTIN 100 MG PO CAPS: 100.0000 mg | ORAL_CAPSULE | Freq: Three times a day (TID) | ORAL | 0 refills | Status: AC

## 2019-06-20 MED ORDER — PREDNISONE 20 MG PO TABS
60.0000 mg | ORAL_TABLET | Freq: Once | ORAL | Status: AC
  Administered 2019-06-20: 60 mg via ORAL
  Filled 2019-06-20: qty 3

## 2019-06-20 MED ORDER — ACYCLOVIR 200 MG PO CAPS
800.0000 mg | ORAL_CAPSULE | Freq: Once | ORAL | Status: AC
  Administered 2019-06-20: 05:00:00 800 mg via ORAL
  Filled 2019-06-20: qty 4
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
## 2019-06-20 NOTE — ED Triage Notes (Signed)
Pt c/o rash on right chest going around to mid back onset 3 days ago.

## 2019-06-22 ENCOUNTER — Telehealth: Payer: Self-pay | Admitting: *Deleted

## 2019-06-22 NOTE — Telephone Encounter (Signed)
-----   Message from Campbell Riches, MD sent at 06/22/2019  7:51 AM EDT ----- Thanks ! Will work on getting her in ----- Message ----- From: Merrily Pew, MD Sent: 06/20/2019   4:13 AM EDT To: Campbell Riches, MD  Dr. Johnnye Sima, I saw your patient in the ER tonight. She appears to have shingles. She has a history of HIV and hasn't been on on her HAART in a couple years so I sent off a T helper count. This with the shingles (overall appears nontoxic, low concern for disseminated disease) I was hoping you could try to get her into clinic for follow up. She said she would call the office but I wanted to make sure you understood why I was asking her to follow up so quickly. Thanks for your help. Corene Cornea Mesner
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
## 2019-06-23 ENCOUNTER — Telehealth: Payer: Self-pay

## 2019-06-23 NOTE — Telephone Encounter (Signed)
Called patient's phone to schedule appointment with Dr. Johnnye Sima, patient's voice mail is full.

## 2019-06-25 ENCOUNTER — Other Ambulatory Visit: Payer: Medicaid Other

## 2019-06-25 ENCOUNTER — Other Ambulatory Visit: Payer: Self-pay

## 2019-06-25 ENCOUNTER — Ambulatory Visit: Payer: Medicaid Other

## 2019-06-25 ENCOUNTER — Encounter: Payer: Self-pay | Admitting: Infectious Diseases

## 2019-06-25 DIAGNOSIS — B20 Human immunodeficiency virus [HIV] disease: Secondary | ICD-10-CM

## 2019-06-25 DIAGNOSIS — Z113 Encounter for screening for infections with a predominantly sexual mode of transmission: Secondary | ICD-10-CM

## 2019-06-26 LAB — T-HELPER CELL (CD4) - (RCID CLINIC ONLY)
CD4 % Helper T Cell: 17 % — ABNORMAL LOW (ref 33–65)
CD4 T Cell Abs: 172 /uL — ABNORMAL LOW (ref 400–1790)

## 2019-06-27 ENCOUNTER — Telehealth: Payer: Self-pay | Admitting: Internal Medicine

## 2019-06-27 NOTE — Telephone Encounter (Signed)
I was contacted at 5:30 pm roughly on 2019-07-11 from paramedics who reported to Ms Demetrius Charity

## 2019-07-08 LAB — COMPLETE METABOLIC PANEL WITH GFR
AG Ratio: 1.1 (calc) (ref 1.0–2.5)
ALT: 7 U/L (ref 6–29)
AST: 17 U/L (ref 10–30)
Albumin: 3.6 g/dL (ref 3.6–5.1)
Alkaline phosphatase (APISO): 52 U/L (ref 31–125)
BUN: 18 mg/dL (ref 7–25)
CO2: 30 mmol/L (ref 20–32)
Calcium: 8.9 mg/dL (ref 8.6–10.2)
Chloride: 104 mmol/L (ref 98–110)
Creat: 0.97 mg/dL (ref 0.50–1.10)
GFR, Est African American: 88 mL/min/{1.73_m2} (ref >=60)
GFR, Est Non African American: 76 mL/min/{1.73_m2} (ref >=60)
Globulin: 3.3 g/dL (calc) (ref 1.9–3.7)
Glucose, Bld: 88 mg/dL (ref 65–99)
Potassium: 4 mmol/L (ref 3.5–5.3)
Sodium: 138 mmol/L (ref 135–146)
Total Bilirubin: 0.3 mg/dL (ref 0.2–1.2)
Total Protein: 6.9 g/dL (ref 6.1–8.1)

## 2019-07-08 LAB — CBC WITH DIFFERENTIAL/PLATELET
Absolute Monocytes: 247 cells/uL (ref 200–950)
Basophils Absolute: 9 cells/uL (ref 0–200)
Basophils Relative: 0.3 %
Eosinophils Absolute: 41 cells/uL (ref 15–500)
Eosinophils Relative: 1.4 %
HCT: 33.7 % — ABNORMAL LOW (ref 35.0–45.0)
Hemoglobin: 10.5 g/dL — ABNORMAL LOW (ref 11.7–15.5)
Lymphs Abs: 1096 cells/uL (ref 850–3900)
MCH: 24.2 pg — ABNORMAL LOW (ref 27.0–33.0)
MCHC: 31.2 g/dL — ABNORMAL LOW (ref 32.0–36.0)
MCV: 77.6 fL — ABNORMAL LOW (ref 80.0–100.0)
MPV: 9.7 fL (ref 7.5–12.5)
Monocytes Relative: 8.5 %
Neutro Abs: 1508 cells/uL (ref 1500–7800)
Neutrophils Relative %: 52 %
Platelets: 139 10*3/uL — ABNORMAL LOW (ref 140–400)
RBC: 4.34 10*6/uL (ref 3.80–5.10)
RDW: 14.5 % (ref 11.0–15.0)
Total Lymphocyte: 37.8 %
WBC: 2.9 10*3/uL — ABNORMAL LOW (ref 3.8–10.8)

## 2019-07-08 LAB — LIPID PANEL
Cholesterol: 132 mg/dL (ref <200)
HDL: 37 mg/dL — ABNORMAL LOW (ref >=50)
LDL Cholesterol (Calc): 80 mg/dL (calc)
Non-HDL Cholesterol (Calc): 95 mg/dL (calc) (ref <130)
Total CHOL/HDL Ratio: 3.6 (calc) (ref <5.0)
Triglycerides: 73 mg/dL (ref <150)

## 2019-07-08 LAB — RPR: RPR Ser Ql: NONREACTIVE

## 2019-07-08 LAB — HIV-1 RNA ULTRAQUANT REFLEX TO GENTYP+
HIV 1 RNA Quant: 310000 copies/mL — ABNORMAL HIGH
HIV-1 RNA Quant, Log: 5.49 Log copies/mL — ABNORMAL HIGH

## 2019-07-08 LAB — HIV-1 GENOTYPE: HIV-1 Genotype: DETECTED — AB

## 2019-07-09 ENCOUNTER — Ambulatory Visit: Payer: Medicaid Other | Admitting: Infectious Diseases

## 2019-07-09 ENCOUNTER — Ambulatory Visit: Payer: Medicaid Other | Admitting: Pharmacist

## 2019-07-23 DEATH — deceased
